# Patient Record
Sex: Female | Born: 1965
Health system: Southern US, Community
[De-identification: ages and names within clinical notes are randomized; demographics above are authoritative.]

## PROBLEM LIST (undated history)

## (undated) DIAGNOSIS — M109 Gout, unspecified: Secondary | ICD-10-CM

## (undated) DIAGNOSIS — I10 Essential (primary) hypertension: Secondary | ICD-10-CM

## (undated) DIAGNOSIS — K2900 Acute gastritis without bleeding: Secondary | ICD-10-CM

## (undated) DIAGNOSIS — R42 Dizziness and giddiness: Secondary | ICD-10-CM

## (undated) DIAGNOSIS — E119 Type 2 diabetes mellitus without complications: Secondary | ICD-10-CM

## (undated) DIAGNOSIS — E669 Obesity, unspecified: Secondary | ICD-10-CM

## (undated) DIAGNOSIS — G43909 Migraine, unspecified, not intractable, without status migrainosus: Secondary | ICD-10-CM

## (undated) HISTORY — PX: HEMORRHOID SURGERY: SHX153

## (undated) HISTORY — DX: Acute gastritis without bleeding: K29.00

---

## 1998-02-02 ENCOUNTER — Other Ambulatory Visit: Admission: RE | Admit: 1998-02-02 | Discharge: 1998-02-02 | Payer: Self-pay | Admitting: Family Medicine

## 1998-02-22 ENCOUNTER — Encounter: Admission: RE | Admit: 1998-02-22 | Discharge: 1998-02-22 | Payer: Self-pay | Admitting: Family Medicine

## 1998-05-08 ENCOUNTER — Encounter: Admission: RE | Admit: 1998-05-08 | Discharge: 1998-05-08 | Payer: Self-pay | Admitting: Sports Medicine

## 1998-05-10 ENCOUNTER — Encounter: Admission: RE | Admit: 1998-05-10 | Discharge: 1998-05-10 | Payer: Self-pay | Admitting: Family Medicine

## 1998-06-01 ENCOUNTER — Encounter: Admission: RE | Admit: 1998-06-01 | Discharge: 1998-06-01 | Payer: Self-pay | Admitting: Sports Medicine

## 1998-07-04 ENCOUNTER — Encounter: Admission: RE | Admit: 1998-07-04 | Discharge: 1998-07-04 | Payer: Self-pay | Admitting: Family Medicine

## 1998-07-10 ENCOUNTER — Encounter: Admission: RE | Admit: 1998-07-10 | Discharge: 1998-07-10 | Payer: Self-pay | Admitting: Sports Medicine

## 1998-07-14 ENCOUNTER — Emergency Department (HOSPITAL_COMMUNITY): Admission: EM | Admit: 1998-07-14 | Discharge: 1998-07-14 | Payer: Self-pay | Admitting: Emergency Medicine

## 1998-07-14 ENCOUNTER — Encounter: Payer: Self-pay | Admitting: Emergency Medicine

## 1998-07-24 ENCOUNTER — Encounter: Admission: RE | Admit: 1998-07-24 | Discharge: 1998-07-24 | Payer: Self-pay | Admitting: Family Medicine

## 1998-09-13 ENCOUNTER — Encounter: Admission: RE | Admit: 1998-09-13 | Discharge: 1998-09-13 | Payer: Self-pay | Admitting: Family Medicine

## 1999-05-23 ENCOUNTER — Encounter: Admission: RE | Admit: 1999-05-23 | Discharge: 1999-05-23 | Payer: Self-pay | Admitting: Family Medicine

## 1999-05-30 ENCOUNTER — Encounter: Admission: RE | Admit: 1999-05-30 | Discharge: 1999-05-30 | Payer: Self-pay | Admitting: Family Medicine

## 2000-02-25 ENCOUNTER — Encounter: Payer: Self-pay | Admitting: Emergency Medicine

## 2000-02-25 ENCOUNTER — Emergency Department (HOSPITAL_COMMUNITY): Admission: EM | Admit: 2000-02-25 | Discharge: 2000-02-25 | Payer: Self-pay | Admitting: Emergency Medicine

## 2000-07-06 ENCOUNTER — Other Ambulatory Visit: Admission: RE | Admit: 2000-07-06 | Discharge: 2000-07-06 | Payer: Self-pay | Admitting: *Deleted

## 2000-07-06 ENCOUNTER — Encounter: Admission: RE | Admit: 2000-07-06 | Discharge: 2000-07-06 | Payer: Self-pay | Admitting: Family Medicine

## 2000-07-20 ENCOUNTER — Encounter: Admission: RE | Admit: 2000-07-20 | Discharge: 2000-07-20 | Payer: Self-pay | Admitting: Family Medicine

## 2000-07-21 ENCOUNTER — Encounter: Payer: Self-pay | Admitting: *Deleted

## 2000-07-21 ENCOUNTER — Encounter: Admission: RE | Admit: 2000-07-21 | Discharge: 2000-07-21 | Payer: Self-pay | Admitting: *Deleted

## 2000-08-20 ENCOUNTER — Encounter: Admission: RE | Admit: 2000-08-20 | Discharge: 2000-08-20 | Payer: Self-pay | Admitting: Family Medicine

## 2000-09-08 ENCOUNTER — Encounter: Payer: Self-pay | Admitting: Emergency Medicine

## 2000-09-08 ENCOUNTER — Emergency Department (HOSPITAL_COMMUNITY): Admission: EM | Admit: 2000-09-08 | Discharge: 2000-09-08 | Payer: Self-pay | Admitting: Emergency Medicine

## 2000-09-15 ENCOUNTER — Ambulatory Visit (HOSPITAL_COMMUNITY): Admission: RE | Admit: 2000-09-15 | Discharge: 2000-09-15 | Payer: Self-pay

## 2000-09-15 ENCOUNTER — Encounter: Payer: Self-pay | Admitting: Family Medicine

## 2000-09-15 ENCOUNTER — Encounter: Admission: RE | Admit: 2000-09-15 | Discharge: 2000-09-15 | Payer: Self-pay | Admitting: Family Medicine

## 2000-09-21 ENCOUNTER — Encounter: Admission: RE | Admit: 2000-09-21 | Discharge: 2000-10-22 | Payer: Self-pay | Admitting: Sports Medicine

## 2000-10-14 ENCOUNTER — Encounter: Admission: RE | Admit: 2000-10-14 | Discharge: 2000-10-14 | Payer: Self-pay | Admitting: Family Medicine

## 2000-12-03 ENCOUNTER — Encounter: Admission: RE | Admit: 2000-12-03 | Discharge: 2000-12-03 | Payer: Self-pay | Admitting: Family Medicine

## 2000-12-08 ENCOUNTER — Encounter: Admission: RE | Admit: 2000-12-08 | Discharge: 2000-12-08 | Payer: Self-pay | Admitting: Family Medicine

## 2001-01-15 ENCOUNTER — Encounter: Admission: RE | Admit: 2001-01-15 | Discharge: 2001-01-15 | Payer: Self-pay | Admitting: Family Medicine

## 2001-03-30 ENCOUNTER — Encounter: Admission: RE | Admit: 2001-03-30 | Discharge: 2001-03-30 | Payer: Self-pay | Admitting: Sports Medicine

## 2001-04-05 ENCOUNTER — Emergency Department (HOSPITAL_COMMUNITY): Admission: EM | Admit: 2001-04-05 | Discharge: 2001-04-06 | Payer: Self-pay | Admitting: Emergency Medicine

## 2001-04-06 ENCOUNTER — Encounter: Payer: Self-pay | Admitting: Emergency Medicine

## 2001-04-21 ENCOUNTER — Encounter: Admission: RE | Admit: 2001-04-21 | Discharge: 2001-04-21 | Payer: Self-pay | Admitting: Family Medicine

## 2001-09-02 ENCOUNTER — Emergency Department (HOSPITAL_COMMUNITY): Admission: EM | Admit: 2001-09-02 | Discharge: 2001-09-03 | Payer: Self-pay | Admitting: Emergency Medicine

## 2001-09-08 ENCOUNTER — Encounter: Admission: RE | Admit: 2001-09-08 | Discharge: 2001-09-08 | Payer: Self-pay | Admitting: Family Medicine

## 2001-10-20 ENCOUNTER — Encounter: Admission: RE | Admit: 2001-10-20 | Discharge: 2001-10-20 | Payer: Self-pay | Admitting: Family Medicine

## 2001-11-18 ENCOUNTER — Encounter: Admission: RE | Admit: 2001-11-18 | Discharge: 2001-11-18 | Payer: Self-pay | Admitting: Family Medicine

## 2001-11-25 ENCOUNTER — Encounter: Admission: RE | Admit: 2001-11-25 | Discharge: 2001-11-25 | Payer: Self-pay | Admitting: Family Medicine

## 2002-01-10 ENCOUNTER — Other Ambulatory Visit: Admission: RE | Admit: 2002-01-10 | Discharge: 2002-01-10 | Payer: Self-pay | Admitting: Family Medicine

## 2002-01-10 ENCOUNTER — Encounter: Admission: RE | Admit: 2002-01-10 | Discharge: 2002-01-10 | Payer: Self-pay | Admitting: Family Medicine

## 2002-02-23 ENCOUNTER — Encounter: Admission: RE | Admit: 2002-02-23 | Discharge: 2002-02-23 | Payer: Self-pay | Admitting: Family Medicine

## 2002-03-02 ENCOUNTER — Encounter: Admission: RE | Admit: 2002-03-02 | Discharge: 2002-03-02 | Payer: Self-pay | Admitting: Sports Medicine

## 2002-03-03 ENCOUNTER — Encounter: Admission: RE | Admit: 2002-03-03 | Discharge: 2002-03-03 | Payer: Self-pay | Admitting: Family Medicine

## 2002-03-16 ENCOUNTER — Encounter: Admission: RE | Admit: 2002-03-16 | Discharge: 2002-03-16 | Payer: Self-pay | Admitting: Sports Medicine

## 2002-04-14 ENCOUNTER — Encounter: Admission: RE | Admit: 2002-04-14 | Discharge: 2002-04-14 | Payer: Self-pay | Admitting: Family Medicine

## 2002-07-06 ENCOUNTER — Encounter: Admission: RE | Admit: 2002-07-06 | Discharge: 2002-07-06 | Payer: Self-pay | Admitting: Family Medicine

## 2004-07-14 ENCOUNTER — Emergency Department (HOSPITAL_COMMUNITY): Admission: EM | Admit: 2004-07-14 | Discharge: 2004-07-14 | Payer: Self-pay | Admitting: Family Medicine

## 2004-07-17 ENCOUNTER — Ambulatory Visit: Payer: Self-pay | Admitting: Sports Medicine

## 2004-10-27 ENCOUNTER — Emergency Department (HOSPITAL_COMMUNITY): Admission: EM | Admit: 2004-10-27 | Discharge: 2004-10-27 | Payer: Self-pay | Admitting: Emergency Medicine

## 2004-10-28 ENCOUNTER — Emergency Department (HOSPITAL_COMMUNITY): Admission: EM | Admit: 2004-10-28 | Discharge: 2004-10-28 | Payer: Self-pay | Admitting: Emergency Medicine

## 2004-12-29 ENCOUNTER — Encounter (INDEPENDENT_AMBULATORY_CARE_PROVIDER_SITE_OTHER): Payer: Self-pay | Admitting: *Deleted

## 2004-12-29 LAB — CONVERTED CEMR LAB

## 2005-01-10 ENCOUNTER — Ambulatory Visit: Payer: Self-pay | Admitting: Family Medicine

## 2005-02-03 ENCOUNTER — Encounter: Admission: RE | Admit: 2005-02-03 | Discharge: 2005-02-03 | Payer: Self-pay | Admitting: Sports Medicine

## 2005-02-04 ENCOUNTER — Encounter: Admission: RE | Admit: 2005-02-04 | Discharge: 2005-02-04 | Payer: Self-pay | Admitting: Sports Medicine

## 2005-02-06 ENCOUNTER — Ambulatory Visit: Payer: Self-pay | Admitting: Family Medicine

## 2005-02-13 ENCOUNTER — Ambulatory Visit: Payer: Self-pay | Admitting: Family Medicine

## 2005-04-07 ENCOUNTER — Ambulatory Visit: Payer: Self-pay | Admitting: Family Medicine

## 2005-04-14 ENCOUNTER — Emergency Department (HOSPITAL_COMMUNITY): Admission: EM | Admit: 2005-04-14 | Discharge: 2005-04-14 | Payer: Self-pay | Admitting: Emergency Medicine

## 2006-07-23 DIAGNOSIS — M545 Low back pain: Secondary | ICD-10-CM

## 2006-07-23 DIAGNOSIS — E669 Obesity, unspecified: Secondary | ICD-10-CM

## 2006-07-23 DIAGNOSIS — G8929 Other chronic pain: Secondary | ICD-10-CM

## 2006-07-23 DIAGNOSIS — M25569 Pain in unspecified knee: Secondary | ICD-10-CM | POA: Insufficient documentation

## 2006-07-23 HISTORY — DX: Obesity, unspecified: E66.9

## 2006-07-23 HISTORY — DX: Other chronic pain: G89.29

## 2006-07-24 ENCOUNTER — Encounter (INDEPENDENT_AMBULATORY_CARE_PROVIDER_SITE_OTHER): Payer: Self-pay | Admitting: *Deleted

## 2006-08-10 ENCOUNTER — Telehealth (INDEPENDENT_AMBULATORY_CARE_PROVIDER_SITE_OTHER): Payer: Self-pay | Admitting: *Deleted

## 2006-11-07 ENCOUNTER — Emergency Department (HOSPITAL_COMMUNITY): Admission: EM | Admit: 2006-11-07 | Discharge: 2006-11-07 | Payer: Self-pay | Admitting: Emergency Medicine

## 2007-03-26 ENCOUNTER — Emergency Department (HOSPITAL_COMMUNITY): Admission: EM | Admit: 2007-03-26 | Discharge: 2007-03-26 | Payer: Self-pay | Admitting: Family Medicine

## 2007-03-30 ENCOUNTER — Ambulatory Visit: Payer: Self-pay | Admitting: Family Medicine

## 2007-03-30 ENCOUNTER — Telehealth: Payer: Self-pay | Admitting: *Deleted

## 2007-04-05 ENCOUNTER — Encounter (INDEPENDENT_AMBULATORY_CARE_PROVIDER_SITE_OTHER): Payer: Self-pay | Admitting: *Deleted

## 2007-04-28 ENCOUNTER — Encounter (INDEPENDENT_AMBULATORY_CARE_PROVIDER_SITE_OTHER): Payer: Self-pay | Admitting: *Deleted

## 2007-06-16 ENCOUNTER — Emergency Department (HOSPITAL_COMMUNITY): Admission: EM | Admit: 2007-06-16 | Discharge: 2007-06-16 | Payer: Self-pay | Admitting: Emergency Medicine

## 2007-07-21 ENCOUNTER — Ambulatory Visit: Payer: Self-pay | Admitting: Family Medicine

## 2007-07-28 ENCOUNTER — Ambulatory Visit: Payer: Self-pay | Admitting: Family Medicine

## 2007-08-04 ENCOUNTER — Encounter: Payer: Self-pay | Admitting: *Deleted

## 2007-08-21 ENCOUNTER — Emergency Department (HOSPITAL_COMMUNITY): Admission: EM | Admit: 2007-08-21 | Discharge: 2007-08-21 | Payer: Self-pay | Admitting: Emergency Medicine

## 2007-08-25 ENCOUNTER — Encounter: Payer: Self-pay | Admitting: *Deleted

## 2007-08-30 ENCOUNTER — Encounter (INDEPENDENT_AMBULATORY_CARE_PROVIDER_SITE_OTHER): Payer: Self-pay | Admitting: *Deleted

## 2007-08-30 ENCOUNTER — Ambulatory Visit: Payer: Self-pay | Admitting: Family Medicine

## 2007-08-30 ENCOUNTER — Ambulatory Visit (HOSPITAL_COMMUNITY): Admission: RE | Admit: 2007-08-30 | Discharge: 2007-08-30 | Payer: Self-pay | Admitting: Family Medicine

## 2007-08-30 DIAGNOSIS — I10 Essential (primary) hypertension: Secondary | ICD-10-CM

## 2007-08-30 HISTORY — DX: Essential (primary) hypertension: I10

## 2007-08-30 LAB — CONVERTED CEMR LAB
Bilirubin Urine: NEGATIVE
Chlamydia, DNA Probe: NEGATIVE
GC Probe Amp, Genital: NEGATIVE
Glucose, Urine, Semiquant: NEGATIVE
Ketones, urine, test strip: NEGATIVE
Nitrite: NEGATIVE
Protein, U semiquant: 30
RBC / HPF: >20
Specific Gravity, Urine: 1.025
Urobilinogen, UA: 0.2
Whiff Test: POSITIVE
pH: 5.5

## 2007-08-31 ENCOUNTER — Telehealth (INDEPENDENT_AMBULATORY_CARE_PROVIDER_SITE_OTHER): Payer: Self-pay | Admitting: *Deleted

## 2007-08-31 ENCOUNTER — Encounter (INDEPENDENT_AMBULATORY_CARE_PROVIDER_SITE_OTHER): Payer: Self-pay | Admitting: *Deleted

## 2007-09-01 ENCOUNTER — Encounter (INDEPENDENT_AMBULATORY_CARE_PROVIDER_SITE_OTHER): Payer: Self-pay | Admitting: *Deleted

## 2007-09-01 ENCOUNTER — Ambulatory Visit: Payer: Self-pay | Admitting: Family Medicine

## 2007-09-01 LAB — CONVERTED CEMR LAB
ALT: 25 units/L (ref 0–35)
AST: 18 units/L (ref 0–37)
Albumin: 4 g/dL (ref 3.5–5.2)
Alkaline Phosphatase: 71 units/L (ref 39–117)
BUN: 10 mg/dL (ref 6–23)
CO2: 26 meq/L (ref 19–32)
Calcium: 9.4 mg/dL (ref 8.4–10.5)
Chloride: 104 meq/L (ref 96–112)
Creatinine, Ser: 1.35 mg/dL — ABNORMAL HIGH (ref 0.40–1.20)
Glucose, Bld: 97 mg/dL (ref 70–99)
HCT: 41.3 % (ref 36.0–46.0)
Hemoglobin: 13.5 g/dL (ref 12.0–15.0)
Hepatitis B Surface Ag: NEGATIVE
MCHC: 32.7 g/dL (ref 30.0–36.0)
MCV: 86 fL (ref 78.0–100.0)
Platelets: 284 10*3/uL (ref 150–400)
Potassium: 4.3 meq/L (ref 3.5–5.3)
RBC: 4.8 M/uL (ref 3.87–5.11)
RDW: 13.2 % (ref 11.5–15.5)
Sodium: 141 meq/L (ref 135–145)
TSH: 0.978 microintl units/mL (ref 0.350–5.50)
Total Bilirubin: 0.5 mg/dL (ref 0.3–1.2)
Total Protein: 6.9 g/dL (ref 6.0–8.3)
WBC: 5.3 10*3/uL (ref 4.0–10.5)

## 2007-09-02 ENCOUNTER — Encounter (INDEPENDENT_AMBULATORY_CARE_PROVIDER_SITE_OTHER): Payer: Self-pay | Admitting: *Deleted

## 2007-09-06 ENCOUNTER — Encounter (INDEPENDENT_AMBULATORY_CARE_PROVIDER_SITE_OTHER): Payer: Self-pay | Admitting: *Deleted

## 2007-09-06 LAB — CONVERTED CEMR LAB
Cholesterol: 203 mg/dL — ABNORMAL HIGH (ref 0–200)
HDL: 55 mg/dL (ref >39)
LDL Cholesterol: 130 mg/dL — ABNORMAL HIGH (ref 0–99)
Pap Smear: NORMAL
Total CHOL/HDL Ratio: 3.7
Triglycerides: 91 mg/dL (ref <150)
VLDL: 18 mg/dL (ref 0–40)

## 2007-09-14 ENCOUNTER — Encounter: Payer: Self-pay | Admitting: *Deleted

## 2007-10-04 ENCOUNTER — Ambulatory Visit: Payer: Self-pay | Admitting: Family Medicine

## 2007-10-04 ENCOUNTER — Telehealth: Payer: Self-pay | Admitting: *Deleted

## 2007-10-12 ENCOUNTER — Ambulatory Visit (HOSPITAL_COMMUNITY): Admission: RE | Admit: 2007-10-12 | Discharge: 2007-10-12 | Payer: Self-pay | Admitting: *Deleted

## 2007-12-20 ENCOUNTER — Ambulatory Visit: Payer: Self-pay | Admitting: Family Medicine

## 2008-01-03 ENCOUNTER — Encounter: Admission: RE | Admit: 2008-01-03 | Discharge: 2008-01-03 | Payer: Self-pay | Admitting: Family Medicine

## 2008-01-04 ENCOUNTER — Telehealth: Payer: Self-pay | Admitting: *Deleted

## 2008-01-07 ENCOUNTER — Encounter (INDEPENDENT_AMBULATORY_CARE_PROVIDER_SITE_OTHER): Payer: Self-pay | Admitting: *Deleted

## 2008-01-21 ENCOUNTER — Telehealth: Payer: Self-pay | Admitting: *Deleted

## 2008-06-29 ENCOUNTER — Emergency Department (HOSPITAL_COMMUNITY): Admission: EM | Admit: 2008-06-29 | Discharge: 2008-06-29 | Payer: Self-pay | Admitting: Emergency Medicine

## 2008-06-29 ENCOUNTER — Telehealth: Payer: Self-pay | Admitting: Family Medicine

## 2008-07-04 ENCOUNTER — Ambulatory Visit: Payer: Self-pay | Admitting: Family Medicine

## 2008-07-04 DIAGNOSIS — R079 Chest pain, unspecified: Secondary | ICD-10-CM | POA: Insufficient documentation

## 2008-07-05 ENCOUNTER — Telehealth: Payer: Self-pay | Admitting: Family Medicine

## 2008-07-05 LAB — CONVERTED CEMR LAB
BUN: 18 mg/dL (ref 6–23)
CO2: 21 meq/L (ref 19–32)
Calcium: 9.6 mg/dL (ref 8.4–10.5)
Chloride: 107 meq/L (ref 96–112)
Creatinine, Ser: 1.42 mg/dL — ABNORMAL HIGH (ref 0.40–1.20)
Glucose, Bld: 97 mg/dL (ref 70–99)
Potassium: 4.3 meq/L (ref 3.5–5.3)
Sodium: 141 meq/L (ref 135–145)
TSH: 1.464 microintl units/mL (ref 0.350–4.50)

## 2008-07-11 ENCOUNTER — Encounter (INDEPENDENT_AMBULATORY_CARE_PROVIDER_SITE_OTHER): Payer: Self-pay | Admitting: Family Medicine

## 2008-07-11 ENCOUNTER — Ambulatory Visit: Payer: Self-pay | Admitting: Family Medicine

## 2008-07-11 ENCOUNTER — Telehealth: Payer: Self-pay | Admitting: Family Medicine

## 2008-07-11 DIAGNOSIS — G43009 Migraine without aura, not intractable, without status migrainosus: Secondary | ICD-10-CM

## 2008-07-11 DIAGNOSIS — N183 Chronic kidney disease, stage 3 unspecified: Secondary | ICD-10-CM

## 2008-07-11 DIAGNOSIS — G43109 Migraine with aura, not intractable, without status migrainosus: Secondary | ICD-10-CM

## 2008-07-11 HISTORY — DX: Chronic kidney disease, stage 3 unspecified: N18.30

## 2008-07-11 HISTORY — DX: Migraine without aura, not intractable, without status migrainosus: G43.009

## 2008-07-12 ENCOUNTER — Encounter (INDEPENDENT_AMBULATORY_CARE_PROVIDER_SITE_OTHER): Payer: Self-pay | Admitting: *Deleted

## 2008-08-07 ENCOUNTER — Encounter: Payer: Self-pay | Admitting: Family Medicine

## 2008-08-07 ENCOUNTER — Emergency Department (HOSPITAL_COMMUNITY): Admission: EM | Admit: 2008-08-07 | Discharge: 2008-08-08 | Payer: Self-pay | Admitting: Emergency Medicine

## 2008-08-10 ENCOUNTER — Ambulatory Visit: Payer: Self-pay | Admitting: Family Medicine

## 2008-08-10 DIAGNOSIS — R42 Dizziness and giddiness: Secondary | ICD-10-CM | POA: Insufficient documentation

## 2008-08-14 ENCOUNTER — Telehealth: Payer: Self-pay | Admitting: Family Medicine

## 2008-08-21 ENCOUNTER — Telehealth: Payer: Self-pay | Admitting: *Deleted

## 2008-09-07 ENCOUNTER — Telehealth: Payer: Self-pay | Admitting: *Deleted

## 2008-09-12 ENCOUNTER — Encounter: Payer: Self-pay | Admitting: Family Medicine

## 2008-09-27 ENCOUNTER — Telehealth: Payer: Self-pay | Admitting: *Deleted

## 2008-10-06 ENCOUNTER — Encounter: Payer: Self-pay | Admitting: *Deleted

## 2008-10-09 ENCOUNTER — Telehealth: Payer: Self-pay | Admitting: Family Medicine

## 2008-10-09 ENCOUNTER — Ambulatory Visit: Payer: Self-pay | Admitting: Family Medicine

## 2008-10-10 ENCOUNTER — Telehealth: Payer: Self-pay | Admitting: *Deleted

## 2008-10-11 ENCOUNTER — Ambulatory Visit: Payer: Self-pay | Admitting: Family Medicine

## 2008-11-15 ENCOUNTER — Telehealth: Payer: Self-pay | Admitting: *Deleted

## 2008-11-15 ENCOUNTER — Encounter: Payer: Self-pay | Admitting: Family Medicine

## 2009-05-15 ENCOUNTER — Emergency Department (HOSPITAL_COMMUNITY): Admission: EM | Admit: 2009-05-15 | Discharge: 2009-05-15 | Payer: Self-pay | Admitting: Emergency Medicine

## 2009-11-02 ENCOUNTER — Ambulatory Visit: Payer: Self-pay | Admitting: Family Medicine

## 2009-11-02 DIAGNOSIS — R21 Rash and other nonspecific skin eruption: Secondary | ICD-10-CM | POA: Insufficient documentation

## 2009-11-20 ENCOUNTER — Telehealth: Payer: Self-pay | Admitting: Family Medicine

## 2009-12-17 ENCOUNTER — Encounter: Payer: Self-pay | Admitting: *Deleted

## 2010-01-12 ENCOUNTER — Emergency Department (HOSPITAL_COMMUNITY): Admission: EM | Admit: 2010-01-12 | Discharge: 2010-01-12 | Payer: Self-pay | Admitting: Family Medicine

## 2010-01-14 ENCOUNTER — Encounter: Payer: Self-pay | Admitting: Family Medicine

## 2010-01-14 ENCOUNTER — Telehealth: Payer: Self-pay | Admitting: *Deleted

## 2010-01-14 ENCOUNTER — Ambulatory Visit: Payer: Self-pay | Admitting: Family Medicine

## 2010-01-14 DIAGNOSIS — R1011 Right upper quadrant pain: Secondary | ICD-10-CM | POA: Insufficient documentation

## 2010-01-14 LAB — CONVERTED CEMR LAB
Beta hcg, urine, semiquantitative: NEGATIVE
Bilirubin Urine: NEGATIVE
Epithelial cells, urine: >20 /lpf
Glucose, Urine, Semiquant: NEGATIVE
Ketones, urine, test strip: NEGATIVE
Nitrite: NEGATIVE
Protein, U semiquant: NEGATIVE
Specific Gravity, Urine: >=1.03
Urobilinogen, UA: 0.2
pH: 5.5

## 2010-01-15 LAB — CONVERTED CEMR LAB
ALT: 22 units/L (ref 0–35)
AST: 19 units/L (ref 0–37)
Albumin: 4.3 g/dL (ref 3.5–5.2)
Alkaline Phosphatase: 60 units/L (ref 39–117)
BUN: 14 mg/dL (ref 6–23)
Basophils Absolute: 0 10*3/uL (ref 0.0–0.1)
Basophils Relative: 0 % (ref 0–1)
CO2: 26 meq/L (ref 19–32)
Calcium: 9.3 mg/dL (ref 8.4–10.5)
Chloride: 104 meq/L (ref 96–112)
Creatinine, Ser: 1.18 mg/dL (ref 0.40–1.20)
Eosinophils Absolute: 0 10*3/uL (ref 0.0–0.7)
Eosinophils Relative: 1 % (ref 0–5)
Glucose, Bld: 101 mg/dL — ABNORMAL HIGH (ref 70–99)
HCT: 42.2 % (ref 36.0–46.0)
Hemoglobin: 14.2 g/dL (ref 12.0–15.0)
Lipase: 58 units/L (ref 0–75)
Lymphocytes Relative: 36 % (ref 12–46)
Lymphs Abs: 2.5 10*3/uL (ref 0.7–4.0)
MCHC: 33.6 g/dL (ref 30.0–36.0)
MCV: 83.9 fL (ref 78.0–100.0)
Monocytes Absolute: 0.5 10*3/uL (ref 0.1–1.0)
Monocytes Relative: 7 % (ref 3–12)
Neutro Abs: 3.9 10*3/uL (ref 1.7–7.7)
Neutrophils Relative %: 56 % (ref 43–77)
Platelets: 232 10*3/uL (ref 150–400)
Potassium: 4.2 meq/L (ref 3.5–5.3)
RBC: 5.03 M/uL (ref 3.87–5.11)
RDW: 13.5 % (ref 11.5–15.5)
Sodium: 138 meq/L (ref 135–145)
Total Bilirubin: 0.6 mg/dL (ref 0.3–1.2)
Total Protein: 6.8 g/dL (ref 6.0–8.3)
WBC: 6.9 10*3/uL (ref 4.0–10.5)

## 2010-01-16 ENCOUNTER — Ambulatory Visit (HOSPITAL_COMMUNITY): Admission: RE | Admit: 2010-01-16 | Discharge: 2010-01-16 | Payer: Self-pay | Admitting: Family Medicine

## 2010-01-17 ENCOUNTER — Encounter: Payer: Self-pay | Admitting: Family Medicine

## 2010-03-15 ENCOUNTER — Encounter: Payer: Self-pay | Admitting: Family Medicine

## 2010-06-17 ENCOUNTER — Encounter: Payer: Self-pay | Admitting: Family Medicine

## 2010-06-25 NOTE — Miscellaneous (Signed)
Summary: f/u from [facility] rec'd notice that pt had gone to UC. I called & she is still in pain. offered appt tomorrow with Dr. Sherilyn Cooter. she cannot come until next wk late in the day. will see Dr. Marguerita Merles at 4:15. to call for sooner appt if worse. she is taking ibu as needed as the oxycodone 5/325 is not helping enough.Marland KitchenMarland KitchenElige Radon RN  January 17, 2010 11:57 AM

## 2010-06-25 NOTE — Miscellaneous (Signed)
  Clinical Lists Changes  Problems: Changed problem from RENAL INSUFFICIENCY, CHRONIC (ICD-585.9) to CHRONIC KIDNEY DISEASE STAGE III (MODERATE) (ICD-585.3)

## 2010-06-25 NOTE — Assessment & Plan Note (Signed)
Summary: bp is up,tcb   Vital Signs:  Patient profile:   45 year old female Height:      69.25 inches Weight:      266 pounds BMI:     39.14 BSA:     2.34 Temp:     98.4 degrees F Pulse rate:   57 / minute BP sitting:   144 / 91  Vitals Entered By: Christen Bame CMA (November 02, 2009 11:23 AM) CC: headaches, itchy spots Is Patient Diabetic? No Pain Assessment Patient in pain? no        Primary Care Provider:  Patria Mane  MD  CC:  headaches and itchy spots.  History of Present Illness: headaches: has history of migraines.  started worsening about 1 week ago with daily occurrence typically starting between 2 and 4 pm.  unsure of trigger but she has been watching out for any. headaches often last for hours.  often she tries to sleep it off but has found herself tkaing tylenol and excedrin to try to help without much success.  denies weakness.  unsure if occasionally has blurred vision.  has started getting some aura (sparkles in peripheral vision).  typically at back of head but occasionally unilateral.  throbbing in nature.  worsened with stress for sure.  associated with nausea dn photo and phonophobias. has been taking her propranolol she reports without much improvement  itchy spots: also started about 1 wk ago.  goddaughter at home also has them.  only on arms.  total of 4 spots so far of which 3 are healed with darkened skin.  very itchy.  starts as small bump.  then will pus up and burst she reports leaving scabbing and then dark skin.  tried hydrocortisone and antifungal OTC creams without relief. tea tree did help with itching.  often is sitting on porch in the evenings.  denies spots anywhere else other than arms.   Habits & Providers  Alcohol-Tobacco-Diet     Tobacco Status: never  Current Medications (verified): 1)  Propranolol Hcl 10 Mg Tabs (Propranolol Hcl) .... One By Mouth Daily For Blood Pressure and Migraines 2)  Amitriptyline Hcl 25 Mg Tabs (Amitriptyline  Hcl) .... 1/2 Tab By Mouth At Bedtime For 1 Week Then 1 Tab By Mouth At Bedtime For Migraine Prevention 3)  Triamcinolone Acetonide 0.5 % Crea (Triamcinolone Acetonide) .... Apply To Itch Spots Two Times A Day Until 2-3 Days After Gone.  Disp 15g Tube  Allergies (verified): No Known Drug Allergies  Past History:  Past Medical History: h/o VERTIGO (ICD-780.4) RENAL INSUFFICIENCY, CHRONIC (ICD-585.9) HEADACHE (ICD-784.0), migraines ESSENTIAL HYPERTENSION, BENIGN (ICD-401.1) PATELLO FEMORAL STRESS SYNDROME (ICD-719.46) OBESITY, NOS (ICD-278.00) BACK PAIN, LOW (ICD-724.2) with Transitional L5-S1 vertebra on xray - 06/26/2000  Past Surgical History: hemorrhoidectomy 1995  Review of Systems       per HPI  Physical Exam  General:  alert, well-nourished, well-hydrated, and overweight-appearing.   VS noted - borderline HTN, bradycardia Head:  Normocephalic and atraumatic without obvious abnormalities Eyes:  No corneal or conjunctival inflammation noted. EOMI. Perrla. Funduscopic exam benign, without hemorrhages, exudates or papilledema. Vision grossly normal. Ears:  no external deformities.   Nose:  no external deformity.   Mouth:  Oral mucosa and oropharynx without lesions or exudates.  fair dentition Neck:  No deformities, masses, or tenderness noted. Lungs:  Normal respiratory effort, chest expands symmetrically. Lungs are clear to auscultation, no crackles or wheezes. Heart:  Normal rate and regular rhythm. S1 and S2 normal  without gallop, murmur, click, rub or other extra sounds. Neurologic:  alert & oriented X3 and gait normal.  CN II-XII intact. Speech normal. EOMI intact. strength 5/5 all extremities.  sensation intact to light touch. DTRs symmetrical and normal.  finger to nose normal.  romberg negative   Impression & Recommendations:  Problem # 1:  MIGRAINE WITH AURA (ICD-346.00) Assessment New  add amitryptilline (would have liked to have added topamax but cost issue) to  try to get control stop OTC analgesia. return if worsening or new symptoms such as weakness, numbness f/u 6 weeks  Her updated medication list for this problem includes:    Propranolol Hcl 10 Mg Tabs (Propranolol hcl) ..... One by mouth daily for blood pressure and migraines  Orders: [facility]- Est  Level 4 ([address]214)  Problem # 2:  RASH AND OTHER NONSPECIFIC SKIN ERUPTION (ICD-782.1) Assessment: New  suspect this may be insect bites while sitting on porch. trial of steroid cream, use insect repellent at night on porch return if worsens f/u in 6 wks  Her updated medication list for this problem includes:    Triamcinolone Acetonide 0.5 % Crea (Triamcinolone acetonide) .[name]... Apply to itch spots two times a day until 2-3 days after gone.  disp 15g tube  Orders: [facility]- Est  Level 4 ([address]214)  Complete Medication List: 1)  Propranolol Hcl 10 Mg Tabs (Propranolol hcl) .... One by mouth daily for blood pressure and migraines 2)  Amitriptyline Hcl 25 Mg Tabs (Amitriptyline hcl) .... 1/2 tab by mouth at bedtime for 1 week then 1 tab by mouth at bedtime for migraine prevention 3)  Triamcinolone Acetonide 0.5 % Crea (Triamcinolone acetonide) .... Apply to itch spots two times a day until 2-3 days after gone.  disp 15g tube  Patient Instructions: 1)  Please follow up in 1 month to see how things are going with headaches and blood pressure. 2)  Start the new medicine for migraine prevention.  it can make you sleep so take it at bedtime. 3)  I have also sent over a cream for the spots.  Get a good insect repellent to help as well. 4)  Stop using tylenol, excedrin, ibuprofen, etc for headaches - it can actaully when used that frequently make them worse.  Prescriptions: PROPRANOLOL HCL 10 MG TABS (PROPRANOLOL HCL) one by mouth daily for blood pressure and migraines  #30 x 3   Entered and Authorized by:   Patria Mane  MD   Signed by:   Patria Mane  MD on 11/02/2009   Method used:   Electronically to         [facility]. [phone]* (retail)       [address] [place]  [address]       Ph: [phone] or [phone]       Fax: [phone]   RxID:   [id number] TRIAMCINOLONE ACETONIDE 0.5 % CREA (TRIAMCINOLONE ACETONIDE) apply to itch spots two times a day until 2-3 days after gone.  Disp 15g tube  #15 x 1   Entered and Authorized by:   Patria Mane  MD   Signed by:   Patria Mane  MD on 11/02/2009   Method used:   Electronically to        [facility]. [phone]* (retail)       [address]       [place], [place]  [address]  Ph: [phone] or [phone]       Fax: [phone]   RxIDWV:[id number] AMITRIPTYLINE HCL 25 MG TABS (AMITRIPTYLINE HCL) 1/2 tab by mouth at bedtime for 1 week then 1 tab by mouth at bedtime for migraine prevention  #30 x 3   Entered and Authorized by:   Patria Mane  MD   Signed by:   Patria Mane  MD on 11/02/2009   Method used:   Electronically to        [facility]. [phone]* (retail)       [address] [place]  [address]       Ph: [phone] or [phone]       Fax: [phone]   RxID:   [id number]   Prevention & Chronic Care Immunizations   Influenza vaccine: Not documented    Tetanus booster: 03/28/2002: Done.   Tetanus booster due: 03/28/2012    Pneumococcal vaccine: Not documented  Other Screening   Pap smear: Normal  (09/06/2007)   Pap smear due: 09/05/2009    Mammogram: Normal  (10/13/2007)   Mammogram due: 10/12/2009   Smoking status: never  (11/02/2009)  Lipids   Total Cholesterol: 203  (09/02/2007)   LDL: 130  (09/02/2007)   LDL Direct: Not documented   HDL: 55  (09/02/2007)   Triglycerides: 91  (09/02/2007)  Hypertension   Last Blood Pressure: 144 / 91  (11/02/2009)   Serum creatinine: 1.42  (07/04/2008)   Serum potassium 4.3  (07/04/2008)    Hypertension flowsheet reviewed?: Yes   Progress toward BP goal: Unchanged  Self-Management Support :   Personal Goals  (by the next clinic visit) :      Personal blood pressure goal: 140/90  (11/02/2009)   Hypertension self-management support: Not documented    Hypertension self-management support not done because: Good outcomes  (11/02/2009)

## 2010-06-25 NOTE — Assessment & Plan Note (Signed)
Summary: RUQ pain/[place]/Blue team   Vital Signs:  Patient profile:   45 year old female Height:      69.25 inches Weight:      270 pounds BMI:     39.73 Temp:     98.7 degrees F oral Pulse rate:   57 / minute BP sitting:   129 / 87  (left arm) Cuff size:   large  Vitals Entered By: Levert Feinstein LPN (August 22, 624THL 10:29 AM) CC: rt side pain with nausea x 6 days Is Patient Diabetic? No Pain Assessment Patient in pain? yes     Location: right side Intensity: 9   Primary Care Provider:  Patria Mane  MD  CC:  rt side pain with nausea x 6 days.  History of Present Illness: 1) RUQ pain x 6 days: Intermittent RUQ pain x 6 days, worse after meals but unsure if association with type of meal. Pain is "deep and sharp and throbbing" and radiates through to her back on the right side and to her right scapula. Reports some nausea as well which started two nights ago, worse after meals as well as some epigastric fullness after meals. Reports decreased appetite but has had adequate fluid intake and is on BRAT diet. Last bowel movement was 2 days ago - had two small volume loose stools and one formed stool. Seen at Urgent Care on Saturday (concern for gall bladder disease, also diagnosed with right carpal tunnel syndrome incidentally). Pain is better with stretching right arm up and with lying down on left side. Worse with deep breathing, palpation of RUQ and with eating. Has tried ibuprofen 800 mg up to four times a day without relief. Denies prior abdominal surgery or history of gall bladder disease. LMP was July 29th.    ROS: Denies fever, chills, emesis, melena, hematochezia, constipation, vaginal discharge, dyspnea, rash, jaundice, change in color of stools, vaginal discharge or pain, abnormal vaginal bleeding,   See prior meds for med rec  Habits & Providers  Alcohol-Tobacco-Diet     Tobacco Status: never  Medications Prior to Update: 1)  Propranolol Hcl 10 Mg Tabs (Propranolol Hcl)  .... One By Mouth Daily For Blood Pressure and Migraines 2)  Amitriptyline Hcl 25 Mg Tabs (Amitriptyline Hcl) .... 1/2 Tab By Mouth At Bedtime For 1 Week Then 1 Tab By Mouth At Bedtime For Migraine Prevention 3)  Triamcinolone Acetonide 0.5 % Crea (Triamcinolone Acetonide) .... Apply To Itch Spots Two Times A Day Until 2-3 Days After Gone.  Disp 15g Tube  Allergies (verified): No Known Drug Allergies  Past History:  Past Surgical History: Last updated: 11/02/2009 hemorrhoidectomy 1995  Family History: Last updated: 08/30/2007 Breast CA in PGM`s sisters., Leukemia in PGM., No h/o blood clots in family. Father- HTN, high cholesterol Mom- Diabetic, anemia  Social History: Last updated: 08/30/2007 Degree as CNA.  In school at [employer] for criminal justice.  No EtoH, tob, drugs.  3 children (all grown).  Engaged (wedding may 2nd 2009).  Risk Factors: Smoking Status: never (01/14/2010)  Physical Exam  General:  obese, NAD, comfortable appearing   mild bradycardia, normotensive, afebrile Mouth:  moist membranes  Chest Wall:  Non tender to palpation.  Lungs:  Normal respiratory effort, chest expands symmetrically. Lungs are clear to auscultation, no crackles or wheezes. Heart:  Normal rate and regular rhythm. S1 and S2 normal without gallop, murmur, click, rub or other extra sounds. Abdomen:  soft, obese, mild tender at epigastrium, moderately tender at RUQ  but negative Murphy's sign. +ve bowel sounds, no masses, no rebound tenderness or guarding,  Msk:  pain not aggravated by abduction / adduction right arm  Pulses:  2+ radials, regular rate  Extremities:  well perfused,  Neurologic:  alert & oriented X3 and cranial nerves II-XII intact.   Skin:  warm and dry with good turgor    Impression & Recommendations:  Problem # 1:  ABDOMINAL PAIN, RIGHT UPPER QUADRANT (ICD-789.01) Assessment New Concering for gall bladder disease but w/o cholecystitis given negative Murphy's,  afebrile, VSS. Will check abdominal u/s to better elucidate cause of pain (maintain broad differential at this time though given symptomatology gall bladder disease likely) . Will check WBC, CMET, lipase. Treat pain and nausea with Percocet, Phenergan. Advised regarding avoind fatty foods. Red flags that would prompt return to care reviewed with patient including fever, emesis, worsening pain.  Will check Upreg, UA as well. Follow up in one week if not improving - would consider referral based on results of u/s at that  time.   Orders: Comp Met-[facility] [phone]) Lipase-[facility] [phone]) CBC w/Diff-[facility] (85025) Urinalysis-[facility] (00000) U Preg-[facility] (81025) Ultrasound (Ultrasound) [facility]- Est  Level 4 [id number])  Complete Medication List: 1)  Propranolol Hcl 10 Mg Tabs (Propranolol hcl) .... One by mouth daily for blood pressure and migraines 2)  Amitriptyline Hcl 25 Mg Tabs (Amitriptyline hcl) .... 1/2 tab by mouth at bedtime for 1 week then 1 tab by mouth at bedtime for migraine prevention 3)  Triamcinolone Acetonide 0.5 % Crea (Triamcinolone acetonide) .... Apply to itch spots two times a day until 2-3 days after gone.  disp 15g tube 4)  Oxycodone-acetaminophen 5-325 Mg Tabs (Oxycodone-acetaminophen) .... One tab by mouth q4 hrs as needed pain 5)  Promethazine Hcl 12.5 Mg Tabs (Promethazine hcl) .... One tab by mouth q6 hrs as needed nausea or vomiting  Patient Instructions: 1)  It was great to see you today!  2)  This pain sounds like it could be coming from your gall bladder.  3)  We will run some tests today - I also want to get an ultrasound to see if you have gallstones, so go to that appointment as scheduled 4)  Avoid fatty, fried, greasy, spicy or fast food 5)  Follow up in one week if not improving. 6)  I will give you some medication to help with pain and nausea.  Prescriptions: PROMETHAZINE HCL 12.5 MG TABS (PROMETHAZINE HCL) one tab by mouth q6 hrs as needed nausea or vomiting  #20 x 0    Entered and Authorized by:   Mariana Arn  MD   Signed by:   Mariana Arn  MD on 01/14/2010   Method used:   Print then Give to Patient   RxID:   [id number] OXYCODONE-ACETAMINOPHEN 5-325 MG TABS (OXYCODONE-ACETAMINOPHEN) one tab by mouth q4 hrs as needed pain  #20 x 0   Entered and Authorized by:   Mariana Arn  MD   Signed by:   Mariana Arn  MD on 01/14/2010   Method used:   Print then Give to Patient   RxID:   [id number]   Laboratory Results   Urine Tests  Date/Time Received: January 14, 2010 10:55 AM  Date/Time Reported: January 14, 2010 11:34 AM   Routine Urinalysis   Color: yellow Appearance: Clear Glucose: negative   (Normal Range: Negative) Bilirubin: negative   (Normal Range: Negative) Ketone: negative   (Normal Range: Negative) Spec. Gravity: >=1.030   (Normal Range: 1.003-1.035) Blood: moderate   (  Normal Range: Negative) pH: 5.5   (Normal Range: 5.0-8.0) Protein: negative   (Normal Range: Negative) Urobilinogen: 0.2   (Normal Range: 0-1) Nitrite: negative   (Normal Range: Negative) Leukocyte Esterace: trace   (Normal Range: Negative)  Urine Microscopic WBC/HPF: 1-5 RBC/HPF: 1-5 Bacteria/HPF: 3+ Epithelial/HPF: >20 Other: many trich present,  several clue cells present    Urine HCG: negative Comments: biochemical & u preg ............test performed by...........[name] Hedy Camara, CMA micro ...............test performed by......Marland KitchenBonnie A. Martinique, MLS (ASCP)cm      Appended Document: RUQ pain/[place]/Blue team  UA with clue cells, trich. will treat for both BV and trichomoniasis. Metronidazole as below to treat for both . Follow up as needed. Mariana Arn  MD  January 16, 2010 1:32 PM  Clinical Lists Changes  Medications: Added new medication of METRONIDAZOLE 500 MG TABS (METRONIDAZOLE) Four tabs by mouth on day one then 1 tab by mouth two times a day x 7 days - Signed Rx of METRONIDAZOLE 500 MG TABS (METRONIDAZOLE) Four tabs by mouth on day one then 1  tab by mouth two times a day x 7 days;  #18 x 0;  Signed;  Entered by: Mariana Arn  MD;  Authorized by: Mariana Arn  MD;  Method used: Electronically to [facility]. [phone]*, [address], [place], [place]  [address], Ph: [phone] or [phone], Fax: [phone]

## 2010-06-25 NOTE — Miscellaneous (Signed)
Summary: walk in  Clinical Lists Changes pt walked in c/o sore throat and a cough. states she had been to ED saturday & was put on prednisone x 3 days and given an albuterol inhaler. using tussin for the cough but is out & it is bothering her. declined to wait for workin today. made appt for 8:30am tomorrow. advised use of urgent care if she has difficulty breathing before appt. to use salt water gargles and ibu. to try otc robitussin dm. pt agrees with plan & states she thinks she will be ok until am.....................................................................[facility] Demmi Sindt RN  August 25, 2007 2:32 PM

## 2010-06-25 NOTE — Assessment & Plan Note (Signed)
Summary: knee pain/eo   Vital Signs:  Patient Profile:   45 Years Old Female Height:     69.25 inches Weight:      279.6 pounds BMI:     41.14 Temp:     97.2 degrees F oral Pulse rate:   62 / minute BP sitting:   141 / 90  (left arm) Cuff size:   regular  Pt. in pain?   yes    Location:   rt knee    Intensity:   9  Vitals Entered By: Levert Feinstein LPN (July 27, [id number] AM)                  PCP:  Patria Mane  MD  Chief Complaint:  knee and elbow pain.  History of Present Illness: CC: knee pain.  Pain initially stated on left knee after dancing, treated with RICE, ibuprofen Q6hours.  This improved.  Now pain more prominent on right knee, states has most difficulty going from sitting to standing and difficulty as well with stairs.  +shooting pain starting at knee going down leg and into thigh.  Has also tried Tylenol Arthritis two times a day.  No history of arthritis, but has been having pains for months.  Yesterday swelling of knee started, presents because pain worse now, throbbing.  Denies fever/chills,nausea, vomiting.  No locking of knee or giving out.    Current Allergies: No known allergies       Physical Exam  Msk:     Knee:  right knee with large effusion, no erythema, no warmth, + swelling, + crepitus, full ROM but painful, no joint instability.  difficulty with mcmurrays.  + pain with apley's maneuver.  full sensation, motor slightly diminished 2/2 pain.    Informed consent obtained - pt informed of risks and benefits of intervention, agreed to procede.  Landmarks verified, site cleaned with betadine and alcohol swab, then 1cc of Kenalog 40 with 5cc's of lidocaine injected into knee joint lateral to patellar tendon.  Pt tolerated procedure well, no blood loss.  Impression & Recommendations:  Problem # 1:  KNEE PAIN, RIGHT, ACUTE (ICD-719.46) Assessment: Deteriorated patellofemoral syndrome vs degenerative meniscal tear vs osteoarthritis  exacerbation.  Afebrile.  Advised pt continue to use ibuprofen/tylenol, RICE therapy.  Also provided with handout on patellofemoral syndrome and muscle strengthening exercises.  Asked pt to obtain xrays of knees to eval for arthritis.  If knees WNL, consider further imaging with MRI to r/o meniscal tear.  Although no h/o locking or knee giving way points against tear.  Treated with Kenalog40/lidocaine injection.  Advised of red flag of fever.  Her updated medication list for this problem includes:    Ibuprofen 800 Mg Tabs (Ibuprofen) ..... One by mouth three times daily with food x 14 days, then as needed  Orders: Diagnostic X-Ray/Fluoroscopy (Diagnostic X-Ray/Flu) [facility]- Est  Level 4 [id number])   Complete Medication List: 1)  Ibuprofen 800 Mg Tabs (Ibuprofen) .... One by mouth three times daily with food x 14 days, then as needed 2)  Spacer For Proair Hfa Mdi  3)  Enalapril-hydrochlorothiazide 5-12.5 Mg Tabs (Enalapril-hydrochlorothiazide) .[name].. 1 by mouth once daily   Patient Instructions: 1)  Please follow up in 2 weeks for knee pain if needed with Dr. Carlena Sax. 2)  Continue to take ibuprofen and tylenol, and RICE therapy. 3)  Please try the strengthening exercises for your knee. 4)  Try to stay off the leg for 24 hours after the injection. 5)  Call clinic with any questions.   ]

## 2010-06-25 NOTE — Miscellaneous (Signed)
Summary: rash  Clinical Lists Changes lives with granddaughter who was just released from hospital with a cellulitis & bacterial infection of skin. she thinks she is catching it & wants meds. told her she will need to be seen. appt made for tomorrow am..Curlee Bogan [facility] RN  December 17, 2009 11:13 AM

## 2010-06-25 NOTE — Progress Notes (Signed)
Summary: triage  Phone Note Call from Patient Call back at Home Phone [phone]   Caller: Patient Summary of Call: Breakout on skin. Initial call taken by: Raymond Gurney,  November 20, 2009 9:41 AM  Follow-up for Phone Call        states she is having more bites & she does not agree that they are mosquito or other insect bites. wants to have this rechecked. appt tomorrow at 3 with Dr. Sherilyn Cooter. this best fit  her schedule Follow-up by: Elige Radon RN,  November 20, 2009 10:06 AM

## 2010-06-25 NOTE — Assessment & Plan Note (Signed)
Summary: DNKA      Current Allergies: No known allergies         Complete Medication List: 1)  Ibuprofen 800 Mg Tabs (Ibuprofen) .... One by mouth three times daily with food x 14 days, then as needed 2)  Spacer For Proair Hfa Mdi  3)  Enalapril-hydrochlorothiazide 5-12.5 Mg Tabs (Enalapril-hydrochlorothiazide) .[name].. 1 by mouth once daily    ]

## 2010-06-25 NOTE — Progress Notes (Signed)
Summary: triage  Phone Note Call from Patient Call back at Home Phone [phone]   Caller: Patient Summary of Call: pt is c/o rt side pain, pierces thru to back/ nausea Initial call taken by: Audie Clear,  January 14, 2010 8:44 AM  Follow-up for Phone Call        went to [facility] saturday for this. they thought it was her gallbladder. she has been in pain all weekend. asked her to come iin now & we will see her.  Follow-up by: Elige Radon RN,  January 14, 2010 8:47 AM

## 2010-07-15 ENCOUNTER — Emergency Department (HOSPITAL_COMMUNITY): Payer: Self-pay

## 2010-07-15 ENCOUNTER — Emergency Department (HOSPITAL_COMMUNITY)
Admission: EM | Admit: 2010-07-15 | Discharge: 2010-07-16 | Disposition: A | Discharge status: Home / Self Care | Payer: Self-pay | Attending: Emergency Medicine | Admitting: Emergency Medicine

## 2010-07-15 DIAGNOSIS — E669 Obesity, unspecified: Secondary | ICD-10-CM | POA: Insufficient documentation

## 2010-07-15 DIAGNOSIS — I10 Essential (primary) hypertension: Secondary | ICD-10-CM | POA: Insufficient documentation

## 2010-07-15 DIAGNOSIS — R0789 Other chest pain: Secondary | ICD-10-CM | POA: Insufficient documentation

## 2010-07-15 LAB — CBC
HCT: 40.8 % (ref 36.0–46.0)
Hemoglobin: 14 g/dL (ref 12.0–15.0)
MCH: 28.6 pg (ref 26.0–34.0)
MCHC: 34.3 g/dL (ref 30.0–36.0)
MCV: 83.4 fL (ref 78.0–100.0)
Platelets: 224 10*3/uL (ref 150–400)
RBC: 4.89 MIL/uL (ref 3.87–5.11)
RDW: 12.9 % (ref 11.5–15.5)
WBC: 8.9 10*3/uL (ref 4.0–10.5)

## 2010-07-15 LAB — DIFFERENTIAL
Basophils Absolute: 0 10*3/uL (ref 0.0–0.1)
Basophils Relative: 0 % (ref 0–1)
Eosinophils Absolute: 0.1 10*3/uL (ref 0.0–0.7)
Eosinophils Relative: 1 % (ref 0–5)
Lymphocytes Relative: 43 % (ref 12–46)
Lymphs Abs: 3.8 10*3/uL (ref 0.7–4.0)
Monocytes Absolute: 0.8 10*3/uL (ref 0.1–1.0)
Monocytes Relative: 9 % (ref 3–12)
Neutro Abs: 4.2 10*3/uL (ref 1.7–7.7)
Neutrophils Relative %: 47 % (ref 43–77)

## 2010-07-15 LAB — POCT CARDIAC MARKERS
CKMB, poc: 1 ng/mL (ref 1.0–8.0)
Myoglobin, poc: 172 ng/mL (ref 12–200)
Troponin i, poc: <0.05 ng/mL (ref 0.00–0.09)

## 2010-07-16 LAB — BASIC METABOLIC PANEL
BUN: 18 mg/dL (ref 6–23)
CO2: 25 mEq/L (ref 19–32)
Calcium: 9.2 mg/dL (ref 8.4–10.5)
Chloride: 106 mEq/L (ref 96–112)
Creatinine, Ser: 1.58 mg/dL — ABNORMAL HIGH (ref 0.4–1.2)
GFR calc Af Amer: 43 mL/min — ABNORMAL LOW (ref >60)
GFR calc non Af Amer: 35 mL/min — ABNORMAL LOW (ref >60)
Glucose, Bld: 102 mg/dL — ABNORMAL HIGH (ref 70–99)
Potassium: 3.8 mEq/L (ref 3.5–5.1)
Sodium: 139 mEq/L (ref 135–145)

## 2010-07-17 ENCOUNTER — Encounter: Payer: Self-pay | Admitting: Family Medicine

## 2010-07-17 ENCOUNTER — Ambulatory Visit (INDEPENDENT_AMBULATORY_CARE_PROVIDER_SITE_OTHER): Payer: Self-pay | Admitting: Family Medicine

## 2010-07-17 VITALS — BP 141/104 | HR 66 | Temp 98.5°F | Ht 69.25 in | Wt 277.8 lb

## 2010-07-17 DIAGNOSIS — R079 Chest pain, unspecified: Secondary | ICD-10-CM

## 2010-07-17 DIAGNOSIS — I10 Essential (primary) hypertension: Secondary | ICD-10-CM

## 2010-07-17 MED ORDER — HYDROCHLOROTHIAZIDE 25 MG PO TABS: 25.0000 mg | ORAL_TABLET | Freq: Every day | ORAL | Status: DC

## 2010-07-17 MED ORDER — DICLOFENAC POTASSIUM 50 MG PO TABS: 50.0000 mg | ORAL_TABLET | Freq: Three times a day (TID) | ORAL | Status: DC | PRN

## 2010-07-17 NOTE — Assessment & Plan Note (Signed)
Not well controlled.  Not currently taking medications.  Has been on norvasc in past and Propranolol 10 mg once a day was recently prescribed.  Never taken diuretic.  Will start and followup.  Discussed risks of CVA

## 2010-07-17 NOTE — Patient Instructions (Addendum)
I think you have costrochodritis Take the diclofenac up to 3 x a day with food when the pain is bad Use warm compresses on your chest If you get severe shortness of breath or vomiting or diarrhea call us For your blood pressure Stop the Propranolol Start Hydrochlorthiazide HCTZ once a day Check your blood pressure at a store every 2-3 days and write down and bring in next visit

## 2010-07-17 NOTE — Assessment & Plan Note (Addendum)
Seems most consistent with  Costrochondritis.   No signs or symptoms of cardiac disease or dissection or PE or reflux.   Will treat with PRN nsaid and follow

## 2010-07-17 NOTE — Progress Notes (Signed)
  Subjective:    Patient ID: Heidi Rivera, female    DOB: 05/10/1966, 45 y.o.   MRN: [id number]  HPI  CHEST PAIN  Location: front of chest Quality: shooting aching Duration: constant  Onset (rest, exertion): worse with movement or deep breath Radiation: to back Better with: not moving Worse with: moving   Percocet did not seem to help much  Symptoms History of Trauma/lifting: no  Nausea/vomiting: no  Diaphoresis: no  Shortness of breath: yes, sometimes when lays down feels mildly shortness of breath   Pleuritic: yes  Cough: no  Edema: no  Orthopnea: no  PND: no  Dizziness: no  Palpitations: no  Syncope: no  Indigestion: no   Red Flags Worse with exertion: no  Recent Immobility: no  Cancer history: no    PMH Seen in ER 2/21 I reviewed the note  Had unremarkablel CXR, EKG, CBC, Bmet (Crt 1.5), cardiac enzymes Given #20 hydrocodone Had abd [place] in 8/11 - normal    Review of Systems see HPI    Objective:   Physical Exam    Heart:  Normal rate and regular rhythm. S1 and S2 normal without gallop, murmur, click, rub or other extra sounds  Lungs:  Normal respiratory effort, chest expands symmetrically. Lungs are clear to auscultation, no crackles or wheezes. Extremities:  No cyanosis, edema, or deformity noted with good range of motion of all major joints.   Abdomen - abdomen soft and non-tender without masses, organomegaly or hernias noted.  No guarding or rebound.  Chest wall - tender to palpation over lateral sternum.  No masses. Worse with resisted arm movement Skin - no rash     Assessment & Plan:

## 2010-08-07 ENCOUNTER — Ambulatory Visit: Payer: Self-pay | Admitting: Family Medicine

## 2010-08-26 LAB — URINALYSIS, ROUTINE W REFLEX MICROSCOPIC
Bilirubin Urine: NEGATIVE
Glucose, UA: NEGATIVE mg/dL
Ketones, ur: NEGATIVE mg/dL
Nitrite: NEGATIVE
Protein, ur: NEGATIVE mg/dL
Specific Gravity, Urine: 1.022 (ref 1.005–1.030)
Urobilinogen, UA: 1 mg/dL (ref 0.0–1.0)
pH: 5.5 (ref 5.0–8.0)

## 2010-08-26 LAB — POCT PREGNANCY, URINE: Preg Test, Ur: NEGATIVE

## 2010-08-26 LAB — URINE MICROSCOPIC-ADD ON

## 2010-09-05 LAB — DIFFERENTIAL
Basophils Absolute: 0.1 10*3/uL (ref 0.0–0.1)
Basophils Relative: 1 % (ref 0–1)
Eosinophils Absolute: 0.1 10*3/uL (ref 0.0–0.7)
Eosinophils Relative: 1 % (ref 0–5)
Lymphocytes Relative: 34 % (ref 12–46)
Lymphs Abs: 2.6 10*3/uL (ref 0.7–4.0)
Monocytes Absolute: 0.5 10*3/uL (ref 0.1–1.0)
Monocytes Relative: 7 % (ref 3–12)
Neutro Abs: 4.4 10*3/uL (ref 1.7–7.7)
Neutrophils Relative %: 58 % (ref 43–77)

## 2010-09-05 LAB — POCT I-STAT, CHEM 8
BUN: 12 mg/dL (ref 6–23)
Calcium, Ion: 1.22 mmol/L (ref 1.12–1.32)
Chloride: 102 mEq/L (ref 96–112)
Creatinine, Ser: 1.6 mg/dL — ABNORMAL HIGH (ref 0.4–1.2)
Glucose, Bld: 90 mg/dL (ref 70–99)
HCT: 43 % (ref 36.0–46.0)
Hemoglobin: 14.6 g/dL (ref 12.0–15.0)
Potassium: 4 mEq/L (ref 3.5–5.1)
Sodium: 140 mEq/L (ref 135–145)
TCO2: 29 mmol/L (ref 0–100)

## 2010-09-05 LAB — URINALYSIS, ROUTINE W REFLEX MICROSCOPIC
Bilirubin Urine: NEGATIVE
Glucose, UA: NEGATIVE mg/dL
Ketones, ur: NEGATIVE mg/dL
Nitrite: NEGATIVE
Protein, ur: NEGATIVE mg/dL
Specific Gravity, Urine: 1.024 (ref 1.005–1.030)
Urobilinogen, UA: 0.2 mg/dL (ref 0.0–1.0)
pH: 6 (ref 5.0–8.0)

## 2010-09-05 LAB — POCT CARDIAC MARKERS
CKMB, poc: <1 ng/mL — ABNORMAL LOW (ref 1.0–8.0)
Myoglobin, poc: 79.7 ng/mL (ref 12–200)
Troponin i, poc: <0.05 ng/mL (ref 0.00–0.09)

## 2010-09-05 LAB — URINE MICROSCOPIC-ADD ON

## 2010-09-05 LAB — CBC
HCT: 41.6 % (ref 36.0–46.0)
Hemoglobin: 13.9 g/dL (ref 12.0–15.0)
MCHC: 33.4 g/dL (ref 30.0–36.0)
MCV: 86.3 fL (ref 78.0–100.0)
Platelets: 224 10*3/uL (ref 150–400)
RBC: 4.82 MIL/uL (ref 3.87–5.11)
RDW: 13.4 % (ref 11.5–15.5)
WBC: 7.7 10*3/uL (ref 4.0–10.5)

## 2010-09-10 LAB — CBC
HCT: 40.2 % (ref 36.0–46.0)
Hemoglobin: 13.6 g/dL (ref 12.0–15.0)
MCHC: 33.8 g/dL (ref 30.0–36.0)
MCV: 86.5 fL (ref 78.0–100.0)
Platelets: 197 10*3/uL (ref 150–400)
RBC: 4.65 MIL/uL (ref 3.87–5.11)
RDW: 13.4 % (ref 11.5–15.5)
WBC: 6 10*3/uL (ref 4.0–10.5)

## 2010-09-10 LAB — POCT CARDIAC MARKERS
CKMB, poc: <1 ng/mL — ABNORMAL LOW (ref 1.0–8.0)
Myoglobin, poc: 86.3 ng/mL (ref 12–200)
Troponin i, poc: <0.05 ng/mL (ref 0.00–0.09)

## 2010-09-10 LAB — DIFFERENTIAL
Basophils Absolute: 0 10*3/uL (ref 0.0–0.1)
Basophils Relative: 1 % (ref 0–1)
Eosinophils Absolute: 0 10*3/uL (ref 0.0–0.7)
Eosinophils Relative: 1 % (ref 0–5)
Lymphocytes Relative: 38 % (ref 12–46)
Lymphs Abs: 2.3 10*3/uL (ref 0.7–4.0)
Monocytes Absolute: 0.4 10*3/uL (ref 0.1–1.0)
Monocytes Relative: 7 % (ref 3–12)
Neutro Abs: 3.2 10*3/uL (ref 1.7–7.7)
Neutrophils Relative %: 53 % (ref 43–77)

## 2010-09-10 LAB — POCT I-STAT, CHEM 8
BUN: 12 mg/dL (ref 6–23)
Calcium, Ion: 1.22 mmol/L (ref 1.12–1.32)
Chloride: 104 mEq/L (ref 96–112)
Creatinine, Ser: 1.8 mg/dL — ABNORMAL HIGH (ref 0.4–1.2)
Glucose, Bld: 84 mg/dL (ref 70–99)
HCT: 42 % (ref 36.0–46.0)
Hemoglobin: 14.3 g/dL (ref 12.0–15.0)
Potassium: 3.9 mEq/L (ref 3.5–5.1)
Sodium: 140 mEq/L (ref 135–145)
TCO2: 27 mmol/L (ref 0–100)

## 2011-01-17 ENCOUNTER — Other Ambulatory Visit: Payer: Self-pay | Admitting: Family Medicine

## 2011-01-17 DIAGNOSIS — I10 Essential (primary) hypertension: Secondary | ICD-10-CM

## 2011-05-13 ENCOUNTER — Encounter (HOSPITAL_COMMUNITY): Payer: Self-pay | Admitting: Emergency Medicine

## 2011-05-13 ENCOUNTER — Emergency Department (INDEPENDENT_AMBULATORY_CARE_PROVIDER_SITE_OTHER)
Admission: EM | Admit: 2011-05-13 | Discharge: 2011-05-13 | Disposition: A | Discharge status: Home / Self Care | Payer: Self-pay | Source: Home / Self Care | Attending: Emergency Medicine | Admitting: Emergency Medicine

## 2011-05-13 DIAGNOSIS — S4980XA Other specified injuries of shoulder and upper arm, unspecified arm, initial encounter: Secondary | ICD-10-CM

## 2011-05-13 DIAGNOSIS — S46909A Unspecified injury of unspecified muscle, fascia and tendon at shoulder and upper arm level, unspecified arm, initial encounter: Secondary | ICD-10-CM

## 2011-05-13 DIAGNOSIS — S46009A Unspecified injury of muscle(s) and tendon(s) of the rotator cuff of unspecified shoulder, initial encounter: Secondary | ICD-10-CM

## 2011-05-13 HISTORY — DX: Dizziness and giddiness: R42

## 2011-05-13 HISTORY — DX: Essential (primary) hypertension: I10

## 2011-05-13 HISTORY — DX: Migraine, unspecified, not intractable, without status migrainosus: G43.909

## 2011-05-13 HISTORY — DX: Obesity, unspecified: E66.9

## 2011-05-13 MED ORDER — SALSALATE 750 MG PO TABS: 750.0000 mg | ORAL_TABLET | Freq: Two times a day (BID) | ORAL | Status: DC

## 2011-05-13 MED ORDER — HYDROCODONE-ACETAMINOPHEN 5-325 MG PO TABS: 2.0000 | ORAL_TABLET | ORAL | Status: DC | PRN

## 2011-05-13 NOTE — ED Provider Notes (Signed)
History     CSN: [id number] Arrival date & time: 05/13/2011  2:16 PM   First MD Initiated Contact with Patient 05/13/11 1353      Chief Complaint  Patient presents with  . Shoulder Pain  . Rash    HPI Comments: Pt states she fell 1 month ago landing on her buttocks. Had both arm behind her fully outstretched to break her fall. Since then c/o dull achy R shoulder pain worse at night. Using heating pad with some help. Unable to fully abduct shoulder secondary to pain, no parasthesias, weakness, bruising, deformity. No remote h/o injury to this shoulder or neck. Tried advil 800 mg last night w/o relief.   Patient is a 45 y.o. female presenting with shoulder pain. The history is provided by the patient. No language interpreter was used.  Shoulder Pain The current episode started more than 1 week ago. The problem has been gradually worsening. The symptoms are relieved by nothing. Treatments tried: ibu 800 mg. The treatment provided no relief.    Past Medical History  Diagnosis Date  . Hypertension   . Vertigo   . Migraines   . Obesity     Past Surgical History  Procedure Date  . Hemorrhoid surgery     History reviewed. No pertinent family history.  History  Substance Use Topics  . Smoking status: Never Smoker   . Smokeless tobacco: Not on file  . Alcohol Use: Yes    OB History    Grav Para Term Preterm Abortions TAB SAB Ect Mult Living                  Review of Systems  Constitutional: Negative for fever.  Musculoskeletal: Positive for arthralgias. Negative for back pain and joint swelling.  Skin: Negative for color change and wound.  Neurological: Negative for weakness and numbness.    Allergies  Review of patient's allergies indicates no known allergies.  Home Medications   Current Outpatient Rx  Name Route Sig Dispense Refill  . HYDROCHLOROTHIAZIDE 25 MG PO TABS  TAKE 1 TABLET BY MOUTH EVERY DAY 30 tablet 1    Please ask patient to make an appointment  for addi ...  . HYDROCODONE-ACETAMINOPHEN 5-325 MG PO TABS Oral Take 2 tablets by mouth every 4 (four) hours as needed for pain. 20 tablet 0  . SALSALATE 750 MG PO TABS Oral Take 1 tablet (750 mg total) by mouth 2 (two) times daily. 14 tablet 0    BP 153/98  Pulse 60  Temp(Src) 98.6 F (37 C) (Oral)  Resp 18  SpO2 100%  LMP 04/15/2011  Physical Exam  Nursing note and vitals reviewed. Constitutional: She is oriented to person, place, and time. She appears well-developed and well-nourished. No distress.  HENT:  Head: Normocephalic and atraumatic.  Eyes: EOM are normal. Pupils are equal, round, and reactive to light.  Neck: Normal range of motion.  Cardiovascular: Regular rhythm.   Pulmonary/Chest: Effort normal and breath sounds normal.  Abdominal: She exhibits no distension.  Musculoskeletal: Normal range of motion.       R shoulder with ROM  somewhat limited, Drop test  painful but negative,  clavicle NT, A/C joint  tender, scapula NT, proximal humerus NT, Motor strength decreased due to pain. (+) empty can. Unable to fully abduct arm over head due to pain. (+) lift off test. RP 2+. Sensation intact LT over deltoid region, distal NVI with hand on affected side having intact sensation and strength in the  distribution of the median, radial, and ulnar nerve.    Neurological: She is alert and oriented to person, place, and time.  Skin: Skin is warm and dry.  Psychiatric: She has a normal mood and affect. Her behavior is normal. Judgment and thought content normal.    ED Course  Procedures (including critical care time)  Labs Reviewed - No data to display No results found.   1. Rotator cuff injury       MDM     Cherly Beach, MD 05/13/11 [phone]

## 2011-05-13 NOTE — ED Notes (Signed)
Pt here with c/o right shoulder pain s/p fall down steps x1 mnth ago but has progressed to increased pain with sleeping position and use of hands in 2 wks.pt never was seen for injury but states i tried heat,ice and pain meds but none working.pt unable to fully flex arm.pt also c/o rash that started on back in few places but has spread to stomach.itchy but nonraised

## 2011-05-18 ENCOUNTER — Encounter (HOSPITAL_COMMUNITY): Payer: Self-pay | Admitting: *Deleted

## 2011-05-18 ENCOUNTER — Emergency Department (HOSPITAL_COMMUNITY)
Admission: EM | Admit: 2011-05-18 | Discharge: 2011-05-18 | Disposition: A | Discharge status: Home / Self Care | Payer: Self-pay | Attending: Emergency Medicine | Admitting: Emergency Medicine

## 2011-05-18 DIAGNOSIS — R21 Rash and other nonspecific skin eruption: Secondary | ICD-10-CM | POA: Insufficient documentation

## 2011-05-18 DIAGNOSIS — R209 Unspecified disturbances of skin sensation: Secondary | ICD-10-CM | POA: Insufficient documentation

## 2011-05-18 DIAGNOSIS — B86 Scabies: Secondary | ICD-10-CM | POA: Insufficient documentation

## 2011-05-18 DIAGNOSIS — G5601 Carpal tunnel syndrome, right upper limb: Secondary | ICD-10-CM

## 2011-05-18 DIAGNOSIS — G5600 Carpal tunnel syndrome, unspecified upper limb: Secondary | ICD-10-CM | POA: Insufficient documentation

## 2011-05-18 DIAGNOSIS — M25519 Pain in unspecified shoulder: Secondary | ICD-10-CM | POA: Insufficient documentation

## 2011-05-18 MED ORDER — PERMETHRIN 5 % EX CREA: TOPICAL_CREAM | CUTANEOUS | Status: AC

## 2011-05-18 NOTE — ED Notes (Signed)
Reports going to [facility] last week for right shoulder pain,. Was told that its a rotator cuff injury, having increase in pain and decrease rom to arm. Also has rash to lower back that she wants checked.

## 2011-05-18 NOTE — ED Provider Notes (Signed)
History     CSN: [id number]  Arrival date & time 05/18/11  1116   First MD Initiated Contact with Patient 05/18/11 1255      Chief Complaint  Patient presents with  . Shoulder Pain  . Rash    (Consider location/radiation/quality/duration/timing/severity/associated sxs/prior treatment) HPI Patient is a 45 year old female who was seen last week at urgent care for right shoulder pain as well as a rash. Today she presents complaining of development of numbness in the right hand as well as continued rash on her lower back it is now spread to her legs and buttocks. Patient does not know of anyone else in the home who has this rash. She has been allergic exposures. She does have linear burrows noted over her body. She has had scabies before and says this does feel similarly. Patient does not have any worsening of her shoulder pain and actually has a pain medication prescription she is going to pick up after this appointment. She was concerned she was having hand numbness. This is definitely from the wrist distally. Patient does agree that could be carpal tunnel like she has had in the past. It is made worse with movement. Nothing has made it better. There are no other associated or modifying factors. Past Medical History  Diagnosis Date  . Hypertension   . Vertigo   . Migraines   . Obesity     Past Surgical History  Procedure Date  . Hemorrhoid surgery     History reviewed. No pertinent family history.  History  Substance Use Topics  . Smoking status: Never Smoker   . Smokeless tobacco: Not on file  . Alcohol Use: Yes    OB History    Grav Para Term Preterm Abortions TAB SAB Ect Mult Living                  Review of Systems  Constitutional: Negative.   HENT: Negative.   Eyes: Negative.   Respiratory: Negative.   Cardiovascular: Negative.   Gastrointestinal: Negative.   Genitourinary: Negative.   Musculoskeletal: Positive for arthralgias.  Skin: Positive for rash.    Neurological: Positive for numbness.  Hematological: Negative.   Psychiatric/Behavioral: Negative.   All other systems reviewed and are negative.    Allergies  Review of patient's allergies indicates no known allergies.  Home Medications   Current Outpatient Rx  Name Route Sig Dispense Refill  . ACETAMINOPHEN 500 MG PO TABS Oral Take 1,000 mg by mouth every 6 (six) hours as needed. For pain     . DIPHENHYDRAMINE HCL (SLEEP) 25 MG PO TABS Oral Take 50 mg by mouth daily as needed. For itching.     [name] HYDROCHLOROTHIAZIDE 25 MG PO TABS Oral Take 25 mg by mouth daily.      [name] HYDROCORTISONE 1 % EX CREA Topical Apply 1 application topically 2 (two) times daily. For rash     . HYDROCODONE-ACETAMINOPHEN 5-325 MG PO TABS Oral Take 2 tablets by mouth every 4 (four) hours as needed. For pain    . PERMETHRIN 5 % EX CREA  Apply to affected area once 60 g 1    BP 125/90  Pulse 68  Temp 98.3 F (36.8 C)  Resp 20  SpO2 97%  LMP 04/15/2011  Physical Exam  Nursing note and vitals reviewed. Constitutional: She is oriented to person, place, and time. She appears well-developed and well-nourished. No distress.  HENT:  Head: Normocephalic and atraumatic.  Eyes: Conjunctivae and EOM are normal.  Pupils are equal, round, and reactive to light.  Neck: Normal range of motion.  Abdominal: Soft. Bowel sounds are normal. She exhibits no distension. There is no tenderness. There is no rebound and no guarding.  Musculoskeletal:       Decreased range of motion in the right shoulder. Patient has numbness reproduced by tapping over the volar surface of the right wrist. This is present only in the right hand in the median nerve distribution.  Neurological: She is alert and oriented to person, place, and time. No cranial nerve deficit. She exhibits normal muscle tone. Coordination normal.  Skin: Skin is warm and dry.       Patient has linear burrows typical scabies noted over her posterior legs, buttocks, and  lower back    ED Course  Procedures (including critical care time)  Labs Reviewed - No data to display No results found.   1. Scabies   2. Carpal tunnel syndrome of right wrist       MDM  Patient's presentation was consistent with right carpal tunnel syndrome of the wrist as well as TB. She was given a prescription for Elimite. Patient is familiar with treatment with this medication. She already has pain medication for her right shoulder. She also has a wrist splint at home for carpal tunnel syndrome. She was comfortable with plan and was discharged home with a prescription for Elimite.        Chauncy Passy, MD 05/18/11 1325

## 2011-06-04 ENCOUNTER — Encounter: Payer: Self-pay | Admitting: Family Medicine

## 2011-06-04 ENCOUNTER — Ambulatory Visit (INDEPENDENT_AMBULATORY_CARE_PROVIDER_SITE_OTHER): Payer: Self-pay | Admitting: Family Medicine

## 2011-06-04 VITALS — BP 138/89 | HR 60 | Ht 69.5 in | Wt 280.0 lb

## 2011-06-04 DIAGNOSIS — S46009A Unspecified injury of muscle(s) and tendon(s) of the rotator cuff of unspecified shoulder, initial encounter: Secondary | ICD-10-CM

## 2011-06-04 DIAGNOSIS — S4980XA Other specified injuries of shoulder and upper arm, unspecified arm, initial encounter: Secondary | ICD-10-CM

## 2011-06-04 DIAGNOSIS — S46909A Unspecified injury of unspecified muscle, fascia and tendon at shoulder and upper arm level, unspecified arm, initial encounter: Secondary | ICD-10-CM

## 2011-06-04 MED ORDER — METHYLPREDNISOLONE ACETATE 40 MG/ML IJ SUSP: 40.0000 mg | Freq: Once | INTRAMUSCULAR | Status: DC

## 2011-06-04 MED ORDER — MELOXICAM 15 MG PO TABS: 15.0000 mg | ORAL_TABLET | Freq: Every day | ORAL | Status: AC

## 2011-06-04 NOTE — Progress Notes (Signed)
  Subjective:    Patient ID: Heidi Rivera, female    DOB: [birth year], 46 y.o.   MRN: [id number]  HPI 46 y/o female fell 2 months ago and has had progressive right shoulder pain since.  Worse with over head movements.  No neck pain.  No pain into the arms.  She gets achy pain at night and cannot sleep on this shoulder.  No previous injury.  Tried oral meds without improvement.   Review of Systems     Objective:   Physical Exam  Shoulder: Inspection reveals no abnormalities, atrophy or asymmetry. Palpation is normal with no tenderness over AC joint or bicipital groove. FF 100 active, 180 passive Abduction 80 degrees active, 180 passive External rotation 90 degrees active Internal rotation to L5 Rotator cuff strength is decreased Positive Hawkin's tests and empty can (only able to test at 80 degrees) Speeds and Yergason's tests normal. Obrien's difficult to assess due to generalized pain   Ultrasound: (this study is limited by body habitus) The biceps tendon is intact with a small amount of fluid around it.  There is fluid in the supraspinatus bursa but the tendon appears intact.  The subscapularis has some calcifications but appears to be intact.  Teres minor and infraspinatus tendons are intact.  There is no clear impingement seen on dynamic testing.    Consent obtained and verified. Cleansed with alcohol. Topical analgesic spray: Ethyl chloride. Joint: right subacromial Approached in typical fashion from the posterior aspect Completed without difficulty Meds: 40mg  depo medrol; 6 ml of lidocaine. Needle: 25 g Aftercare instructions and Red flags advised.      Assessment & Plan:

## 2011-06-04 NOTE — Assessment & Plan Note (Signed)
We gave her an injection today and will start on ROM exercises.  Also gave meloxicam for inflammation.  When she follows up in 3 weeks we will progress to theraband exercises if she is improving.  If she is not improving we will either proceed to formal PT or schedule an MRI with the idea of surgical consultation.

## 2011-06-09 ENCOUNTER — Other Ambulatory Visit: Payer: Self-pay | Admitting: Family Medicine

## 2011-06-09 ENCOUNTER — Encounter: Payer: Self-pay | Admitting: Family Medicine

## 2011-06-09 ENCOUNTER — Ambulatory Visit (INDEPENDENT_AMBULATORY_CARE_PROVIDER_SITE_OTHER): Payer: Self-pay | Admitting: Family Medicine

## 2011-06-09 ENCOUNTER — Other Ambulatory Visit (HOSPITAL_COMMUNITY)
Admission: RE | Admit: 2011-06-09 | Discharge: 2011-06-09 | Disposition: A | Discharge status: Home / Self Care | Payer: Self-pay | Source: Ambulatory Visit | Attending: Family Medicine | Admitting: Family Medicine

## 2011-06-09 DIAGNOSIS — N183 Chronic kidney disease, stage 3 unspecified: Secondary | ICD-10-CM

## 2011-06-09 DIAGNOSIS — I10 Essential (primary) hypertension: Secondary | ICD-10-CM

## 2011-06-09 DIAGNOSIS — Z124 Encounter for screening for malignant neoplasm of cervix: Secondary | ICD-10-CM

## 2011-06-09 DIAGNOSIS — N760 Acute vaginitis: Secondary | ICD-10-CM

## 2011-06-09 DIAGNOSIS — Z01419 Encounter for gynecological examination (general) (routine) without abnormal findings: Secondary | ICD-10-CM | POA: Insufficient documentation

## 2011-06-09 DIAGNOSIS — Z1231 Encounter for screening mammogram for malignant neoplasm of breast: Secondary | ICD-10-CM

## 2011-06-09 LAB — POCT WET PREP (WET MOUNT)
Trichomonas Wet Prep HPF POC: NEGATIVE
Yeast Wet Prep HPF POC: NEGATIVE

## 2011-06-09 LAB — BASIC METABOLIC PANEL
BUN: 15 mg/dL (ref 6–23)
CO2: 30 mEq/L (ref 19–32)
Calcium: 9.1 mg/dL (ref 8.4–10.5)
Chloride: 104 mEq/L (ref 96–112)
Creat: 1.32 mg/dL — ABNORMAL HIGH (ref 0.50–1.10)
Glucose, Bld: 88 mg/dL (ref 70–99)
Potassium: 4.1 mEq/L (ref 3.5–5.3)
Sodium: 139 mEq/L (ref 135–145)

## 2011-06-09 MED ORDER — HYDROCHLOROTHIAZIDE 25 MG PO TABS: 25.0000 mg | ORAL_TABLET | Freq: Every day | ORAL | Status: DC

## 2011-06-09 NOTE — Assessment & Plan Note (Signed)
Not well controlled.  Restart HCTZ follow her readings at home

## 2011-06-09 NOTE — Assessment & Plan Note (Signed)
Recheck her BMET.   Is on nsaid currently.

## 2011-06-09 NOTE — Patient Instructions (Signed)
You should restart your HCTZ medication and take your blood pressure.  It should be less 140/90  Your ideal weight is less than 200.  You should work to lose weight by exercising and watching your diet  I will call you if your tests are not good.  Otherwise I will send you a letter.  If you do not hear from me with in 2 weeks please call our office.

## 2011-06-09 NOTE — Progress Notes (Signed)
  Subjective:    Patient ID: Heidi Rivera, female    DOB: 13-Mar-1966, 46 y.o.   MRN: [id number]  HPI  Here for CPE  HYPERTENSION Disease Monitoring Home BP Monitoring not doing Chest pain- no     Dyspnea-  no  Medications Compliance: stopped taking HCTZ a few months ago was too expensive. Lightheadedness-  no  Edema-  no   ROS - See HPI  PMH Lab Review   Potassium  Date Value Range Status  07/15/2010 3.8  3.5-5.1 (mEq/L) Final     Sodium  Date Value Range Status  07/15/2010 139  135-145 (mEq/L) Final     Kidney Disease No symptoms of edema or unusual fatigue.  Taking Mobic for shoulder.  Has not had blood test since last visit         Review of Systems Patient reports no  vision/ hearing changes,anorexia, weight change, fever ,adenopathy, persistant / recurrent hoarseness, swallowing issues, chest pain, edema,persistant / recurrent cough, hemoptysis, dyspnea(rest, exertional, paroxysmal nocturnal), gastrointestinal  bleeding (melena, rectal bleeding), abdominal pain, excessive heart burn, GU symptoms(dysuria, hematuria, pyuria, voiding/incontinence  Issues) syncope, focal weakness, severe memory loss, concerning skin lesions, depression, anxiety, abnormal bruising/bleeding, major joint swelling, breast masses or abnormal vaginal bleeding.       Objective:   Physical Exam  Heart - Regular rate and rhythm.  No murmurs, gallops or rubs.    Lungs:  Normal respiratory effort, chest expands symmetrically. Lungs are clear to auscultation, no crackles or wheezes. Abdomen: soft and non-tender without masses, organomegaly or hernias noted.  No guarding or rebound Is Obese Skin:  Intact without suspicious lesions or rashes Neck:  No deformities, thyromegaly, masses, or tenderness noted.   Supple with full range of motion without pain. Genitalia:  Normal introitus for age, no external lesions, no vaginal discharge, mucosa pink and moist, no vaginal or cervical lesions, no  vaginal atrophy, no friaility or hemorrhage, normal uterus size and position, no adnexal masses or tenderness exam limited by habitus        Assessment & Plan:    Prevention - discussed weight control as her major goal

## 2011-06-10 LAB — GC/CHLAMYDIA PROBE AMP, GENITAL
Chlamydia, DNA Probe: NEGATIVE
GC Probe Amp, Genital: NEGATIVE

## 2011-06-13 ENCOUNTER — Encounter: Payer: Self-pay | Admitting: Family Medicine

## 2011-06-23 ENCOUNTER — Ambulatory Visit: Payer: Self-pay

## 2011-06-25 ENCOUNTER — Ambulatory Visit: Payer: Self-pay | Admitting: Family Medicine

## 2011-07-08 ENCOUNTER — Ambulatory Visit: Payer: Self-pay

## 2011-09-04 ENCOUNTER — Telehealth: Payer: Self-pay | Admitting: Family Medicine

## 2011-09-04 ENCOUNTER — Ambulatory Visit (INDEPENDENT_AMBULATORY_CARE_PROVIDER_SITE_OTHER): Payer: Self-pay | Admitting: Family Medicine

## 2011-09-04 ENCOUNTER — Encounter: Payer: Self-pay | Admitting: Family Medicine

## 2011-09-04 VITALS — BP 149/81 | HR 54 | Ht 69.5 in | Wt 282.0 lb

## 2011-09-04 DIAGNOSIS — I10 Essential (primary) hypertension: Secondary | ICD-10-CM

## 2011-09-04 DIAGNOSIS — G43109 Migraine with aura, not intractable, without status migrainosus: Secondary | ICD-10-CM

## 2011-09-04 MED ORDER — SUMATRIPTAN SUCCINATE 50 MG PO TABS: 50.0000 mg | ORAL_TABLET | Freq: Once | ORAL | Status: DC | PRN

## 2011-09-04 MED ORDER — KETOROLAC TROMETHAMINE 30 MG/ML IJ SOLN
30.0000 mg | Freq: Once | INTRAMUSCULAR | Status: AC
  Administered 2011-09-04: 30 mg via INTRAMUSCULAR

## 2011-09-04 MED ORDER — PROMETHAZINE HCL 25 MG/ML IJ SOLN
25.0000 mg | Freq: Once | INTRAMUSCULAR | Status: AC
  Administered 2011-09-04: 25 mg via INTRAMUSCULAR

## 2011-09-04 NOTE — Telephone Encounter (Signed)
Ms. Pralle called back to ask about appt today.  Will be listening out for phone

## 2011-09-04 NOTE — Assessment & Plan Note (Signed)
Feel headache is more likely intractable migraine, but difficult to sort out in setting of HTN.  Patient given IM Toradol and Phenergan to break headache.  Also Rx for Imitrex as rescue medication.

## 2011-09-04 NOTE — Telephone Encounter (Signed)
Message left to return call.

## 2011-09-04 NOTE — Progress Notes (Signed)
  Subjective:    Patient ID: Heidi Rivera, female    DOB: 08/04/1965, 46 y.o.   MRN: [id number]  HPI  Heidi Rivera comes in due to headaches and elevated blood pressure.  She says that she has had the headache for about 4 days and it has not gone away.  She has a history of migraine with aura, and says that she is seeing dots, is sensitive to light and sound, and feels nauseated, all of which are typical for her migraines.  She does not like taking medications, but has tried several over the counter medications (excedrin, ibuprofen) without relief.    She says her blood pressure has been running high during this headache, ranges from 140's/80's- 200/110.  The extreme high reading was only once.  She has never compared her bp cuff to one at the office.  She says she feels like her migraine headaches have been worse since starting the HCTZ again in January.  She says she is taking it every day.  Denies chest pain, dyspnea, LE swelling.   Review of Systems Pertinent items in HPI.     Objective:   Physical Exam  Vitals reviewed. Constitutional: She is oriented to person, place, and time. She appears well-developed and well-nourished. No distress.  HENT:  Head: Normocephalic and atraumatic.  Mouth/Throat: Oropharynx is clear and moist.  Eyes: EOM are normal. Pupils are equal, round, and reactive to light.       Fundoscopic exam WNL  Neck: Normal range of motion. No JVD present.  Cardiovascular: Normal rate, regular rhythm and normal heart sounds.   Pulmonary/Chest: Breath sounds normal.  Musculoskeletal: Normal range of motion. She exhibits no edema.  Neurological: She is oriented to person, place, and time. She has normal reflexes. No cranial nerve deficit.  Skin: Skin is warm. No rash noted.          Assessment & Plan:

## 2011-09-04 NOTE — Patient Instructions (Signed)
I'm sorry you are not feeling well.  I do not think the HCTZ is causing your headaches.  Your headaches sound like migraine headaches, and I expect the shots you got today will help you feel better. I have also sent a prescription to your pharmacy for a rescue migraine medication. Please bring in your blood pressure cuff to your upcoming visit with Dr. Erin Hearing.

## 2011-09-04 NOTE — Telephone Encounter (Signed)
Spoke with patient and she states BP has been running high 200 /140 she states. Has been having headaches for a while. Now for a few days has been seeing spots before her eyes. Advised to come to office now and will work her in.

## 2011-09-04 NOTE — Telephone Encounter (Signed)
Pt has been experiencing headaches and white spots in front of her eyes for the last few days.  Not sure if it's her BP Wants to come in today

## 2011-09-04 NOTE — Progress Notes (Signed)
Addended by: Christen Bame D on: 09/04/2011 05:01 PM   Modules accepted: Orders

## 2011-09-04 NOTE — Assessment & Plan Note (Signed)
Poorly controled- and difficult to assess if they are contributing to headaches.  Neuro exam and fundoscopic exam WNL.  Will treat migraine and have close follow up for HTN.  She is instructed to keep taking HCTZ, and to bring in her BP cuff.

## 2011-09-10 ENCOUNTER — Emergency Department (INDEPENDENT_AMBULATORY_CARE_PROVIDER_SITE_OTHER)
Admission: EM | Admit: 2011-09-10 | Discharge: 2011-09-10 | Disposition: A | Discharge status: Home / Self Care | Payer: Self-pay | Source: Home / Self Care | Attending: Family Medicine | Admitting: Family Medicine

## 2011-09-10 ENCOUNTER — Encounter (HOSPITAL_COMMUNITY): Payer: Self-pay

## 2011-09-10 DIAGNOSIS — M13159 Monoarthritis, not elsewhere classified, unspecified hip: Secondary | ICD-10-CM

## 2011-09-10 DIAGNOSIS — M533 Sacrococcygeal disorders, not elsewhere classified: Secondary | ICD-10-CM

## 2011-09-10 MED ORDER — HYDROCODONE-ACETAMINOPHEN 5-500 MG PO TABS: 1.0000 | ORAL_TABLET | Freq: Four times a day (QID) | ORAL | Status: AC | PRN

## 2011-09-10 MED ORDER — IBUPROFEN 600 MG PO TABS: 600.0000 mg | ORAL_TABLET | Freq: Three times a day (TID) | ORAL | Status: AC

## 2011-09-10 NOTE — ED Notes (Signed)
Has been sitting in chair more than usual , due to a court related issue, pain perirectal area; does not feel like rectal pain

## 2011-09-10 NOTE — Discharge Instructions (Signed)
Take the prescribed medication as instructed. Monitor your blood pressure while taking the anti-inflammatory medication and stopped taking ibuprofen if your blood pressure is elevated. Do not take for longer than 5 days in a row. Use a donut cushion if you need to seat for prolonged periods. Use ice as much as possible as we discussed can also follow handout instructions. Can use over-the-counter MiraLax one cap full mix with 8 ounces of Gatorade he is tenderness during bowel movements. Return if persistent pain after 1 week or return earlier if worsening symptoms despite following treatment.

## 2011-09-13 NOTE — ED Provider Notes (Signed)
History     CSN: [id number]  Arrival date & time 09/10/11  [id number]   First MD Initiated Contact with Patient 09/10/11 1826      Chief Complaint  Patient presents with  . Tailbone Pain    (Consider location/radiation/quality/duration/timing/severity/associated sxs/prior treatment) HPI Comments: 46 y/o obese female here c/o pain in tail bone for 2 days. No radiation. 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 No recent falls. Denies pain with bowel movements, last bowel movement yesterday normal as usual. No fever or chills. No bumps or masses. No drainage. No taking any medication for her symptoms.   Past Medical History  Diagnosis Date  . Hypertension   . Vertigo   . Migraines   . Obesity     Past Surgical History  Procedure Date  . Hemorrhoid surgery     Family History  Problem Relation Age of Onset  . Diabetes Mother   . Diabetes Maternal Aunt   . Hypertension Father     History  Substance Use Topics  . Smoking status: Never Smoker   . Smokeless tobacco: Never Used  . Alcohol Use: Yes    OB History    Grav Para Term Preterm Abortions TAB SAB Ect Mult Living                  Review of Systems  All other systems reviewed and are negative.    Allergies  Review of patient's allergies indicates no known allergies.  Home Medications   Current Outpatient Rx  Name Route Sig Dispense Refill  . ACETAMINOPHEN 500 MG PO TABS Oral Take 1,000 mg by mouth every 6 (six) hours as needed. For pain     . HYDROCHLOROTHIAZIDE 25 MG PO TABS Oral Take 1 tablet (25 mg total) by mouth daily. 30 tablet 3  . HYDROCODONE-ACETAMINOPHEN 5-500 MG PO TABS Oral Take 1 tablet by mouth every 6 (six) hours as needed for pain. 10 tablet 0  . IBUPROFEN 600 MG PO TABS Oral Take 1 tablet (600 mg total) by mouth 3 (three) times daily. 20 tablet 0  . MELOXICAM 15 MG PO TABS Oral Take 1 tablet  (15 mg total) by mouth daily. 30 tablet 2  . SUMATRIPTAN SUCCINATE 50 MG PO TABS Oral Take 1 tablet (50 mg total) by mouth once as needed for migraine. May repeat in two hours (once) if not better. 10 tablet 0    BP 160/87  Pulse 59  Temp(Src) 97.5 F (36.4 C) (Oral)  Resp 18  SpO2 100%  Physical Exam  Nursing note and vitals reviewed. Constitutional: She is oriented to person, place, and time. She appears well-developed and well-nourished. No distress.  HENT:  Head: Normocephalic and atraumatic.  Cardiovascular: Normal heart sounds.   Pulmonary/Chest: Breath sounds normal.  Abdominal: Soft. There is no tenderness.  Musculoskeletal:       Tenderness to palpation at coccyx bone. No crepitus. No skin changes like redness or ulcers. No fluctuations . No abscess. Bilateral fair hip ROM with no reported pain.   Neurological: She is alert and oriented to person, place, and time.  Skin: No rash noted.    ED Course  Procedures (including critical care time)  Labs Reviewed - No data to display No results found.   1. Coccydynia       MDM  Pain with palpation over coccyx  bone. No findings suggestive of pilonidal cyst or abscess. Asked to avoid prolonged sitting and use a donut seat. Use ICE. Prescribed Vicodin and ibuprofen.        Randa Spike, MD 09/13/11 [id number]

## 2011-09-17 ENCOUNTER — Ambulatory Visit: Payer: Self-pay | Admitting: Family Medicine

## 2011-10-25 ENCOUNTER — Encounter (HOSPITAL_COMMUNITY): Payer: Self-pay | Admitting: *Deleted

## 2011-10-25 ENCOUNTER — Emergency Department (INDEPENDENT_AMBULATORY_CARE_PROVIDER_SITE_OTHER): Payer: Self-pay

## 2011-10-25 ENCOUNTER — Emergency Department (INDEPENDENT_AMBULATORY_CARE_PROVIDER_SITE_OTHER)
Admission: EM | Admit: 2011-10-25 | Discharge: 2011-10-25 | Disposition: A | Discharge status: Home / Self Care | Payer: Self-pay | Source: Home / Self Care | Attending: Emergency Medicine | Admitting: Emergency Medicine

## 2011-10-25 DIAGNOSIS — S93409A Sprain of unspecified ligament of unspecified ankle, initial encounter: Secondary | ICD-10-CM

## 2011-10-25 MED ORDER — MELOXICAM 15 MG PO TABS: 15.0000 mg | ORAL_TABLET | Freq: Every day | ORAL | Status: DC

## 2011-10-25 MED ORDER — TRAMADOL HCL 50 MG PO TABS: 100.0000 mg | ORAL_TABLET | Freq: Three times a day (TID) | ORAL | Status: AC | PRN

## 2011-10-25 NOTE — ED Provider Notes (Signed)
Chief Complaint  Patient presents with  . Ankle Pain  . Ankle Injury    History of Present Illness:   Heidi Rivera is a 46 year old female who twisted her left ankle yesterday. She felt a pop. Ever since then she's had pain over the medial and lateral malleoli. It hurts her to walk. There is no numbness or tingling.  Review of Systems:  Other than noted above, the patient denies any of the following symptoms: Systemic:  No fevers, chills, sweats, or aches.  No fatigue or tiredness. Musculoskeletal:  No joint pain, arthritis, bursitis, swelling, back pain, or neck pain. Neurological:  No muscular weakness, paresthesias, headache, or trouble with speech or coordination.  No dizziness.   [place]:  Past medical history, family history, social history, meds, and allergies were reviewed.  Physical Exam:   Vital signs:  BP 153/98  Pulse 57  Temp(Src) 97.1 F (36.2 C) (Oral)  Resp 16  SpO2 100%  LMP 10/16/2011 Gen:  Alert and oriented times 3.  In no distress. Musculoskeletal: There was swelling and pain to palpation over both medial and lateral malleoli and below the medial and lateral malleolus and. The ankle itself had a full range of motion with slight pain. Anterior drawer test was negative and talar tilt test was negative. Otherwise, all joints had a full a ROM with no swelling, bruising or deformity.  No edema, pulses full. Extremities were warm and pink.  Capillary refill was brisk.  Skin:  Clear, warm and dry.  No rash. Neuro:  Alert and oriented times 3.  Muscle strength was normal.  Sensation was intact to light touch.   Radiology:  Dg Ankle Complete Left  10/25/2011  *RADIOLOGY REPORT*  Clinical Data: Injury, pain, swelling.  LEFT ANKLE COMPLETE - 3+ VIEW  Comparison: None.  Findings: Diffuse soft tissue swelling about the ankle.  Small bone fragment noted along the medial malleolus which appears well corticated and likely related to old injury.  There does appear to be a joint effusion.  No  visible acute fracture, subluxation or dislocation.  IMPRESSION: Joint effusion and diffuse soft tissue swelling.  No visible acute bony findings.  Original Report Authenticated By: Raelyn Number, M.D.   Course in Urgent Care Center:   The patient was placed in an ASO brace.   Assessment:  The encounter diagnosis was Ankle sprain.  Plan:   1.  The following meds were prescribed:   New Prescriptions   MELOXICAM (MOBIC) 15 MG TABLET    Take 1 tablet (15 mg total) by mouth daily.   TRAMADOL (ULTRAM) 50 MG TABLET    Take 2 tablets (100 mg total) by mouth every 8 (eight) hours as needed for pain.   2.  The patient was instructed in symptomatic care, including rest and activity, elevation, application of ice and compression.  Appropriate handouts were given. 3.  The patient was told to return if becoming worse in any way, if no better in 3 or 4 days, and given some red flag symptoms that would indicate earlier return.   4.  The patient was told to follow up here in 2 weeks if no improvement.   Harden Mo, MD 10/25/11 2151

## 2011-10-25 NOTE — Discharge Instructions (Signed)
Ankle Sprain An ankle sprain is an injury to the strong, fibrous tissues (ligaments) that hold the bones of your ankle joint together.  CAUSES Ankle sprain usually is caused by a fall or by twisting your ankle. People who participate in sports are more prone to these types of injuries.  SYMPTOMS  Symptoms of ankle sprain include:  Pain in your ankle. The pain may be present at rest or only when you are trying to stand or walk.   Swelling.   Bruising. Bruising may develop immediately or within 1 to 2 days after your injury.   Difficulty standing or walking.  DIAGNOSIS  Your caregiver will ask you details about your injury and perform a physical exam of your ankle to determine if you have an ankle sprain. During the physical exam, your caregiver will press and squeeze specific areas of your foot and ankle. Your caregiver will try to move your ankle in certain ways. An X-ray exam may be done to be sure a bone was not broken or a ligament did not separate from one of the bones in your ankle (avulsion).  TREATMENT  Certain types of braces can help stabilize your ankle. Your caregiver can make a recommendation for this. Your caregiver may recommend the use of medication for pain. If your sprain is severe, your caregiver may refer you to a surgeon who helps to restore function to parts of your skeletal system (orthopedist) or a physical therapist. HOME CARE INSTRUCTIONS  Apply ice to your injury for 1 to 2 days or as directed by your caregiver. Applying ice helps to reduce inflammation and pain.  Put ice in a plastic bag.   Place a towel between your skin and the bag.   Leave the ice on for 15 to 20 minutes at a time, every 2 hours while you are awake.   Take over-the-counter or prescription medicines for pain, discomfort, or fever only as directed by your caregiver.   Keep your injured leg elevated, when possible, to lessen swelling.   If your caregiver recommends crutches, use them as  instructed. Gradually, put weight on the affected ankle. Continue to use crutches or a cane until you can walk without feeling pain in your ankle.   If you have a plaster splint, wear the splint as directed by your caregiver. Do not rest it on anything harder than a pillow the first 24 hours. Do not put weight on it. Do not get it wet. You may take it off to take a shower or bath.   You may have been given an elastic bandage to wear around your ankle to provide support. If the elastic bandage is too tight (you have numbness or tingling in your foot or your foot becomes cold and blue), adjust the bandage to make it comfortable.   If you have an air splint, you may blow more air into it or let air out to make it more comfortable. You may take your splint off at night and before taking a shower or bath.   Wiggle your toes in the splint several times per day if you are able.  SEEK MEDICAL CARE IF:   You have an increase in bruising, swelling, or pain.   Your toes feel cold.   Pain relief is not achieved with medication.  SEEK IMMEDIATE MEDICAL CARE IF: Your toes are numb or blue or you have severe pain. MAKE SURE YOU:   Understand these instructions.   Will watch your condition.     Will get help right away if you are not doing well or get worse.  Document Released: 05/12/2005 Document Revised: 05/01/2011 Document Reviewed: 12/15/2007 [employer] Patient Information 2012 [employer]. 

## 2011-10-25 NOTE — ED Notes (Signed)
Per pt going down steps - came down on left foot heard and felt a pop in left ankle - pain left  ankle with swelling - increased pain with minimal weight

## 2011-10-27 ENCOUNTER — Ambulatory Visit: Payer: Self-pay | Admitting: Family Medicine

## 2012-03-22 ENCOUNTER — Emergency Department: Payer: Self-pay | Admitting: Emergency Medicine

## 2012-03-22 LAB — BASIC METABOLIC PANEL
Anion Gap: 8 (ref 7–16)
BUN: 12 mg/dL (ref 7–18)
Calcium, Total: 8.9 mg/dL (ref 8.5–10.1)
Chloride: 105 mmol/L (ref 98–107)
Co2: 27 mmol/L (ref 21–32)
Creatinine: 1.47 mg/dL — ABNORMAL HIGH (ref 0.60–1.30)
EGFR (African American): 49 — ABNORMAL LOW
EGFR (Non-African Amer.): 42 — ABNORMAL LOW
Glucose: 84 mg/dL (ref 65–99)
Osmolality: 278 (ref 275–301)
Potassium: 3.6 mmol/L (ref 3.5–5.1)
Sodium: 140 mmol/L (ref 136–145)

## 2012-03-22 LAB — TROPONIN I
Troponin-I: <0.02 ng/mL
Troponin-I: <0.02 ng/mL

## 2012-03-22 LAB — CBC
HCT: 41.8 % (ref 35.0–47.0)
HGB: 14 g/dL (ref 12.0–16.0)
MCH: 28.6 pg (ref 26.0–34.0)
MCHC: 33.5 g/dL (ref 32.0–36.0)
MCV: 86 fL (ref 80–100)
Platelet: 203 10*3/uL (ref 150–440)
RBC: 4.88 10*6/uL (ref 3.80–5.20)
RDW: 13.3 % (ref 11.5–14.5)
WBC: 6.4 10*3/uL (ref 3.6–11.0)

## 2012-03-22 LAB — CK TOTAL AND CKMB (NOT AT ARMC)
CK, Total: 228 U/L — ABNORMAL HIGH (ref 21–215)
CK, Total: 194 U/L (ref 21–215)
CK-MB: 0.9 ng/mL (ref 0.5–3.6)
CK-MB: 0.6 ng/mL (ref 0.5–3.6)

## 2012-10-21 ENCOUNTER — Emergency Department (HOSPITAL_COMMUNITY)
Admission: EM | Admit: 2012-10-21 | Discharge: 2012-10-21 | Disposition: A | Discharge status: Home / Self Care | Payer: Self-pay | Attending: Emergency Medicine | Admitting: Emergency Medicine

## 2012-10-21 ENCOUNTER — Emergency Department (HOSPITAL_COMMUNITY): Payer: Self-pay

## 2012-10-21 DIAGNOSIS — I10 Essential (primary) hypertension: Secondary | ICD-10-CM | POA: Insufficient documentation

## 2012-10-21 DIAGNOSIS — E669 Obesity, unspecified: Secondary | ICD-10-CM | POA: Insufficient documentation

## 2012-10-21 DIAGNOSIS — I498 Other specified cardiac arrhythmias: Secondary | ICD-10-CM | POA: Insufficient documentation

## 2012-10-21 DIAGNOSIS — Z8679 Personal history of other diseases of the circulatory system: Secondary | ICD-10-CM | POA: Insufficient documentation

## 2012-10-21 DIAGNOSIS — R071 Chest pain on breathing: Secondary | ICD-10-CM | POA: Insufficient documentation

## 2012-10-21 DIAGNOSIS — Z79899 Other long term (current) drug therapy: Secondary | ICD-10-CM | POA: Insufficient documentation

## 2012-10-21 DIAGNOSIS — R0789 Other chest pain: Secondary | ICD-10-CM

## 2012-10-21 DIAGNOSIS — R001 Bradycardia, unspecified: Secondary | ICD-10-CM

## 2012-10-21 MED ORDER — HYDROCODONE-ACETAMINOPHEN 5-325 MG PO TABS: 2.0000 | ORAL_TABLET | ORAL | Status: DC | PRN

## 2012-10-21 MED ORDER — NAPROXEN 500 MG PO TABS: 500.0000 mg | ORAL_TABLET | Freq: Two times a day (BID) | ORAL | Status: DC

## 2012-10-21 MED ORDER — KETOROLAC TROMETHAMINE 60 MG/2ML IM SOLN
60.0000 mg | Freq: Once | INTRAMUSCULAR | Status: AC
  Administered 2012-10-21: 60 mg via INTRAMUSCULAR

## 2012-10-21 NOTE — ED Notes (Signed)
Pt reports "I thought I had muscle pain and I had been taking Motrin, but now certain movements hurt under my breast on the right side."

## 2012-10-21 NOTE — ED Provider Notes (Signed)
History     CSN: [id number]  Arrival date & time 10/21/12  0913   First MD Initiated Contact with Patient 10/21/12 [phone]      Chief Complaint  Patient presents with  . 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 She has increased pain with moving her right arm, bending over, deep breathing and palpation of her right chest just inferior and lateral to her right breast. She denies exertional shortness of breath, swelling of her legs, fevers chills nausea vomiting or coughing.  The history is provided by the patient.    Past Medical History  Diagnosis Date  . Hypertension   . Vertigo   . Migraines   . Obesity     Past Surgical History  Procedure Laterality Date  . Hemorrhoid surgery      Family History  Problem Relation Age of Onset  . Diabetes Mother   . Diabetes Maternal Aunt   . Hypertension Father     History  Substance Use Topics  . Smoking status: Never Smoker   . Smokeless tobacco: Never Used  . Alcohol Use: Yes    OB History   Grav Para Term Preterm Abortions TAB SAB Ect Mult Living                  Review of Systems  All other systems reviewed and are negative.    Allergies  Review of patient's allergies indicates no known allergies.  Home Medications   Current Outpatient Rx  Name  Route  Sig  Dispense  Refill  . hydrochlorothiazide (HYDRODIURIL) 25 MG tablet   Oral   Take 1 tablet (25 mg total) by mouth daily.   30 tablet   3   . ibuprofen (ADVIL,MOTRIN) 200 MG tablet   Oral   Take 800 mg by mouth every 6 (six) hours as needed.          [name] HYDROcodone-acetaminophen (NORCO/VICODIN) 5-325 MG per tablet   Oral   Take 2 tablets by mouth every 4 (four) hours as needed for pain.   10 tablet   0   . naproxen (NAPROSYN) 500 MG tablet   Oral   Take 1 tablet (500 mg total) by mouth 2 (two) times daily with a meal.   30 tablet   0     BP 141/95  Pulse 40  Temp(Src) 98.1 F (36.7 C) (Oral)  Resp 13  SpO2 100%  LMP 09/25/2012  Physical Exam  Nursing note and vitals reviewed. Constitutional: She appears well-developed and well-nourished. No distress.  HENT:  Head: Normocephalic and atraumatic.  Mouth/Throat: Oropharynx is clear and moist. No oropharyngeal exudate.  Eyes: Conjunctivae and EOM are normal. Pupils are equal, round, and reactive to light. Right eye exhibits no discharge. Left eye exhibits no discharge. No scleral icterus.  Neck: Normal range of motion. Neck supple. No JVD present. No thyromegaly present.  Cardiovascular: Normal rate, regular rhythm, normal heart sounds and intact distal pulses.  Exam reveals no gallop and no friction rub.   No murmur heard. Pulmonary/Chest: Effort normal and breath sounds normal. No respiratory distress. She has no wheezes. She has no  rales. She exhibits tenderness ( Tender to palpation over the right chest wall, no crepitance or subcutaneous emphysema or rash).  Abdominal: Soft. Bowel sounds are normal. She exhibits no distension and no mass. There is no tenderness.  Musculoskeletal: Normal range of motion. She exhibits no edema and no tenderness.  Lymphadenopathy:    She has no cervical adenopathy.  Neurological: She is alert. Coordination normal.  Skin: Skin is warm and dry. No rash noted. No erythema.  Psychiatric: She has a normal mood and affect. Her behavior is normal.    ED Course  Procedures (including critical care time)  Labs Reviewed - No data to display Dg Ribs Unilateral W/chest Right  10/21/2012   *RADIOLOGY REPORT*  Clinical Data: Injured at work,  now with right lateral and anterior chest and rib pain  RIGHT RIBS AND CHEST - 3+ VIEW  Comparison: 07/15/2010; 01/12/2010; 06/29/2008  Findings:  Grossly unchanged borderline enlarged cardiac silhouette and mediastinal contours.  No focal parenchymal opacity.  No pleural effusion or pneumothorax.  No evidence of edema.  No acute osseous abnormalities.  Specifically, no definite displaced right-sided rib fractures.  Multilevel thoracic spine degenerative change.  IMPRESSION: No acute cardiopulmonary disease, specifically, no definite displaced right-sided rib fractures.   Original Report Authenticated By: Jake Seats, MD     1. Chest wall pain   2. Bradycardia       MDM  Lungs and heart are clear, no swelling of the lower extremities bilaterally, normal mental status, otherwise the patient is well-appearing. I suspect that her chest pain is related to chest wall pain after her struggle with physical restraints approximately one week ago. The patient will need to have a chest x-ray and an EKG but otherwise this can be treated as chest wall pain with medications. Parenteral Toradol has been ordered, the patient otherwise appears stable.  She states that she is leaving her job for a multitude of reasons many of which predated the incident one week ago that she described above and does not desire a work note.   ED ECG REPORT  I personally interpreted this EKG   Date: 10/21/2012   Rate: 47  Rhythm: sinus bradycardia  QRS Axis: normal  Intervals: normal  ST/T Wave abnormalities: normal  Conduction Disutrbances:none  Narrative Interpretation:   Old EKG Reviewed: Compared with 08/30/2007, rate is similar, no other changes    Pt is well appaering, xray is normal - pt stable for d/c.  Likely chest wall pain.  Meds given in ED:  Medications  ketorolac (TORADOL) injection 60 mg (60 mg Intramuscular Given 10/21/12 1052)    New Prescriptions   HYDROCODONE-ACETAMINOPHEN (NORCO/VICODIN) 5-325 MG  PER TABLET    Take 2 tablets by mouth every 4 (four) hours as needed for pain.   NAPROXEN (NAPROSYN) 500 MG TABLET    Take 1 tablet (500 mg total) by mouth 2 (two) times daily with a meal.      Johnna Acosta, MD 10/21/12 1059

## 2012-10-21 NOTE — ED Notes (Signed)
Pt alert and mentating appropriately upon d/c. Pt verbalizes understanding of d/c teaching and has no further questions upon d/c teaching. Pt instructed not to drive and endorses that she is not going to drive home. Family driving pt home. Pt requested wheelchair to leave emergency department. Liza, EMT taking pt out via wheelchair. NAD noted upon d/c. Pt leaving with d/c teaching and prescriptions.

## 2013-03-24 ENCOUNTER — Telehealth: Payer: Self-pay | Admitting: Family Medicine

## 2013-03-24 DIAGNOSIS — I10 Essential (primary) hypertension: Secondary | ICD-10-CM

## 2013-03-24 MED ORDER — HYDROCHLOROTHIAZIDE 25 MG PO TABS: 25.0000 mg | ORAL_TABLET | Freq: Every day | ORAL | Status: DC

## 2013-03-24 NOTE — Telephone Encounter (Signed)
Both numbers in chart have been disconnected. Pt will need to schedule appt for after her meeting with Pamala Hurry. She has not been seen since April, therefore, will give a one month supply only.Heidi Rivera, [name]

## 2013-03-24 NOTE — Telephone Encounter (Signed)
Pt needs a refill on her BP medication hyrochlorothiazide sent to [facility]. jw

## 2013-03-26 ENCOUNTER — Encounter (HOSPITAL_COMMUNITY): Payer: Self-pay | Admitting: Emergency Medicine

## 2013-03-26 ENCOUNTER — Emergency Department (HOSPITAL_COMMUNITY)
Admission: EM | Admit: 2013-03-26 | Discharge: 2013-03-26 | Disposition: A | Discharge status: Home / Self Care | Payer: Self-pay | Source: Home / Self Care | Attending: Emergency Medicine | Admitting: Emergency Medicine

## 2013-03-26 DIAGNOSIS — M5431 Sciatica, right side: Secondary | ICD-10-CM

## 2013-03-26 DIAGNOSIS — M5430 Sciatica, unspecified side: Secondary | ICD-10-CM

## 2013-03-26 MED ORDER — HYDROCODONE-ACETAMINOPHEN 5-325 MG PO TABS
ORAL_TABLET | ORAL | Status: AC
  Filled 2013-03-26: qty 2

## 2013-03-26 MED ORDER — METHOCARBAMOL 500 MG PO TABS: 500.0000 mg | ORAL_TABLET | Freq: Three times a day (TID) | ORAL | Status: DC

## 2013-03-26 MED ORDER — PREDNISONE 20 MG PO TABS: ORAL_TABLET | ORAL | Status: DC

## 2013-03-26 MED ORDER — HYDROCODONE-ACETAMINOPHEN 5-325 MG PO TABS
2.0000 | ORAL_TABLET | Freq: Once | ORAL | Status: AC
  Administered 2013-03-26: 2 via ORAL

## 2013-03-26 MED ORDER — HYDROCODONE-ACETAMINOPHEN 5-325 MG PO TABS: ORAL_TABLET | ORAL | Status: DC

## 2013-03-26 MED ORDER — CYCLOBENZAPRINE HCL 5 MG PO TABS: 5.0000 mg | ORAL_TABLET | Freq: Three times a day (TID) | ORAL | Status: DC | PRN

## 2013-03-26 NOTE — ED Notes (Signed)
Pt  Reports  Pain in  Back  And  r  Leg    X  8  Days     Ambulated  To  Exam  Room        Pt  Sitting upright  On  Exam table        -  denys  Any  Recent  Injury   Denys  Any  Urinary  Symptoms

## 2013-03-26 NOTE — ED Provider Notes (Signed)
Chief Complaint:   Chief Complaint  Patient presents with  . Back Pain    History of Present Illness:   Heidi Rivera is a 47 year old female who has had a two-day history of lower back pain radiating down her right leg as far as her foot with numbness, tingling, weakness in the leg. She denies any urinary retention, urinary incontinence, or bowel incontinence. She has no saddle anesthesia, no abdominal pain or history of headache, stiff neck, fever, chills, unintentional weight loss. She denies any injury to her back. This just came on its own. She has not had any prior history of back problems.  Review of Systems:  Other than noted above, the patient denies any of the following symptoms: Systemic:  No fever, chills, severe fatigue, or unexplained weight loss. GI:  No abdominal pain, nausea, vomiting, diarrhea, constipation, incontinence of bowel, or blood in stool. GU:  No dysuria, frequency, urgency, or hematuria. No incontinence of urine or difficulty urinating.  M-S:  No neck pain, joint pain, arthritis, or myalgias. Neuro:  No paresthesias, saddle anesthesia, muscular weakness, or progressive neurological deficit.  [place]:  Past medical history, family history, social history, meds, and allergies were reviewed. Specifically, there is no history of cancer, major trauma, osteoporosis, immunosuppression, or HIV infection. She takes hydrochlorothiazide and has high blood pressure and bradycardia.  Physical Exam:   Vital signs:  BP 150/86  Pulse 62  Temp(Src) 98.5 F (36.9 C) (Oral)  Resp 18  SpO2 100% General:  Alert, oriented, in no distress. Abdomen:  Soft, non-tender.  No organomegaly or mass.  No pulsatile midline abdominal mass or bruit. Back:  There is tenderness to her back in the right lower lumbar spine and in the right buttock. The back has a limited range of motion with 30 of flexion, 10 of extension, 20 of lateral bending, and 45 of rotation with pain. Straight leg  raising is positive on the right with pain radiating down the leg and a positive Lesegue and popliteal compression sign. Straight leg raising is negative on the left. Neuro:  Normal muscle strength, sensations with knee reflexes been 0 bilaterally and ankle reflexes being 2+ on the left and 1+ on the right. Extremities: Pedal pulses were full, there was no edema. Skin:  Clear, warm and dry.  No rash.  Course in Urgent Care Center:   Given Norco 5/325 2 tablets for pain.  Assessment:  The encounter diagnosis was Sciatica, right.  Possibly due to herniated nucleus pulposis, appears to be at L5-S1 level.  Plan:   1.  Meds:  The following meds were prescribed:   Discharge Medication List as of 03/26/2013 11:57 AM    START taking these medications   Details  cyclobenzaprine (FLEXERIL) 5 MG tablet Take 1 tablet (5 mg total) by mouth 3 (three) times daily as needed for muscle spasms., Starting 03/26/2013, Until Discontinued, Normal    !! HYDROcodone-acetaminophen (NORCO/VICODIN) 5-325 MG per tablet 1 to 2 tabs every 4 to 6 hours as needed for pain., Print         predniSONE (DELTASONE) 20 MG tablet 3 daily for 4 days, 2 daily for 4 days, 1 daily for 4 days, Normal     !! - Potential duplicate medications found. Please discuss with provider.      2.  Patient Education/Counseling:  The patient was given appropriate handouts, self care instructions, and instructed in symptomatic relief. The patient was encouraged to try to be as active as possible and given  some exercises to do followed by moist heat.  3.  Follow up:  The patient was told to follow up if no better in 3 to 4 days, if becoming worse in any way, and given some red flag symptoms such as worsening pain or new neurological symptoms which would prompt immediate return.  Follow up with Dr. Augustin Coupe in 2 weeks.     Harden Mo, MD 03/26/13 [phone]

## 2013-03-26 NOTE — ED Notes (Signed)
Dr  Jake Michaelis did  Not  Want     Pt to have  Robaxin  rx  Filled      [facility]  [facility]  [facility]  D/c

## 2013-04-06 ENCOUNTER — Ambulatory Visit: Payer: Self-pay

## 2013-04-26 ENCOUNTER — Other Ambulatory Visit: Payer: Self-pay | Admitting: Family Medicine

## 2013-04-26 DIAGNOSIS — Z1231 Encounter for screening mammogram for malignant neoplasm of breast: Secondary | ICD-10-CM

## 2013-05-06 ENCOUNTER — Ambulatory Visit (HOSPITAL_COMMUNITY): Payer: Self-pay | Attending: Family Medicine

## 2013-06-03 ENCOUNTER — Ambulatory Visit (HOSPITAL_COMMUNITY): Payer: Self-pay | Attending: Family Medicine

## 2013-11-07 ENCOUNTER — Telehealth: Payer: Self-pay | Admitting: Family Medicine

## 2013-11-07 NOTE — Telephone Encounter (Signed)
Called number back for after hours emergency line. No answer. Left message to call back if needed or to call 911 if emergency.   Liam Graham, PGY-3 Family Medicine Resident

## 2014-03-11 ENCOUNTER — Emergency Department (HOSPITAL_COMMUNITY): Payer: Self-pay

## 2014-03-11 ENCOUNTER — Encounter (HOSPITAL_COMMUNITY): Payer: Self-pay | Admitting: Emergency Medicine

## 2014-03-11 ENCOUNTER — Emergency Department (HOSPITAL_COMMUNITY)
Admission: EM | Admit: 2014-03-11 | Discharge: 2014-03-12 | Disposition: A | Discharge status: Home / Self Care | Payer: Self-pay | Attending: Emergency Medicine | Admitting: Emergency Medicine

## 2014-03-11 DIAGNOSIS — R05 Cough: Secondary | ICD-10-CM | POA: Insufficient documentation

## 2014-03-11 DIAGNOSIS — R0602 Shortness of breath: Secondary | ICD-10-CM | POA: Insufficient documentation

## 2014-03-11 DIAGNOSIS — R0789 Other chest pain: Secondary | ICD-10-CM | POA: Insufficient documentation

## 2014-03-11 DIAGNOSIS — G43909 Migraine, unspecified, not intractable, without status migrainosus: Secondary | ICD-10-CM | POA: Insufficient documentation

## 2014-03-11 DIAGNOSIS — I129 Hypertensive chronic kidney disease with stage 1 through stage 4 chronic kidney disease, or unspecified chronic kidney disease: Secondary | ICD-10-CM | POA: Insufficient documentation

## 2014-03-11 DIAGNOSIS — I10 Essential (primary) hypertension: Secondary | ICD-10-CM

## 2014-03-11 DIAGNOSIS — N189 Chronic kidney disease, unspecified: Secondary | ICD-10-CM | POA: Insufficient documentation

## 2014-03-11 DIAGNOSIS — Z7952 Long term (current) use of systemic steroids: Secondary | ICD-10-CM | POA: Insufficient documentation

## 2014-03-11 DIAGNOSIS — Z79899 Other long term (current) drug therapy: Secondary | ICD-10-CM | POA: Insufficient documentation

## 2014-03-11 DIAGNOSIS — M546 Pain in thoracic spine: Secondary | ICD-10-CM | POA: Insufficient documentation

## 2014-03-11 DIAGNOSIS — E669 Obesity, unspecified: Secondary | ICD-10-CM | POA: Insufficient documentation

## 2014-03-11 DIAGNOSIS — R059 Cough, unspecified: Secondary | ICD-10-CM

## 2014-03-11 DIAGNOSIS — J069 Acute upper respiratory infection, unspecified: Secondary | ICD-10-CM | POA: Insufficient documentation

## 2014-03-11 DIAGNOSIS — Z791 Long term (current) use of non-steroidal anti-inflammatories (NSAID): Secondary | ICD-10-CM | POA: Insufficient documentation

## 2014-03-11 LAB — BASIC METABOLIC PANEL
Anion gap: 14 (ref 5–15)
BUN: 25 mg/dL — ABNORMAL HIGH (ref 6–23)
CO2: 24 mEq/L (ref 19–32)
Calcium: 9.2 mg/dL (ref 8.4–10.5)
Chloride: 105 mEq/L (ref 96–112)
Creatinine, Ser: 1.86 mg/dL — ABNORMAL HIGH (ref 0.50–1.10)
GFR calc Af Amer: 36 mL/min — ABNORMAL LOW (ref >90)
GFR calc non Af Amer: 31 mL/min — ABNORMAL LOW (ref >90)
Glucose, Bld: 100 mg/dL — ABNORMAL HIGH (ref 70–99)
Potassium: 3.8 mEq/L (ref 3.7–5.3)
Sodium: 143 mEq/L (ref 137–147)

## 2014-03-11 LAB — CBC WITH DIFFERENTIAL/PLATELET
Basophils Absolute: 0 10*3/uL (ref 0.0–0.1)
Basophils Relative: 0 % (ref 0–1)
Eosinophils Absolute: 0.1 10*3/uL (ref 0.0–0.7)
Eosinophils Relative: 1 % (ref 0–5)
HCT: 40 % (ref 36.0–46.0)
Hemoglobin: 13.4 g/dL (ref 12.0–15.0)
Lymphocytes Relative: 34 % (ref 12–46)
Lymphs Abs: 2.9 10*3/uL (ref 0.7–4.0)
MCH: 27.7 pg (ref 26.0–34.0)
MCHC: 33.5 g/dL (ref 30.0–36.0)
MCV: 82.6 fL (ref 78.0–100.0)
Monocytes Absolute: 0.6 10*3/uL (ref 0.1–1.0)
Monocytes Relative: 7 % (ref 3–12)
Neutro Abs: 5 10*3/uL (ref 1.7–7.7)
Neutrophils Relative %: 58 % (ref 43–77)
Platelets: 232 10*3/uL (ref 150–400)
RBC: 4.84 MIL/uL (ref 3.87–5.11)
RDW: 13.2 % (ref 11.5–15.5)
WBC: 8.6 10*3/uL (ref 4.0–10.5)

## 2014-03-11 LAB — D-DIMER, QUANTITATIVE: D-Dimer, Quant: 0.37 ug/mL-FEU (ref 0.00–0.48)

## 2014-03-11 LAB — I-STAT TROPONIN, ED: Troponin i, poc: 0.01 ng/mL (ref 0.00–0.08)

## 2014-03-11 LAB — PRO B NATRIURETIC PEPTIDE: Pro B Natriuretic peptide (BNP): 64.7 pg/mL (ref 0–125)

## 2014-03-11 MED ORDER — SODIUM CHLORIDE 0.9 % IV BOLUS (SEPSIS)
1000.0000 mL | Freq: Once | INTRAVENOUS | Status: AC
  Administered 2014-03-12: 1000 mL via INTRAVENOUS

## 2014-03-11 NOTE — ED Notes (Signed)
Pt arrived to the ED with a complaint of a cough and bronchitis.  Pt states she has had symptoms for about a week and a half.  Pt states she has been coughing so much to the point that she needs to wear an incontinence pad.

## 2014-03-11 NOTE — ED Provider Notes (Signed)
CSN: [id number]     Arrival date & time 03/11/14  2139 History   First MD Initiated Contact with Patient 03/11/14 2206     Chief Complaint  Patient presents with  . Cough  . Nasal Congestion     (Consider location/radiation/quality/duration/timing/severity/associated sxs/prior Treatment) HPI Comments: Heidi Rivera is a 48 y.o. female with a PMHx of HTN and noncompliant with meds, migraines, vertigo, and obesity, who presents to the ED with complaints of cough x1.5wks with associated mild rhinorrhea, central chest tightness, and SOB, which she states feels like her prior episodes of bronchitis. States she typically gets bronchitis when the weather changes or she goes somewhere cold. Pt states her symptoms gradually developed 1.5wks ago, starting with a dry nonproductive cough, and progressing to have central chest tightness with some back tightness, and SOB/DOE. Chest tightness is 5/10 intermittent worse with coughing, breathing, exertion, or movement, and improved with rest. Additionally she's having some SOB worse when she ambulates or over-exerts herself. Denies radiation of symptoms to her jaw or arm, denies  diaphoresis, LE swelling, estrogen use, recent travel/illness/immobility/surgery, sick contacts, fevers, chills, wheezing, hemoptysis, abd pain, HA, vision changes, syncope, lightheadedness, dizziness, abd pain, n/v/d/c, urinary symptoms, low back pain, myalgias, arthralgias, paresthesias, weakness, numbness, or focal neuro deficits. Denies sore throat, ear or eye complaints, sinus congestion, trismus, drooling, or difficulty swallowing. Has not taken BP meds in 3-95months.  Patient is a 48 y.o. female presenting with cough. The history is provided by the patient. No language interpreter was used.  Cough Cough characteristics:  Dry Severity:  Mild Onset quality:  Gradual Duration:  2 weeks Timing:  Intermittent Progression:  Unchanged Chronicity:  Recurrent Smoker: no     Context: weather changes   Relieved by:  Rest Worsened by:  Activity, deep breathing, environmental changes and exposure to cold air Ineffective treatments:  None tried Associated symptoms: chest pain (central tightness), rhinorrhea and shortness of breath   Associated symptoms: no chills, no diaphoresis, no ear fullness, no ear pain, no eye discharge, no fever, no headaches, no myalgias, no rash, no sinus congestion, no sore throat and no wheezing   Shortness of breath:    Severity:  Mild   Onset quality:  Gradual   Duration:  2 weeks   Timing:  Intermittent   Progression:  Improving Risk factors: no recent infection and no recent travel     Past Medical History  Diagnosis Date  . Hypertension   . Vertigo   . Migraines   . Obesity    Past Surgical History  Procedure Laterality Date  . Hemorrhoid surgery     Family History  Problem Relation Age of Onset  . Diabetes Mother   . Diabetes Maternal Aunt   . Hypertension Father    History  Substance Use Topics  . Smoking status: Never Smoker   . Smokeless tobacco: Never Used  . Alcohol Use: Yes   OB History   Grav Para Term Preterm Abortions TAB SAB Ect Mult Living                 Review of Systems  Constitutional: Negative for fever, chills and diaphoresis.  HENT: Positive for rhinorrhea. Negative for congestion, drooling, ear discharge, ear pain, facial swelling, postnasal drip, sinus pressure, sneezing, sore throat, trouble swallowing and voice change.   Eyes: Negative for photophobia, discharge, redness and visual disturbance.  Respiratory: Positive for cough, chest tightness and shortness of breath. Negative for wheezing and stridor.   Cardiovascular: Positive  for chest pain (central tightness). Negative for palpitations and leg swelling.  Gastrointestinal: Negative for nausea, vomiting, abdominal pain, diarrhea and constipation.  Genitourinary: Negative for dysuria, urgency, frequency, hematuria, flank pain,  decreased urine volume and difficulty urinating.  Musculoskeletal: Positive for back pain (thoracic, around the rib cage posteriorly). Negative for arthralgias, gait problem, joint swelling, myalgias, neck pain and neck stiffness.  Skin: Negative for rash.  Neurological: Negative for dizziness, syncope, weakness, light-headedness, numbness and headaches.  Psychiatric/Behavioral: Negative for confusion.   10 Systems reviewed and are negative for acute change except as noted in the HPI.    Allergies  Review of patient's allergies indicates no known allergies.  Home Medications   Prior to Admission medications   Medication Sig Start Date End Date Taking? Authorizing Provider  cyclobenzaprine (FLEXERIL) 5 MG tablet Take 1 tablet (5 mg total) by mouth 3 (three) times daily as needed for muscle spasms. 03/26/13   Harden Mo, MD  hydrochlorothiazide (HYDRODIURIL) 25 MG tablet Take 1 tablet (25 mg total) by mouth daily. 03/24/13   Lind Covert, MD  HYDROcodone-acetaminophen (NORCO/VICODIN) 5-325 MG per tablet Take 2 tablets by mouth every 4 (four) hours as needed for pain. 10/21/12   Johnna Acosta, MD  HYDROcodone-acetaminophen (NORCO/VICODIN) 5-325 MG per tablet 1 to 2 tabs every 4 to 6 hours as needed for pain. 03/26/13   Harden Mo, MD  ibuprofen (ADVIL,MOTRIN) 200 MG tablet Take 800 mg by mouth every 6 (six) hours as needed.    Historical Provider, MD  methocarbamol (ROBAXIN) 500 MG tablet Take 1 tablet (500 mg total) by mouth 3 (three) times daily. 03/26/13   Harden Mo, MD  naproxen (NAPROSYN) 500 MG tablet Take 1 tablet (500 mg total) by mouth 2 (two) times daily with a meal. 10/21/12   Johnna Acosta, MD  predniSONE (DELTASONE) 20 MG tablet 3 daily for 4 days, 2 daily for 4 days, 1 daily for 4 days 03/26/13   Harden Mo, MD   BP 183/97  Pulse 64  Temp(Src) 98.3 F (36.8 C) (Oral)  Resp 22  SpO2 100%  LMP 02/16/2014 Physical Exam  Nursing note and vitals  reviewed. Constitutional: She is oriented to person, place, and time. She appears well-developed and well-nourished.  Non-toxic appearance. No distress.  Baseline HTN noted, but otherwise VSS, nontoxic, in NAD. Afebrile.   HENT:  Head: Normocephalic and atraumatic.  Nose: Rhinorrhea present. Right sinus exhibits no maxillary sinus tenderness and no frontal sinus tenderness. Left sinus exhibits no maxillary sinus tenderness and no frontal sinus tenderness.  Mouth/Throat: Uvula is midline, oropharynx is clear and moist and mucous membranes are normal. No trismus in the jaw. No uvula swelling.  Nose with mild clear rhinorrhea, patent airways, minimal mucosal edema and erythema. No sinus TTP. Oropharynx clear, no tonsillar swelling or erythema, MMM, no trismus or uvular deviation.  Eyes: Conjunctivae and EOM are normal. Pupils are equal, round, and reactive to light. Right eye exhibits no discharge. Left eye exhibits no discharge.  Neck: Normal range of motion. Neck supple. No JVD present. No spinous process tenderness and no muscular tenderness present. Carotid bruit is not present. No rigidity. Normal range of motion present.  FROM intact without spinous process or paraspinous muscle TTP, no bony stepoffs or deformities, no muscle spasms. No rigidity or meningeal signs. No bruising or swelling. No carotid bruit or JVD  Cardiovascular: Normal rate, regular rhythm, normal heart sounds and intact distal pulses.  Exam reveals  no gallop and no friction rub.   No murmur heard. RRR, nl s1/s2, no m/r/g, distal pulses intact and equal in all extremities  Pulmonary/Chest: Effort normal and breath sounds normal. No accessory muscle usage. No respiratory distress. She has no decreased breath sounds. She has no wheezes. She has no rhonchi. She has no rales. She exhibits tenderness. She exhibits no crepitus, no deformity, no swelling and no retraction.    CTAB in all lung fields, no w/r/r, oxygenation 99% on RA  without increased WOB, speaking in full sentences. Chest wall TTP over sternum and some posterior chest wall tenderness. No crepitus or retraction, no subQ air.  Abdominal: Soft. Normal appearance and bowel sounds are normal. She exhibits no distension. There is no tenderness. There is no rigidity, no rebound, no guarding and no CVA tenderness.  Musculoskeletal: Normal range of motion.  MAE x4 Distal pulses intact No pedal edema or LE swelling/erythema, neg homan's sign bilaterally Ambulatory without assistance  Lymphadenopathy:       Head (right side): No tonsillar adenopathy present.       Head (left side): No tonsillar adenopathy present.    She has no cervical adenopathy.  No head/neck LAD  Neurological: She is alert and oriented to person, place, and time. She has normal strength. No cranial nerve deficit or sensory deficit. Coordination and gait normal. GCS eye subscore is 4. GCS verbal subscore is 5. GCS motor subscore is 6.  A&O x4, CN2-12 grossly intact, strength and sensation at baseline, no focal neuro deficits. Coordination with finger-to-nose WNL. Gait WNL.  Skin: Skin is warm, dry and intact. No rash noted.  Psychiatric: She has a normal mood and affect.    ED Course  Procedures (including critical care time) Labs Review Labs Reviewed  BASIC METABOLIC PANEL - Abnormal; Notable for the following:    Glucose, Bld 100 (*)    BUN 25 (*)    Creatinine, Ser 1.86 (*)    GFR calc non Af Amer 31 (*)    GFR calc Af Amer 36 (*)    All other components within normal limits  CBC WITH DIFFERENTIAL  PRO B NATRIURETIC PEPTIDE  D-DIMER, QUANTITATIVE  I-STAT TROPOININ, ED    Imaging Review Dg Chest 2 View  03/11/2014   CLINICAL DATA:  Congestive coughing for last 11 days, initial evaluation, chest tightness and shortness of breath  EXAM: CHEST  2 VIEW  COMPARISON:  10/21/2012  FINDINGS: Heart size upper normal but stable. Vascular pattern normal. Lungs clear. No pleural effusion.   IMPRESSION: No active cardiopulmonary disease.   Electronically Signed   By: Skipper Cliche M.D.   On: 03/11/2014 23:26     EKG Interpretation   Date/Time:  Saturday March 11 2014 21:46:10 EDT Ventricular Rate:  64 PR Interval:  135 QRS Duration: 81 QT Interval:  443 QTC Calculation: 457 R Axis:   14 Text Interpretation:  Sinus rhythm LVH by voltage Borderline T  abnormalities, anterior leads Baseline wander in lead(s) II III aVR aVF V4  Confirmed by DOCHERTY  MD, MEGAN ([id number]) on 03/11/2014 9:59:30 PM      MDM   Final diagnoses:  Cough  Chest wall pain  Shortness of breath  URI (upper respiratory infection)  Essential hypertension  CKD (chronic kidney disease), unspecified stage    48y/o female with reproducible chest wall pain, and noncompliant on BP meds x3-4 months, here for complaints of bronchitis-like illness x1.5wks. Pt states this feels like her prior episodes of bronchitis,  but given that she has HTN and c/o SOB worse with inspiration, and chest wall pain which she states is also in her back, will obtain labs to r/o end-organ damage. Since pt has not had BP meds in 3 months, will hold on starting any today, especially since BP is consistent with prior visits, and this pain is unlikely cardiac in etiology. Well's score very low, will obtain dimer for r/o of PE/dissection. Clear lung exam, reproducible chest wall pain, extremities with equal pulses, VSS aside from HTN, oxygenating well on RA and HR stable in 60s. Doubt need for nebulizer or oxygen therapy. Doubt need for MONA treatment given low suspicion for ACS/MI since sxs are ongoing x1.5 wks. Pt ambulatory without assistance and no desats in room. Will reassess shortly after labs and CXR. Pt declines wanting pain meds.  12:05 AM Trop WNL, EKG with LVH but otherwise WNL, CBC w/diff unremarkable, BMP showing mildly elevated BUN and Cr, but near baseline compared to prior results therefore will give fluids while here. BNP  WNL, D-dimer WNL therefore no need for CTA. VS remain stable, oxygen saturation stable and HR stable. BP continues to be stable in 180s/90s. Will reassess after fluids. Pt continues to decline meds.  1:15 AM VS continue to remain stable and clear lung exam. HEART score 2, doubt need for admission or cardiac work up. Since pt states this is similar to prior bronchitis episodes, and labs all unremarkable, will treat as bronchitis. Encouraged good oral hydration, heat over chest wall areas that are painful, mucinex and steam to help with expectoration, and inhaler to help with SOB complaint although pt never SOB here. Doubt her exertional pain is angina, but discussed that she needs to f/up with PCP to restart BP meds and stressed importance of f/up. Discussed that if pt requires cardiac work up after bronchitis clears up, PCP would refer to proper cardiology clinic. Doubt need for emergent f/up or further observation here today. Discussed ibuprofen (low dose, as to not aggravate kidneys) and norco for pain, in addition to heat. I explained the diagnosis and have given explicit precautions to return to the ER including for any other new or worsening symptoms. The patient understands and accepts the medical plan as it's been dictated and I have answered their questions. Discharge instructions concerning home care and prescriptions have been given. The patient is STABLE and is discharged to home in good condition.   BP 163/112  Pulse 66  Temp(Src) 98 F (36.7 C) (Oral)  Resp 22  SpO2 100%  LMP 02/14/2014  Meds ordered this encounter  Medications  . sodium chloride 0.9 % bolus 1,000 mL    Sig:   . guaiFENesin (MUCINEX) 600 MG 12 hr tablet    Sig: Take 1 tablet (600 mg total) by mouth 2 (two) times daily as needed for cough or to loosen phlegm.    Dispense:  10 tablet    Refill:  0    Order Specific Question:  Supervising Provider    Answer:  Noemi Chapel D [id number]  . ibuprofen (ADVIL,MOTRIN) 200 MG  tablet    Sig: Take 1 tablet (200 mg total) by mouth every 8 (eight) hours as needed for fever, headache or moderate pain.    Dispense:  10 tablet    Refill:  0    Order Specific Question:  Supervising Provider    Answer:  Noemi Chapel D [id number]  . HYDROcodone-acetaminophen (NORCO) 5-325 MG per tablet    Sig: Take 1  tablet by mouth every 6 (six) hours as needed for severe pain.    Dispense:  6 tablet    Refill:  0    Order Specific Question:  Supervising Provider    Answer:  Noemi Chapel D [id number]  . albuterol (PROVENTIL HFA;VENTOLIN HFA) 108 (90 BASE) MCG/ACT inhaler    Sig: Inhale 2 puffs into the lungs every 2 (two) hours as needed for wheezing or shortness of breath (cough).    Dispense:  1 Inhaler    Refill:  0    Order Specific Question:  Supervising Provider    Answer:  Johnna Acosta [id number]     [place], PA-C 03/12/14 [phone]

## 2014-03-12 MED ORDER — ALBUTEROL SULFATE HFA 108 (90 BASE) MCG/ACT IN AERS: 2.0000 | INHALATION_SPRAY | RESPIRATORY_TRACT | Status: DC | PRN

## 2014-03-12 MED ORDER — HYDROCODONE-ACETAMINOPHEN 5-325 MG PO TABS: 1.0000 | ORAL_TABLET | Freq: Four times a day (QID) | ORAL | Status: DC | PRN

## 2014-03-12 MED ORDER — GUAIFENESIN ER 600 MG PO TB12: 600.0000 mg | ORAL_TABLET | Freq: Two times a day (BID) | ORAL | Status: DC | PRN

## 2014-03-12 MED ORDER — IBUPROFEN 200 MG PO TABS: 200.0000 mg | ORAL_TABLET | Freq: Three times a day (TID) | ORAL | Status: DC | PRN

## 2014-03-12 NOTE — Discharge Instructions (Signed)
Continue to stay well-hydrated. Continue to alternate between Tylenol and Ibuprofen for pain or fever, but use ibuprofen sparingly since your kidney function was a little worse today. Use heat over the areas that are painful in your chest wall. YOU NEED TO SEE YOUR DOCTOR AND RESTART YOUR BLOOD PRESSURE MEDICINES. Use Mucinex for cough suppression/expectoration of mucus. Use inhaler as directed, as needed for cough/chest congestion. Followup with your primary care doctor in 3-5 days for recheck of ongoing symptoms. Return to emergency department for emergent changing or worsening of symptoms.   Chest Wall Pain Chest wall pain is pain in or around the bones and muscles of your chest. It may take up to 6 weeks to get better. It may take longer if you must stay physically active in your work and activities.  CAUSES  Chest wall pain may happen on its own. However, it may be caused by:  A viral illness like the flu.  Injury.  Coughing.  Exercise.  Arthritis.  Fibromyalgia.  Shingles. HOME CARE INSTRUCTIONS   Avoid overtiring physical activity. Try not to strain or perform activities that cause pain. This includes any activities using your chest or your abdominal and side muscles, especially if heavy weights are used.  Put ice on the sore area.  Put ice in a plastic bag.  Place a towel between your skin and the bag.  Leave the ice on for 15-20 minutes per hour while awake for the first 2 days.  Only take over-the-counter or prescription medicines for pain, discomfort, or fever as directed by your caregiver. SEEK IMMEDIATE MEDICAL CARE IF:   Your pain increases, or you are very uncomfortable.  You have a fever.  Your chest pain becomes worse.  You have new, unexplained symptoms.  You have nausea or vomiting.  You feel sweaty or lightheaded.  You have a cough with phlegm (sputum), or you cough up blood. MAKE SURE YOU:   Understand these instructions.  Will watch your  condition.  Will get help right away if you are not doing well or get worse. Document Released: 05/12/2005 Document Revised: 08/04/2011 Document Reviewed: 01/06/2011 [facility] Patient Information 2015 [place], [place]. This information is not intended to replace advice given to you by your health care provider. Make sure you discuss any questions you have with your health care provider.  Costochondritis Costochondritis is a condition in which the tissue (cartilage) that connects your ribs with your breastbone (sternum) becomes irritated. It causes pain in the chest and rib area. It usually goes away on its own over time. HOME CARE  Avoid activities that wear you out.  Do not strain your ribs. Avoid activities that use your:  Chest.  Belly.  Side muscles.  Put ice on the area for the first 2 days after the pain starts.  Put ice in a plastic bag.  Place a towel between your skin and the bag.  Leave the ice on for 20 minutes, 2-3 times a day.  Only take medicine as told by your doctor. GET HELP IF:  You have redness or puffiness (swelling) in the rib area.  Your pain does not go away with rest or medicine. GET HELP RIGHT AWAY IF:   Your pain gets worse.  You are very uncomfortable.  You have trouble breathing.  You cough up blood.  You start sweating or throwing up (vomiting).  You have a fever or lasting symptoms for more than 2-3 days.  You have a fever and your symptoms suddenly  get worse. MAKE SURE YOU:   Understand these instructions.  Will watch your condition.  Will get help right away if you are not doing well or get worse. Document Released: 10/29/2007 Document Revised: 01/12/2013 Document Reviewed: 12/14/2012 [facility] Patient Information 2015 [place], [place]. This information is not intended to replace advice given to you by your health care provider. Make sure you discuss any questions you have with your health care provider.  Cough, Adult  A cough is  a reflex. It helps you clear your throat and airways. A cough can help heal your body. A cough can last 2 or 3 weeks (acute) or may last more than 8 weeks (chronic). Some common causes of a cough can include an infection, allergy, or a cold. HOME CARE  Only take medicine as told by your doctor.  If given, take your medicines (antibiotics) as told. Finish them even if you start to feel better.  Use a cold steam vaporizer or humidifier in your home. This can help loosen thick spit (secretions).  Sleep so you are almost sitting up (semi-upright). Use pillows to do this. This helps reduce coughing.  Rest as needed.  Stop smoking if you smoke. GET HELP RIGHT AWAY IF:  You have yellowish-white fluid (pus) in your thick spit.  Your cough gets worse.  Your medicine does not reduce coughing, and you are losing sleep.  You cough up blood.  You have trouble breathing.  Your pain gets worse and medicine does not help.  You have a fever. MAKE SURE YOU:   Understand these instructions.  Will watch your condition.  Will get help right away if you are not doing well or get worse. Document Released: 01/23/2011 Document Revised: 09/26/2013 Document Reviewed: 01/23/2011 [facility] Patient Information 2015 [place], [place]. This information is not intended to replace advice given to you by your health care provider. Make sure you discuss any questions you have with your health care provider.  Chronic Kidney Disease Chronic kidney disease occurs when the kidneys are damaged over a long period. The kidneys are two organs that lie on either side of the spine between the middle of the back and the front of the abdomen. The kidneys:   Remove wastes and extra water from the blood.   Produce important hormones. These help keep bones strong, regulate blood pressure, and help create red blood cells.   Balance the fluids and chemicals in the blood and tissues. A small amount of kidney damage may not  cause problems, but a large amount of damage may make it difficult or impossible for the kidneys to work the way they should. If steps are not taken to slow down the kidney damage or stop it from getting worse, the kidneys may stop working permanently. Most of the time, chronic kidney disease does not go away. However, it can often be controlled, and those with the disease can usually live normal lives. CAUSES  The most common causes of chronic kidney disease are diabetes and high blood pressure (hypertension). Chronic kidney disease may also be caused by:   Diseases that cause the kidneys' filters to become inflamed.   Diseases that affect the immune system.   Genetic diseases.   Medicines that damage the kidneys, such as anti-inflammatory medicines.  Poisoning or exposure to toxic substances.   A reoccurring kidney or urinary infection.   A problem with urine flow. This may be caused by:   Cancer.   Kidney stones.   An enlarged prostate in  males. SIGNS AND SYMPTOMS  Because the kidney damage in chronic kidney disease occurs slowly, symptoms develop slowly and may not be obvious until the kidney damage becomes severe. A person may have a kidney disease for years without showing any symptoms. Symptoms can include:   Swelling (edema) of the legs, ankles, or feet.   Tiredness (lethargy).   Nausea or vomiting.   Confusion.   Problems with urination, such as:   Decreased urine production.   Frequent urination, especially at night.   Frequent accidents in children who are potty trained.   Muscle twitches and cramps.   Shortness of breath.  Weakness.   Persistent itchiness.   Loss of appetite.  Metallic taste in the mouth.  Trouble sleeping.  Slowed development in children.  Short stature in children. DIAGNOSIS  Chronic kidney disease may be detected and diagnosed by tests, including blood, urine, imaging, or kidney biopsy tests.  TREATMENT   Most chronic kidney diseases cannot be cured. Treatment usually involves relieving symptoms and preventing or slowing the progression of the disease. Treatment may include:   A special diet. You may need to avoid alcohol and foods thatare salty and high in potassium.   Medicines. These may:   Lower blood pressure.   Relieve anemia.   Relieve swelling.   Protect the bones. HOME CARE INSTRUCTIONS   Follow your prescribed diet.   Take medicines only as directed by your health care provider. Do not take any new medicines (prescription, over-the-counter, or nutritional supplements) unless approved by your health care provider. Many medicines can worsen your kidney damage or need to have the dose adjusted.   Quit smoking if you smoke. Talk to your health care provider about a smoking cessation program.   Keep all follow-up visits as directed by your health care provider. SEEK IMMEDIATE MEDICAL CARE IF:  Your symptoms get worse or you develop new symptoms.   You develop symptoms of end-stage kidney disease. These include:   Headaches.   Abnormally dark or light skin.   Numbness in the hands or feet.   Easy bruising.   Frequent hiccups.   Menstruation stops.   You have a fever.   You have decreased urine production.   You havepain or bleeding when urinating. MAKE SURE YOU:  Understand these instructions.  Will watch your condition.  Will get help right away if you are not doing well or get worse. FOR MORE INFORMATION   American Association of Kidney Patients: BombTimer.gl  National Kidney Foundation: www.kidney.[place]: https://mathis.com/  [facility]: www.lifeoptions.org and www.kidneyschool.org Document Released: 02/19/2008 Document Revised: 09/26/2013 Document Reviewed: 01/09/2012 [facility] Patient Information 2015 [place], [place]. This information is not intended to replace advice given to you by your  health care provider. Make sure you discuss any questions you have with your health care provider.  How to Take Your Blood Pressure HOW DO I GET A BLOOD PRESSURE MACHINE?  You can buy an electronic home blood pressure machine at your local pharmacy. Insurance will sometimes cover the cost if you have a prescription.  Ask your doctor what type of machine is best for you. There are different machines for your arm and your wrist.  If you decide to buy a machine to check your blood pressure on your arm, first check the size of your arm so you can buy the right size cuff. To check the size of your arm:   Use a measuring tape that shows both inches and  centimeters.   Wrap the measuring tape around the upper-middle part of your arm. You may need someone to help you measure.   Write down your arm measurement in both inches and centimeters.   To measure your blood pressure correctly, it is important to have the right size cuff.   If your arm is up to 13 inches (up to 34 centimeters), get an adult cuff size.  If your arm is 13 to 17 inches (35 to 44 centimeters), get a large adult cuff size.    If your arm is 17 to 20 inches (45 to 52 centimeters), get an adult thigh cuff.  WHAT DO THE NUMBERS MEAN?   There are two numbers that make up your blood pressure. For example: 120/80.  The first number (120 in our example) is called the "systolic pressure." It is a measure of the pressure in your blood vessels when your heart is pumping blood.  The second number (80 in our example) is called the "diastolic pressure." It is a measure of the pressure in your blood vessels when your heart is resting between beats.  Your doctor will tell you what your blood pressure should be. WHAT SHOULD I DO BEFORE I CHECK MY BLOOD PRESSURE?   Try to rest or relax for at least 30 minutes before you check your blood pressure.  Do not smoke.  Do not have any drinks with caffeine, such  as:  Soda.  Coffee.  Tea.  Check your blood pressure in a quiet room.  Sit down and stretch out your arm on a table. Keep your arm at about the level of your heart. Let your arm relax.  Make sure that your legs are not crossed. HOW DO I CHECK MY BLOOD PRESSURE?  Follow the directions that came with your machine.  Make sure you remove any tight-fighting clothing from your arm or wrist. Wrap the cuff around your upper arm or wrist. You should be able to fit a finger between the cuff and your arm. If you cannot fit a finger between the cuff and your arm, it is too tight and should be removed and rewrapped.  Some units require you to manually pump up the arm cuff.  Automatic units inflate the cuff when you press a button.  Cuff deflation is automatic in both models.  After the cuff is inflated, the unit measures your blood pressure and pulse. The readings are shown on a monitor. Hold still and breathe normally while the cuff is inflated.  Getting a reading takes less than a minute.  Some models store readings in a memory. Some provide a printout of readings. If your machine does not store your readings, keep a written record.  Take readings with you to your next visit with your doctor. Document Released: 04/24/2008 Document Revised: 09/26/2013 Document Reviewed: 07/07/2013 [facility] Patient Information 2015 [place], [place]. This information is not intended to replace advice given to you by your health care provider. Make sure you discuss any questions you have with your health care provider.  Hypertension Hypertension, commonly called high blood pressure, is when the force of blood pumping through your arteries is too strong. Your arteries are the blood vessels that carry blood from your heart throughout your body. A blood pressure reading consists of a higher number over a lower number, such as 110/72. The higher number (systolic) is the pressure inside your arteries when your heart  pumps. The lower number (diastolic) is the pressure inside your arteries when  your heart relaxes. Ideally you want your blood pressure below 120/80. Hypertension forces your heart to work harder to pump blood. Your arteries may become narrow or stiff. Having hypertension puts you at risk for heart disease, stroke, and other problems.  RISK FACTORS Some risk factors for high blood pressure are controllable. Others are not.  Risk factors you cannot control include:   Race. You may be at higher risk if you are African American.  Age. Risk increases with age.  Gender. Men are at higher risk than women before age 40 years. After age 62, women are at higher risk than men. Risk factors you can control include:  Not getting enough exercise or physical activity.  Being overweight.  Getting too much fat, sugar, calories, or salt in your diet.  Drinking too much alcohol. SIGNS AND SYMPTOMS Hypertension does not usually cause signs or symptoms. Extremely high blood pressure (hypertensive crisis) may cause headache, anxiety, shortness of breath, and nosebleed. DIAGNOSIS  To check if you have hypertension, your health care provider will measure your blood pressure while you are seated, with your arm held at the level of your heart. It should be measured at least twice using the same arm. Certain conditions can cause a difference in blood pressure between your right and left arms. A blood pressure reading that is higher than normal on one occasion does not mean that you need treatment. If one blood pressure reading is high, ask your health care provider about having it checked again. TREATMENT  Treating high blood pressure includes making lifestyle changes and possibly taking medicine. Living a healthy lifestyle can help lower high blood pressure. You may need to change some of your habits. Lifestyle changes may include:  Following the DASH diet. This diet is high in fruits, vegetables, and whole  grains. It is low in salt, red meat, and added sugars.  Getting at least 2 hours of brisk physical activity every week.  Losing weight if necessary.  Not smoking.  Limiting alcoholic beverages.  Learning ways to reduce stress. If lifestyle changes are not enough to get your blood pressure under control, your health care provider may prescribe medicine. You may need to take more than one. Work closely with your health care provider to understand the risks and benefits. HOME CARE INSTRUCTIONS  Have your blood pressure rechecked as directed by your health care provider.   Take medicines only as directed by your health care provider. Follow the directions carefully. Blood pressure medicines must be taken as prescribed. The medicine does not work as well when you skip doses. Skipping doses also puts you at risk for problems.   Do not smoke.   Monitor your blood pressure at home as directed by your health care provider. SEEK MEDICAL CARE IF:   You think you are having a reaction to medicines taken.  You have recurrent headaches or feel dizzy.  You have swelling in your ankles.  You have trouble with your vision. SEEK IMMEDIATE MEDICAL CARE IF:  You develop a severe headache or confusion.  You have unusual weakness, numbness, or feel faint.  You have severe chest or abdominal pain.  You vomit repeatedly.  You have trouble breathing. MAKE SURE YOU:   Understand these instructions.  Will watch your condition.  Will get help right away if you are not doing well or get worse. Document Released: 05/12/2005 Document Revised: 09/26/2013 Document Reviewed: 03/04/2013 [facility] Patient Information 2015 [place], [place]. This information is not intended to replace  advice given to you by your health care provider. Make sure you discuss any questions you have with your health care provider.  Managing Your High Blood Pressure Blood pressure is a measurement of how forceful your  blood is pressing against the walls of the arteries. Arteries are muscular tubes within the circulatory system. Blood pressure does not stay the same. Blood pressure rises when you are active, excited, or nervous; and it lowers during sleep and relaxation. If the numbers measuring your blood pressure stay above normal most of the time, you are at risk for health problems. High blood pressure (hypertension) is a long-term (chronic) condition in which blood pressure is elevated. A blood pressure reading is recorded as two numbers, such as 120 over 80 (or 120/80). The first, higher number is called the systolic pressure. It is a measure of the pressure in your arteries as the heart beats. The second, lower number is called the diastolic pressure. It is a measure of the pressure in your arteries as the heart relaxes between beats.  Keeping your blood pressure in a normal range is important to your overall health and prevention of health problems, such as heart disease and stroke. When your blood pressure is uncontrolled, your heart has to work harder than normal. High blood pressure is a very common condition in adults because blood pressure tends to rise with age. Men and women are equally likely to have hypertension but at different times in life. Before age 11, men are more likely to have hypertension. After 48 years of age, women are more likely to have it. Hypertension is especially common in African Americans. This condition often has no signs or symptoms. The cause of the condition is usually not known. Your caregiver can help you come up with a plan to keep your blood pressure in a normal, healthy range. BLOOD PRESSURE STAGES Blood pressure is classified into four stages: normal, prehypertension, stage 1, and stage 2. Your blood pressure reading will be used to determine what type of treatment, if any, is necessary. Appropriate treatment options are tied to these four stages:  Normal  Systolic pressure  (mm Hg): below 120.  Diastolic pressure (mm Hg): below 80. Prehypertension  Systolic pressure (mm Hg): 120 to 139.  Diastolic pressure (mm Hg): 80 to 89. Stage1  Systolic pressure (mm Hg): 140 to 159.  Diastolic pressure (mm Hg): 90 to 99. Stage2  Systolic pressure (mm Hg): 160 or above.  Diastolic pressure (mm Hg): 100 or above. RISKS RELATED TO HIGH BLOOD PRESSURE Managing your blood pressure is an important responsibility. Uncontrolled high blood pressure can lead to:  A heart attack.  A stroke.  A weakened blood vessel (aneurysm).  Heart failure.  Kidney damage.  Eye damage.  Metabolic syndrome.  Memory and concentration problems. HOW TO MANAGE YOUR BLOOD PRESSURE Blood pressure can be managed effectively with lifestyle changes and medicines (if needed). Your caregiver will help you come up with a plan to bring your blood pressure within a normal range. Your plan should include the following: Education  Read all information provided by your caregivers about how to control blood pressure.  Educate yourself on the latest guidelines and treatment recommendations. New research is always being done to further define the risks and treatments for high blood pressure. Lifestylechanges  Control your weight.  Avoid smoking.  Stay physically active.  Reduce the amount of salt in your diet.  Reduce stress.  Control any chronic conditions, such as high cholesterol or  diabetes.  Reduce your alcohol intake. Medicines  Several medicines (antihypertensive medicines) are available, if needed, to bring blood pressure within a normal range. Communication  Review all the medicines you take with your caregiver because there may be side effects or interactions.  Talk with your caregiver about your diet, exercise habits, and other lifestyle factors that may be contributing to high blood pressure.  See your caregiver regularly. Your caregiver can help you create and  adjust your plan for managing high blood pressure. RECOMMENDATIONS FOR TREATMENT AND FOLLOW-UP  The following recommendations are based on current guidelines for managing high blood pressure in nonpregnant adults. Use these recommendations to identify the proper follow-up period or treatment option based on your blood pressure reading. You can discuss these options with your caregiver.  Systolic pressure of 123456 to [id number] or diastolic pressure of 80 to 89: Follow up with your caregiver as directed.  Systolic pressure of [id number] to 0000000 or diastolic pressure of 90 to 100: Follow up with your caregiver within 2 months.  Systolic pressure above 0000000 or diastolic pressure above [id number]: Follow up with your caregiver within 1 month.  Systolic pressure above [id number] or diastolic pressure above [id number]: Consider antihypertensive therapy; follow up with your caregiver within 1 week.  Systolic pressure above [id number] or diastolic pressure above 123456: Begin antihypertensive therapy; follow up with your caregiver within 1 week. Document Released: 02/04/2012 Document Reviewed: 02/04/2012 [facility] Patient Information 2015 [place]. This information is not intended to replace advice given to you by your health care provider. Make sure you discuss any questions you have with your health care provider.  Shortness of Breath Shortness of breath means you have trouble breathing. It could also mean that you have a medical problem. You should get immediate medical care for shortness of breath. CAUSES   Not enough oxygen in the air such as with high altitudes or a smoke-filled room.  Certain lung diseases, infections, or problems.  Heart disease or conditions, such as angina or heart failure.  Low red blood cells (anemia).  Poor physical fitness, which can cause shortness of breath when you exercise.  Chest or back injuries or stiffness.  Being overweight.  Smoking.  Anxiety, which can make you feel like you are not  getting enough air. DIAGNOSIS  Serious medical problems can often be found during your physical exam. Tests may also be done to determine why you are having shortness of breath. Tests may include:  Chest X-rays.  Lung function tests.  Blood tests.  An electrocardiogram (ECG).  An ambulatory electrocardiogram. An ambulatory ECG records your heartbeat patterns over a 24-hour period.  Exercise testing.  A transthoracic echocardiogram (TTE). During echocardiography, sound waves are used to evaluate how blood flows through your heart.  A transesophageal echocardiogram (TEE).  Imaging scans. Your health care provider may not be able to find a cause for your shortness of breath after your exam. In this case, it is important to have a follow-up exam with your health care provider as directed.  TREATMENT  Treatment for shortness of breath depends on the cause of your symptoms and can vary greatly. HOME CARE INSTRUCTIONS   Do not smoke. Smoking is a common cause of shortness of breath. If you smoke, ask for help to quit.  Avoid being around chemicals or things that may bother your breathing, such as paint fumes and dust.  Rest as needed. Slowly resume your usual activities.  If medicines were prescribed, take them as directed for  the full length of time directed. This includes oxygen and any inhaled medicines.  Keep all follow-up appointments as directed by your health care provider. SEEK MEDICAL CARE IF:   Your condition does not improve in the time expected.  You have a hard time doing your normal activities even with rest.  You have any new symptoms. SEEK IMMEDIATE MEDICAL CARE IF:   Your shortness of breath gets worse.  You feel light-headed, faint, or develop a cough not controlled with medicines.  You start coughing up blood.  You have pain with breathing.  You have chest pain or pain in your arms, shoulders, or abdomen.  You have a fever.  You are unable to walk  up stairs or exercise the way you normally do. MAKE SURE YOU:  Understand these instructions.  Will watch your condition.  Will get help right away if you are not doing well or get worse. Document Released: 02/04/2001 Document Revised: 05/17/2013 Document Reviewed: 07/28/2011 [facility] Patient Information 2015 [place], [place]. This information is not intended to replace advice given to you by your health care provider. Make sure you discuss any questions you have with your health care provider.  Upper Respiratory Infection, Adult An upper respiratory infection (URI) is also sometimes known as the common cold. The upper respiratory tract includes the nose, sinuses, throat, trachea, and bronchi. Bronchi are the airways leading to the lungs. Most people improve within 1 week, but symptoms can last up to 2 weeks. A residual cough may last even longer.  CAUSES Many different viruses can infect the tissues lining the upper respiratory tract. The tissues become irritated and inflamed and often become very moist. Mucus production is also common. A cold is contagious. You can easily spread the virus to others by oral contact. This includes kissing, sharing a glass, coughing, or sneezing. Touching your mouth or nose and then touching a surface, which is then touched by another person, can also spread the virus. SYMPTOMS  Symptoms typically develop 1 to 3 days after you come in contact with a cold virus. Symptoms vary from person to person. They may include:  Runny nose.  Sneezing.  Nasal congestion.  Sinus irritation.  Sore throat.  Loss of voice (laryngitis).  Cough.  Fatigue.  Muscle aches.  Loss of appetite.  Headache.  Low-grade fever. DIAGNOSIS  You might diagnose your own cold based on familiar symptoms, since most people get a cold 2 to 3 times a year. Your caregiver can confirm this based on your exam. Most importantly, your caregiver can check that your symptoms are not due to  another disease such as strep throat, sinusitis, pneumonia, asthma, or epiglottitis. Blood tests, throat tests, and X-rays are not necessary to diagnose a common cold, but they may sometimes be helpful in excluding other more serious diseases. Your caregiver will decide if any further tests are required. RISKS AND COMPLICATIONS  You may be at risk for a more severe case of the common cold if you smoke cigarettes, have chronic heart disease (such as heart failure) or lung disease (such as asthma), or if you have a weakened immune system. The very young and very old are also at risk for more serious infections. Bacterial sinusitis, middle ear infections, and bacterial pneumonia can complicate the common cold. The common cold can worsen asthma and chronic obstructive pulmonary disease (COPD). Sometimes, these complications can require emergency medical care and may be life-threatening. PREVENTION  The best way to protect against getting a cold is  to practice good hygiene. Avoid oral or hand contact with people with cold symptoms. Wash your hands often if contact occurs. There is no clear evidence that vitamin C, vitamin E, echinacea, or exercise reduces the chance of developing a cold. However, it is always recommended to get plenty of rest and practice good nutrition. TREATMENT  Treatment is directed at relieving symptoms. There is no cure. Antibiotics are not effective, because the infection is caused by a virus, not by bacteria. Treatment may include:  Increased fluid intake. Sports drinks offer valuable electrolytes, sugars, and fluids.  Breathing heated mist or steam (vaporizer or shower).  Eating chicken soup or other clear broths, and maintaining good nutrition.  Getting plenty of rest.  Using gargles or lozenges for comfort.  Controlling fevers with ibuprofen or acetaminophen as directed by your caregiver.  Increasing usage of your inhaler if you have asthma. Zinc gel and zinc lozenges,  taken in the first 24 hours of the common cold, can shorten the duration and lessen the severity of symptoms. Pain medicines may help with fever, muscle aches, and throat pain. A variety of non-prescription medicines are available to treat congestion and runny nose. Your caregiver can make recommendations and may suggest nasal or lung inhalers for other symptoms.  HOME CARE INSTRUCTIONS   Only take over-the-counter or prescription medicines for pain, discomfort, or fever as directed by your caregiver.  Use a warm mist humidifier or inhale steam from a shower to increase air moisture. This may keep secretions moist and make it easier to breathe.  Drink enough water and fluids to keep your urine clear or pale yellow.  Rest as needed.  Return to work when your temperature has returned to normal or as your caregiver advises. You may need to stay home longer to avoid infecting others. You can also use a face mask and careful hand washing to prevent spread of the virus. SEEK MEDICAL CARE IF:   After the first few days, you feel you are getting worse rather than better.  You need your caregiver's advice about medicines to control symptoms.  You develop chills, worsening shortness of breath, or brown or red sputum. These may be signs of pneumonia.  You develop yellow or brown nasal discharge or pain in the face, especially when you bend forward. These may be signs of sinusitis.  You develop a fever, swollen neck glands, pain with swallowing, or white areas in the back of your throat. These may be signs of strep throat. SEEK IMMEDIATE MEDICAL CARE IF:   You have a fever.  You develop severe or persistent headache, ear pain, sinus pain, or chest pain.  You develop wheezing, a prolonged cough, cough up blood, or have a change in your usual mucus (if you have chronic lung disease).  You develop sore muscles or a stiff neck. Document Released: 11/05/2000 Document Revised: 08/04/2011 Document  Reviewed: 08/17/2013 [facility] Patient Information 2015 [place], [place]. This information is not intended to replace advice given to you by your health care provider. Make sure you discuss any questions you have with your health care provider.

## 2014-03-13 ENCOUNTER — Other Ambulatory Visit: Payer: Self-pay | Admitting: Family Medicine

## 2014-03-13 DIAGNOSIS — I10 Essential (primary) hypertension: Secondary | ICD-10-CM

## 2014-03-13 NOTE — ED Provider Notes (Signed)
Medical screening examination/treatment/procedure(s) were performed by non-physician practitioner and as supervising physician I was immediately available for consultation/collaboration.   EKG Interpretation   Date/Time:  Saturday March 11 2014 21:46:10 EDT Ventricular Rate:  64 PR Interval:  135 QRS Duration: 81 QT Interval:  443 QTC Calculation: 457 R Axis:   14 Text Interpretation:  Sinus rhythm LVH by voltage Borderline T  abnormalities, anterior leads Baseline wander in lead(s) II III aVR aVF V4  Confirmed by DOCHERTY  MD, MEGAN ([id number]) on 03/11/2014 9:59:30 PM       Charlesetta Shanks, MD 03/13/14 0003

## 2014-03-13 NOTE — Telephone Encounter (Signed)
Pt is out of her BP mediation and would like this refill until she get an appointment with her doctor. jw

## 2014-03-14 NOTE — Telephone Encounter (Signed)
Pt called to check the status of her BP medication request. She is currently out of town and would like it sent to the [facility] on [address] their address is [address] jw

## 2014-03-15 NOTE — Telephone Encounter (Signed)
Spoke with patient and informed her that she would need an appt before refilling medication.  States that she doesn't have any insurance but plans to make an appt when she returns to town in 7 weeks.  She is dealing with a family emergency and would like to know if the ED visit is enough to get her a refill.  Informed patient that I would ask but couldn't guarantee anything. Jazmin Hartsell,CMA

## 2014-03-15 NOTE — Telephone Encounter (Signed)
She has not been seen here is over 2 years. Last time she called to get med refilled she was advised to make appointment prior to additional refills being provided. No refills will be given until she is seen in clinic. Please call to inform her.   Thanks,  Dr Berkley Harvey

## 2014-10-27 ENCOUNTER — Emergency Department
Admission: EM | Admit: 2014-10-27 | Discharge: 2014-10-27 | Disposition: A | Discharge status: Home / Self Care | Payer: Self-pay | Attending: Emergency Medicine | Admitting: Emergency Medicine

## 2014-10-27 ENCOUNTER — Encounter: Payer: Self-pay | Admitting: Emergency Medicine

## 2014-10-27 ENCOUNTER — Emergency Department: Payer: Self-pay

## 2014-10-27 DIAGNOSIS — I129 Hypertensive chronic kidney disease with stage 1 through stage 4 chronic kidney disease, or unspecified chronic kidney disease: Secondary | ICD-10-CM | POA: Insufficient documentation

## 2014-10-27 DIAGNOSIS — J40 Bronchitis, not specified as acute or chronic: Secondary | ICD-10-CM

## 2014-10-27 DIAGNOSIS — Z7952 Long term (current) use of systemic steroids: Secondary | ICD-10-CM | POA: Insufficient documentation

## 2014-10-27 DIAGNOSIS — N183 Chronic kidney disease, stage 3 (moderate): Secondary | ICD-10-CM | POA: Insufficient documentation

## 2014-10-27 DIAGNOSIS — Z79899 Other long term (current) drug therapy: Secondary | ICD-10-CM | POA: Insufficient documentation

## 2014-10-27 MED ORDER — ALBUTEROL SULFATE HFA 108 (90 BASE) MCG/ACT IN AERS: 2.0000 | INHALATION_SPRAY | RESPIRATORY_TRACT | Status: DC | PRN

## 2014-10-27 MED ORDER — PREDNISONE 20 MG PO TABS: 40.0000 mg | ORAL_TABLET | Freq: Every day | ORAL | Status: DC

## 2014-10-27 MED ORDER — IPRATROPIUM-ALBUTEROL 0.5-2.5 (3) MG/3ML IN SOLN
RESPIRATORY_TRACT | Status: AC
  Filled 2014-10-27: qty 3

## 2014-10-27 MED ORDER — IPRATROPIUM-ALBUTEROL 0.5-2.5 (3) MG/3ML IN SOLN
3.0000 mL | Freq: Once | RESPIRATORY_TRACT | Status: AC
  Administered 2014-10-27: 3 mL via RESPIRATORY_TRACT

## 2014-10-27 MED ORDER — GUAIFENESIN-CODEINE 100-10 MG/5ML PO SOLN: 5.0000 mL | Freq: Three times a day (TID) | ORAL | Status: DC | PRN

## 2014-10-27 MED ORDER — PREDNISONE 20 MG PO TABS
40.0000 mg | ORAL_TABLET | Freq: Once | ORAL | Status: AC
  Administered 2014-10-27: 40 mg via ORAL

## 2014-10-27 MED ORDER — PREDNISONE 20 MG PO TABS
ORAL_TABLET | ORAL | Status: AC
  Filled 2014-10-27: qty 2

## 2014-10-27 NOTE — ED Provider Notes (Signed)
[facility] Emergency Department Provider Note  ____________________________________________  Time seen: Approximately 10:36 AM  I have reviewed the triage vital signs and the nursing notes.   HISTORY  Chief Complaint Cough   HPI Heidi Rivera is a 49 y.o. female presents to the ER for complaints of 2-3 week of cough, congestion and intermittent wheeze. Patient states the cough is worse at night and the wheezing is also worse at night.  States that she called her primary care physician last week and he started her on azithromycin 5 days. States that that did not relieve cough. States that PCP then gave her prescription for [facility] which also did not resolve cough. Patient states that she has not actually been seen by her PCP but this is all given to her by telephone.   Denies chest pain, shortness of breath, abdominal pain or fever. Reports continues to eat and drink well. States cough is also causing her to be sore on her sides.   Past Medical History  Diagnosis Date  . Hypertension   . Vertigo   . Migraines   . Obesity     Patient Active Problem List   Diagnosis Date Noted  . Rotator cuff injury 06/04/2011  . MIGRAINE WITH AURA 07/11/2008  . CHRONIC KIDNEY DISEASE STAGE III (MODERATE) 07/11/2008  . ESSENTIAL HYPERTENSION, BENIGN 08/30/2007  . OBESITY, NOS 07/23/2006    Past Surgical History  Procedure Laterality Date  . Hemorrhoid surgery      Current Outpatient Rx  Name  Route  Sig  Dispense  Refill  . albuterol (PROVENTIL HFA;VENTOLIN HFA) 108 (90 BASE) MCG/ACT inhaler   Inhalation   Inhale 2 puffs into the lungs every 4 (four) hours as needed for wheezing or shortness of breath.   1 Inhaler   0   . guaiFENesin-codeine 100-10 MG/5ML syrup   Oral   Take 5 mLs by mouth 3 (three) times daily as needed for cough.   50 mL   0   . hydrochlorothiazide (HYDRODIURIL) 25 MG tablet   Oral   Take 1 tablet (25 mg total) by  mouth daily.   30 tablet   0   . predniSONE (DELTASONE) 20 MG tablet   Oral   Take 2 tablets (40 mg total) by mouth daily.   10 tablet   0     Allergies Review of patient's allergies indicates no known allergies.  Family History  Problem Relation Age of Onset  . Diabetes Mother   . Diabetes Maternal Aunt   . Hypertension Father     Social History History  Substance Use Topics  . Smoking status: Never Smoker   . Smokeless tobacco: Never Used  . Alcohol Use: Yes    Review of Systems Constitutional: No fever/chills Eyes: No visual changes. ENT: No sore throat. Positive for congestion Cardiovascular: Denies chest pain. Respiratory: Denies shortness of breath.postiive for cough Gastrointestinal: No abdominal pain.  No nausea, no vomiting.  No diarrhea.  No constipation. Genitourinary: Negative for dysuria. Musculoskeletal: Negative for back pain. Skin: Negative for rash. Neurological: Negative for headaches, focal weakness or numbness.  10-point ROS otherwise negative.  ____________________________________________   PHYSICAL EXAM:  VITAL SIGNS: ED Triage Vitals  Enc Vitals Group     BP 10/27/14 1023 141/82 mmHg     Pulse Rate 10/27/14 1023 54     Resp -- 18     Temp 10/27/14 1023 98.5 F (36.9 C)     Temp Source 10/27/14  1023 Oral     SpO2 10/27/14 1023 98 %     Weight 10/27/14 1023 290 lb (131.543 kg)     Height 10/27/14 1023 5' 9.5" (1.765 m)     Head Cir --      Peak Flow --      Pain Score 10/27/14 1025 3     Pain Loc --      Pain Edu? --      Excl. in [place]? --     Constitutional: Alert and oriented. Well appearing and in no acute distress. Eyes: Conjunctivae are normal. PERRL. EOMI. Head: Atraumatic. Ears: no erythema, normal TMs Nose:mild rhinorrhea Mouth/Throat: Mucous membranes are moist.  Oropharynx non-erythematous. Neck: No stridor.  No cervical spine tenderness to palpation. Hematological/Lymphatic/Immunilogical: No cervical  lymphadenopathy. Cardiovascular: Normal rate, regular rhythm. Grossly normal heart sounds.  Good peripheral circulation. Respiratory: Normal respiratory effort.  No retractions. Lungs clear throughout except mild scattered wheezes with cough only. Dry intermittent cough in room.  Gastrointestinal: Soft and nontender. No distention. No abdominal bruits. No CVA tenderness. Musculoskeletal: No lower extremity tenderness nor edema.  No joint effusions. No edema. Neurologic:  Normal speech and language. No gross focal neurologic deficits are appreciated. Speech is normal. No gait instability. Skin:  Skin is warm, dry and intact. No rash noted. Psychiatric: Mood and affect are normal. Speech and behavior are normal.  _ RADIOLOGY  CHEST 2 VIEW  COMPARISON: 03/11/2014.  FINDINGS: The lungs are clear without focal infiltrate, edema, pneumothorax or pleural effusion. The cardiopericardial silhouette is within normal limits for size. Imaged bony structures of the thorax are intact.  IMPRESSION: Normal exam.   Electronically Signed By: Misty Stanley M.D. On: 10/27/2014 11:08 ____________________________________________  _________________________________________   INITIAL IMPRESSION / ASSESSMENT AND PLAN / ED COURSE  Pertinent labs & imaging results that were available during my care of the patient were reviewed by me and considered in my medical decision making (see chart for details).  Well appearing. No acute distress. Albuterol/ipratropium neb x 1 in ER, pt reports much improved after neb and reexamined with wheezing fully resolved. Chest xray negative for acute changes. No signs of infection.   Will treat bronchitis with home albuterol inhaler as needed, prednisone, and antitussive. Follow up with primary care physician. Discussed return parameters. Patient agreed.  ____________________________________________   FINAL CLINICAL IMPRESSION(S) / ED DIAGNOSES  Final diagnoses:   Bronchitis      Marylene Land, NP 10/27/14 1157  Nance Pear, MD 10/27/14 [id number]

## 2014-10-27 NOTE — ED Notes (Signed)
Pt states she has had cough and congestion x 2 weeks, hx of bronchitis, has been on antibiotic and cough medication from her PCP but has not improved

## 2014-10-27 NOTE — Discharge Instructions (Signed)
Take medication as prescribed. Drink plenty of water at home. Rest.  Follow-up the primary care physician next week.  Return to the ER for new or worsening concerns.

## 2015-03-06 ENCOUNTER — Emergency Department (HOSPITAL_COMMUNITY)
Admission: EM | Admit: 2015-03-06 | Discharge: 2015-03-06 | Disposition: A | Discharge status: Home / Self Care | Payer: Self-pay | Attending: Emergency Medicine | Admitting: Emergency Medicine

## 2015-03-06 DIAGNOSIS — M7551 Bursitis of right shoulder: Secondary | ICD-10-CM | POA: Insufficient documentation

## 2015-03-06 DIAGNOSIS — Z79899 Other long term (current) drug therapy: Secondary | ICD-10-CM | POA: Insufficient documentation

## 2015-03-06 DIAGNOSIS — I10 Essential (primary) hypertension: Secondary | ICD-10-CM | POA: Insufficient documentation

## 2015-03-06 DIAGNOSIS — E669 Obesity, unspecified: Secondary | ICD-10-CM | POA: Insufficient documentation

## 2015-03-06 MED ORDER — PREDNISONE 20 MG PO TABS
60.0000 mg | ORAL_TABLET | Freq: Once | ORAL | Status: AC
  Administered 2015-03-06: 60 mg via ORAL
  Filled 2015-03-06: qty 3

## 2015-03-06 MED ORDER — TRAMADOL HCL 50 MG PO TABS: 50.0000 mg | ORAL_TABLET | Freq: Four times a day (QID) | ORAL | Status: DC | PRN

## 2015-03-06 MED ORDER — PREDNISONE 10 MG PO TABS: 20.0000 mg | ORAL_TABLET | Freq: Every day | ORAL | Status: DC

## 2015-03-06 MED ORDER — TRAMADOL HCL 50 MG PO TABS
50.0000 mg | ORAL_TABLET | Freq: Once | ORAL | Status: AC
  Administered 2015-03-06: 50 mg via ORAL
  Filled 2015-03-06: qty 1

## 2015-03-06 NOTE — Discharge Instructions (Signed)
Bursitis Bursitis is when the fluid-filled sac (bursa) that covers and protects a joint is swollen (inflamed). Bursitis is most common near joints, especially the knees, elbows, hips, and shoulders.  HOME CARE  Take medicines only as told by your doctor.  If you were prescribed an antibiotic medicine, finish it all even if you start to feel better.  Rest the affected area as told by your doctor.  Keep the area raised up.  Avoid doing things that make the pain worse.  Apply ice to the injured area:  Place ice in a plastic bag.  Place a towel between your skin and the bag.  Leave the ice on for 20 minutes, 2-3 times a day.  Use splints, braces, pads, or walking aids as told by your doctor.  Keep all follow-up visits as told by your doctor. This is important. GET HELP IF:   You have more pain with home care.  You have a fever.  You have chills.   This information is not intended to replace advice given to you by your health care provider. Make sure you discuss any questions you have with your health care provider.   Document Released: 10/30/2009 Document Revised: 06/02/2014 Document Reviewed: 08/01/2013 Elsevier Interactive Patient Education 2016 [employer]  Generic Shoulder Exercises EXERCISES  RANGE OF MOTION (ROM) AND STRETCHING EXERCISES These exercises may help you when beginning to rehabilitate your injury. Your symptoms may resolve with or without further involvement from your physician, physical therapist or athletic trainer. While completing these exercises, remember:   Restoring tissue flexibility helps normal motion to return to the joints. This allows healthier, less painful movement and activity.  An effective stretch should be held for at least 30 seconds.  A stretch should never be painful. You should only feel a gentle lengthening or release in the stretched tissue. ROM - Pendulum  Bend at the waist so that your right / left arm falls away from your  body. Support yourself with your opposite hand on a solid surface, such as a table or a countertop.  Your right / left arm should be perpendicular to the ground. If it is not perpendicular, you need to lean over farther. Relax the muscles in your right / left arm and shoulder as much as possible.  Gently sway your hips and trunk so they move your right / left arm without any use of your right / left shoulder muscles.  Progress your movements so that your right / left arm moves side to side, then forward and backward, and finally, both clockwise and counterclockwise.  Complete __________ repetitions in each direction. Many people use this exercise to relieve discomfort in their shoulder as well as to gain range of motion. Repeat __________ times. Complete this exercise __________ times per day. STRETCH - Flexion, Standing  Stand with good posture. With an underhand grip on your right / left hand and an overhand grip on the opposite hand, grasp a broomstick or cane so that your hands are a little more than shoulder-width apart.  Keeping your right / left elbow straight and shoulder muscles relaxed, push the stick with your opposite hand to raise your right / left arm in front of your body and then overhead. Raise your arm until you feel a stretch in your right / left shoulder, but before you have increased shoulder pain.  Try to avoid shrugging your right / left shoulder as your arm rises by keeping your shoulder blade tucked down and toward your  mid-back spine. Hold __________ seconds.  Slowly return to the starting position. Repeat __________ times. Complete this exercise __________ times per day. STRETCH - Internal Rotation  Place your right / left hand behind your back, palm-up.  Throw a towel or belt over your opposite shoulder. Grasp the towel/belt with your right / left hand.  While keeping an upright posture, gently pull up on the towel/belt until you feel a stretch in the front of  your right / left shoulder.  Avoid shrugging your right / left shoulder as your arm rises by keeping your shoulder blade tucked down and toward your mid-back spine.  Hold __________. Release the stretch by lowering your opposite hand. Repeat __________ times. Complete this exercise __________ times per day. STRETCH - External Rotation and Abduction  Stagger your stance through a doorframe. It does not matter which foot is forward.  As instructed by your physician, physical therapist or athletic trainer, place your hands:  And forearms above your head and on the door frame.  And forearms at head-height and on the door frame.  At elbow-height and on the door frame.  Keeping your head and chest upright and your stomach muscles tight to prevent over-extending your low-back, slowly shift your weight onto your front foot until you feel a stretch across your chest and/or in the front of your shoulders.  Hold __________ seconds. Shift your weight to your back foot to release the stretch. Repeat __________ times. Complete this stretch __________ times per day.  STRENGTHENING EXERCISES  These exercises may help you when beginning to rehabilitate your injury. They may resolve your symptoms with or without further involvement from your physician, physical therapist or athletic trainer. While completing these exercises, remember:   Muscles can gain both the endurance and the strength needed for everyday activities through controlled exercises.  Complete these exercises as instructed by your physician, physical therapist or athletic trainer. Progress the resistance and repetitions only as guided.  You may experience muscle soreness or fatigue, but the pain or discomfort you are trying to eliminate should never worsen during these exercises. If this pain does worsen, stop and make certain you are following the directions exactly. If the pain is still present after adjustments, discontinue the exercise  until you can discuss the trouble with your clinician.  If advised by your physician, during your recovery, avoid activity or exercises which involve actions that place your right / left hand or elbow above your head or behind your back or head. These positions stress the tissues which are trying to heal. STRENGTH - Scapular Depression and Adduction  With good posture, sit on a firm chair. Supported your arms in front of you with pillows, arm rests or a table top. Have your elbows in line with the sides of your body.  Gently draw your shoulder blades down and toward your mid-back spine. Gradually increase the tension without tensing the muscles along the top of your shoulders and the back of your neck.  Hold for __________ seconds. Slowly release the tension and relax your muscles completely before completing the next repetition.  After you have practiced this exercise, remove the arm support and complete it in standing as well as sitting. Repeat __________ times. Complete this exercise __________ times per day.  STRENGTH - External Rotators  Secure a rubber exercise band/tubing to a fixed object so that it is at the same height as your right / left elbow when you are standing or sitting on a firm surface.  Stand or sit so that the secured exercise band/tubing is at your side that is not injured.  Bend your elbow 90 degrees. Place a folded towel or small pillow under your right / left arm so that your elbow is a few inches away from your side.  Keeping the tension on the exercise band/tubing, pull it away from your body, as if pivoting on your elbow. Be sure to keep your body steady so that the movement is only coming from your shoulder rotating.  Hold __________ seconds. Release the tension in a controlled manner as you return to the starting position. Repeat __________ times. Complete this exercise __________ times per day.  STRENGTH - Supraspinatus  Stand or sit with good posture. Grasp  a __________ weight or an exercise band/tubing so that your hand is "thumbs-up," like when you shake hands.  Slowly lift your right / left hand from your thigh into the air, traveling about 30 degrees from straight out at your side. Lift your hand to shoulder height or as far as you can without increasing any shoulder pain. Initially, many people do not lift their hands above shoulder height.  Avoid shrugging your right / left shoulder as your arm rises by keeping your shoulder blade tucked down and toward your mid-back spine.  Hold for __________ seconds. Control the descent of your hand as you slowly return to your starting position. Repeat __________ times. Complete this exercise __________ times per day.  STRENGTH - Shoulder Extensors  Secure a rubber exercise band/tubing so that it is at the height of your shoulders when you are either standing or sitting on a firm arm-less chair.  With a thumbs-up grip, grasp an end of the band/tubing in each hand. Straighten your elbows and lift your hands straight in front of you at shoulder height. Step back away from the secured end of band/tubing until it becomes tense.  Squeezing your shoulder blades together, pull your hands down to the sides of your thighs. Do not allow your hands to go behind you.  Hold for __________ seconds. Slowly ease the tension on the band/tubing as you reverse the directions and return to the starting position. Repeat __________ times. Complete this exercise __________ times per day.  STRENGTH - Scapular Retractors  Secure a rubber exercise band/tubing so that it is at the height of your shoulders when you are either standing or sitting on a firm arm-less chair.  With a palm-down grip, grasp an end of the band/tubing in each hand. Straighten your elbows and lift your hands straight in front of you at shoulder height. Step back away from the secured end of band/tubing until it becomes tense.  Squeezing your shoulder  blades together, draw your elbows back as you bend them. Keep your upper arm lifted away from your body throughout the exercise.  Hold __________ seconds. Slowly ease the tension on the band/tubing as you reverse the directions and return to the starting position. Repeat __________ times. Complete this exercise __________ times per day. STRENGTH - Scapular Depressors  Find a sturdy chair without wheels, such as a from a dining room table.  Keeping your feet on the floor, lift your bottom from the seat and lock your elbows.  Keeping your elbows straight, allow gravity to pull your body weight down. Your shoulders will rise toward your ears.  Raise your body against gravity by drawing your shoulder blades down your back, shortening the distance between your shoulders and ears. Although your feet should always  maintain contact with the floor, your feet should progressively support less body weight as you get stronger.  Hold __________ seconds. In a controlled and slow manner, lower your body weight to begin the next repetition. Repeat ______5____ times. Complete this exercise _____2_____ times per day.    This information is not intended to replace advice given to you by your health care provider. Make sure you discuss any questions you have with your health care provider.   Document Released: 03/26/2005 Document Revised: 06/02/2014 Document Reviewed: 08/24/2008 Elsevier Interactive Patient Education 2016 [employer] have been started on prednisone opposes anti-inflammatory, please do not taking more ibuprofen with this medication.  We've also been given Ultram for pain control as well as a set of exercises that you can start doing for rehabilitation in about one week.  You've also been given a referral to orthopedic surgery.  If your pain is not getting better and that time

## 2015-03-06 NOTE — ED Notes (Signed)
Pt states that she has had ongoing problems with her R shoulder after she fell on it several months ago. Was seen by PCP recently but still has not had relief. Alert and oriented.

## 2015-03-06 NOTE — ED Provider Notes (Signed)
CSN: [id number]     Arrival date & time 03/06/15  0057 History   First MD Initiated Contact with Patient 03/06/15 0108     Chief Complaint  Patient presents with  . Shoulder Pain     (Consider location/radiation/quality/duration/timing/severity/associated sxs/prior Treatment) HPI Comments: Patient states that several weeks ago she had a almost fall in the shower caught herself with her right arm.  She initially had some discomfort in the shoulder that got slightly better with ibuprofen then started becoming more painful again.  She states that she needs to sleep on that side and apply pressure to the area.  This only way she gets any relief.  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  She has discomfort in the anterior portion of the shoulder as well. She has a history of an injury to her shoulder several years ago that was ruled out for rotator cuff injury by orthopedics.  She was put into physical therapy which did help resolve her discomfort.  Patient is a 49 y.o. female presenting with shoulder pain. The history is provided by the patient.  Shoulder Pain Location:  Shoulder Pain details:    Radiates to:  L shoulder, chest and L arm   Severity:  Mild   Onset quality:  Gradual   Timing:  Constant   Progression:  Worsening Handedness:  Right-handed Dislocation: no   Foreign body present:  No foreign bodies Tetanus status:  Out of date Prior injury to area:  Yes Associated symptoms: no fever     Past Medical History  Diagnosis Date  . Hypertension   . Vertigo   . Migraines   . Obesity    Past Surgical History  Procedure Laterality Date  . Hemorrhoid surgery     Family History  Problem Relation Age of Onset  . Diabetes Mother   . Diabetes Maternal Aunt   . Hypertension Father    Social History  Substance Use Topics  . Smoking status: Never Smoker   . Smokeless tobacco: Never Used  . Alcohol Use: Yes   OB History    No data  available     Review of Systems  Constitutional: Negative for fever.  Musculoskeletal: Positive for arthralgias. Negative for joint swelling.  Neurological: Negative for weakness and numbness.  All other systems reviewed and are negative.     Allergies  Review of patient's allergies indicates no known allergies.  Home Medications   Prior to Admission medications   Medication Sig Start Date End Date Taking? Authorizing Provider  albuterol (PROVENTIL HFA;VENTOLIN HFA) 108 (90 BASE) MCG/ACT inhaler Inhale 2 puffs into the lungs every 4 (four) hours as needed for wheezing or shortness of breath. 10/27/14   Marylene Land, NP  guaiFENesin-codeine 100-10 MG/5ML syrup Take 5 mLs by mouth 3 (three) times daily as needed for cough. 10/27/14   Marylene Land, NP  hydrochlorothiazide (HYDRODIURIL) 25 MG tablet Take 1 tablet (25 mg total) by mouth daily. 03/24/13   Lind Covert, MD  predniSONE (DELTASONE) 10 MG tablet Take 2 tablets (20 mg total) by mouth daily with breakfast. 03/06/15   Junius Creamer, NP  traMADol (ULTRAM) 50 MG tablet Take 1 tablet (50 mg total) by mouth every 6 (six) hours as needed. 03/06/15   Junius Creamer, NP   BP 127/83 mmHg  Pulse 64  Temp(Src) 97.8 F (36.6 C) (Oral)  Resp 20  SpO2 100%  LMP 01/10/2015 (Approximate) Physical Exam  Constitutional:  She is oriented to person, place, and time. She appears well-developed and well-nourished.  HENT:  Head: Normocephalic.  Eyes: Pupils are equal, round, and reactive to light.  Neck: No spinous process tenderness and no muscular tenderness present. Decreased range of motion present.  Cardiovascular: Normal rate.   Pulmonary/Chest: Effort normal.  Musculoskeletal: She exhibits tenderness. She exhibits no edema.       Right shoulder: She exhibits decreased range of motion, tenderness and pain. She exhibits no swelling, no effusion, no crepitus, no deformity, no laceration, no spasm, normal pulse and normal strength.        Arms: Neurological: She is alert and oriented to person, place, and time.  Skin: Skin is warm. No rash noted. No erythema.  Nursing note and vitals reviewed.   ED Course  Procedures (including critical care time) Labs Review Labs Reviewed - No data to display  Imaging Review No results found. I have personally reviewed and evaluated these images and lab results as part of my medical decision-making.   EKG Interpretation None     From patient's examination appears that she may have an irritated bursa or in the anterior portion of her right shoulder.  She will be started on steroid and gentle range of motion exercises.  Follow-up with orthopedics needed MDM   Final diagnoses:  Bursitis of right shoulder        Junius Creamer, NP 03/06/15 0143  Veatrice Kells, MD 03/06/15 0147

## 2015-07-11 ENCOUNTER — Emergency Department (HOSPITAL_COMMUNITY): Payer: Self-pay

## 2015-07-11 ENCOUNTER — Encounter (HOSPITAL_COMMUNITY): Payer: Self-pay | Admitting: Emergency Medicine

## 2015-07-11 ENCOUNTER — Emergency Department (HOSPITAL_COMMUNITY)
Admission: EM | Admit: 2015-07-11 | Discharge: 2015-07-11 | Disposition: A | Discharge status: Home / Self Care | Payer: Self-pay | Attending: Emergency Medicine | Admitting: Emergency Medicine

## 2015-07-11 DIAGNOSIS — M109 Gout, unspecified: Secondary | ICD-10-CM | POA: Insufficient documentation

## 2015-07-11 DIAGNOSIS — I10 Essential (primary) hypertension: Secondary | ICD-10-CM | POA: Insufficient documentation

## 2015-07-11 DIAGNOSIS — Z79899 Other long term (current) drug therapy: Secondary | ICD-10-CM | POA: Insufficient documentation

## 2015-07-11 DIAGNOSIS — G43909 Migraine, unspecified, not intractable, without status migrainosus: Secondary | ICD-10-CM | POA: Insufficient documentation

## 2015-07-11 DIAGNOSIS — E669 Obesity, unspecified: Secondary | ICD-10-CM | POA: Insufficient documentation

## 2015-07-11 DIAGNOSIS — Z791 Long term (current) use of non-steroidal anti-inflammatories (NSAID): Secondary | ICD-10-CM | POA: Insufficient documentation

## 2015-07-11 DIAGNOSIS — R1013 Epigastric pain: Secondary | ICD-10-CM | POA: Insufficient documentation

## 2015-07-11 HISTORY — DX: Gout, unspecified: M10.9

## 2015-07-11 LAB — BASIC METABOLIC PANEL
Anion gap: 7 (ref 5–15)
BUN: 18 mg/dL (ref 6–20)
CO2: 26 mmol/L (ref 22–32)
Calcium: 8.9 mg/dL (ref 8.9–10.3)
Chloride: 107 mmol/L (ref 101–111)
Creatinine, Ser: 1.62 mg/dL — ABNORMAL HIGH (ref 0.44–1.00)
GFR calc Af Amer: 42 mL/min — ABNORMAL LOW (ref >60)
GFR calc non Af Amer: 36 mL/min — ABNORMAL LOW (ref >60)
Glucose, Bld: 114 mg/dL — ABNORMAL HIGH (ref 65–99)
Potassium: 4 mmol/L (ref 3.5–5.1)
Sodium: 140 mmol/L (ref 135–145)

## 2015-07-11 LAB — CBC
HCT: 42 % (ref 36.0–46.0)
Hemoglobin: 13.9 g/dL (ref 12.0–15.0)
MCH: 28.3 pg (ref 26.0–34.0)
MCHC: 33.1 g/dL (ref 30.0–36.0)
MCV: 85.4 fL (ref 78.0–100.0)
Platelets: 250 10*3/uL (ref 150–400)
RBC: 4.92 MIL/uL (ref 3.87–5.11)
RDW: 12.9 % (ref 11.5–15.5)
WBC: 8 10*3/uL (ref 4.0–10.5)

## 2015-07-11 LAB — I-STAT TROPONIN, ED: Troponin i, poc: 0 ng/mL (ref 0.00–0.08)

## 2015-07-11 MED ORDER — PANTOPRAZOLE SODIUM 40 MG PO TBEC
40.0000 mg | DELAYED_RELEASE_TABLET | Freq: Every day | ORAL | Status: DC
  Administered 2015-07-11: 40 mg via ORAL
  Filled 2015-07-11: qty 1

## 2015-07-11 MED ORDER — SUCRALFATE 1 GM/10ML PO SUSP: 1.0000 g | Freq: Three times a day (TID) | ORAL | Status: DC

## 2015-07-11 MED ORDER — TRAMADOL HCL 50 MG PO TABS: 50.0000 mg | ORAL_TABLET | Freq: Four times a day (QID) | ORAL | Status: DC | PRN

## 2015-07-11 MED ORDER — PANTOPRAZOLE SODIUM 40 MG PO TBEC: 40.0000 mg | DELAYED_RELEASE_TABLET | Freq: Every day | ORAL | Status: DC

## 2015-07-11 NOTE — ED Provider Notes (Signed)
CSN: [id number]     Arrival date & time 07/11/15  0054 History   First MD Initiated Contact with Patient 07/11/15 [phone]     Chief Complaint  Patient presents with  . Chest Pain  . Back Pain    HPI  Patient presents with one week of epigastric, right upper abdominal pain. Pain is also midline, posterior. There is no new dyspnea, no new syncope, no new asymmetry of strength. No chest pain. Some nausea, some loose stool, but no persistent diarrhea, no persistent vomiting. Patient has history of degenerative disease, had medication injection one week ago, about the time of onset of symptoms. Patient does not smoke, is not sedentary. No asymmetry of the lower extremities. No relief with Gas-X.    Past Medical History  Diagnosis Date  . Hypertension   . Vertigo   . Migraines   . Obesity   . Gout    Past Surgical History  Procedure Laterality Date  . Hemorrhoid surgery     Family History  Problem Relation Age of Onset  . Diabetes Mother   . Diabetes Maternal Aunt   . Hypertension Father    Social History  Substance Use Topics  . Smoking status: Never Smoker   . Smokeless tobacco: Never Used  . Alcohol Use: Yes   OB History    No data available     Review of Systems  Constitutional:       Per HPI, otherwise negative  HENT:       Per HPI, otherwise negative  Respiratory:       Per HPI, otherwise negative  Cardiovascular:       Per HPI, otherwise negative  Gastrointestinal: Negative for vomiting.  Endocrine:       Negative aside from HPI  Genitourinary:       Neg aside from HPI   Musculoskeletal:       Per HPI, otherwise negative  Skin: Negative.   Neurological: Negative for syncope.      Allergies  Review of patient's allergies indicates no known allergies.  Home Medications   Prior to Admission medications   Medication Sig Start Date End Date Taking? Authorizing Provider  calcium carbonate (TUMS - DOSED IN MG ELEMENTAL CALCIUM) 500 MG chewable  tablet Chew 1 tablet by mouth as needed for indigestion or heartburn.   Yes Historical Provider, MD  hydrochlorothiazide (HYDRODIURIL) 25 MG tablet Take 1 tablet (25 mg total) by mouth daily. 03/24/13  Yes Lind Covert, MD  indomethacin (INDOCIN) 25 MG capsule Take 25 mg by mouth 3 (three) times daily.   Yes Historical Provider, MD  simethicone (GAS-X) 80 MG chewable tablet Chew 160 mg by mouth every 6 (six) hours as needed for flatulence.   Yes Historical Provider, MD  topiramate (TOPAMAX) 25 MG tablet Take 25 mg by mouth daily.   Yes Historical Provider, MD  albuterol (PROVENTIL HFA;VENTOLIN HFA) 108 (90 BASE) MCG/ACT inhaler Inhale 2 puffs into the lungs every 4 (four) hours as needed for wheezing or shortness of breath. Patient not taking: Reported on 07/11/2015 10/27/14   Marylene Land, NP  pantoprazole (PROTONIX) 40 MG tablet Take 1 tablet (40 mg total) by mouth daily. 07/11/15   Carmin Muskrat, MD  predniSONE (DELTASONE) 10 MG tablet Take 2 tablets (20 mg total) by mouth daily with breakfast. Patient not taking: Reported on 07/11/2015 03/06/15   Junius Creamer, NP  sucralfate (CARAFATE) 1 GM/10ML suspension Take 10 mLs (1 g total) by mouth 4 (four) times  daily -  with meals and at bedtime. 07/11/15 07/18/15  Carmin Muskrat, MD  traMADol (ULTRAM) 50 MG tablet Take 1 tablet (50 mg total) by mouth every 6 (six) hours as needed. 07/11/15   Carmin Muskrat, MD   BP 161/100 mmHg  Pulse 62  Temp(Src) 97.7 F (36.5 C) (Oral)  Resp 16  Ht 5\' 9"  (1.753 m)  Wt 297 lb (134.718 kg)  BMI 43.84 kg/m2  SpO2 100%  LMP 07/03/2015 (Approximate) Physical Exam  Constitutional: She is oriented to person, place, and time. She appears well-developed and well-nourished. No distress.  Obese female sitting upright in bed, awake, alert, hemodynamically stable  HENT:  Head: Normocephalic and atraumatic.  Eyes: Conjunctivae and EOM are normal.  Cardiovascular: Normal rate and regular rhythm.     Pulmonary/Chest: Effort normal and breath sounds normal. No stridor. No respiratory distress.  Abdominal: She exhibits no distension. There is tenderness in the epigastric area.    Musculoskeletal: She exhibits no edema.  Neurological: She is alert and oriented to person, place, and time. No cranial nerve deficit.  Skin: Skin is warm and dry.  Psychiatric: She has a normal mood and affect.  Nursing note and vitals reviewed.   ED Course  Procedures (including critical care time) Labs Review Labs Reviewed  BASIC METABOLIC PANEL - Abnormal; Notable for the following:    Glucose, Bld 114 (*)    Creatinine, Ser 1.62 (*)    GFR calc non Af Amer 36 (*)    GFR calc Af Amer 42 (*)    All other components within normal limits  CBC  I-STAT TROPOININ, ED   Labs reviewed, and creatinine is similar over the past 6 years. We reviewed labs, EKG together, discussed possibilities.    Imaging Review Dg Chest 2 View  07/11/2015  CLINICAL DATA:  Chest pain and pressure for the past week, radiating to the left side and back. EXAM: CHEST  2 VIEW COMPARISON:  10/27/2014 FINDINGS: Normal heart size and pulmonary vascularity. No focal airspace disease or consolidation in the lungs. No blunting of costophrenic angles. No pneumothorax. Mediastinal contours appear intact. Degenerative changes in the spine. IMPRESSION: No active cardiopulmonary disease. Electronically Signed   By: Lucienne Capers M.D.   On: 07/11/2015 01:59   I have personally reviewed and evaluated these images and lab results as part of my medical decision-making.   EKG Interpretation   Date/Time:  Wednesday July 11 2015 01:00:03 EST Ventricular Rate:  72 PR Interval:  140 QRS Duration: 91 QT Interval:  431 QTC Calculation: 472 R Axis:   31 Text Interpretation:  Sinus rhythm Low voltage, precordial leads Abnormal  R-wave progression, early transition Borderline T abnormalities, anterior  leads Baseline wander in lead(s) II  Sinus rhythm Low voltage QRS T wave  abnormality No significant change since last tracing Abnormal ekg  Confirmed by Carmin Muskrat  MD ([id number]) on 07/11/2015 7:39:24 AM      MDM   Final diagnoses:  Epigastric pain    This obese, 50yo F p/w one week of epigastric / R upper abdomen and lower chest discomfort.  Eval here reassuring w no e/o ACS, PE, PNA, acute abdominal process.  There is some suspicion for gastro-esophageal etiology given her description of Sx. With passage of almost five days, the lack of positive troponin, and non-ischemic ECG are reassuring. Patient started on PPI for one week, will f/u w PMD.   Carmin Muskrat, MD 07/11/15 [phone]

## 2015-07-11 NOTE — ED Notes (Signed)
Pt states that since last week she has had upper back pain that progressed into chest pressure that radiates around the R side to the back. Tried gas x and tums at home w/o relief. Alert and oriented.

## 2015-07-11 NOTE — Discharge Instructions (Signed)
As discussed, today's evaluation is largely reassuring.  Your discomfort is likely due to irritation of the stomach lining, and esophagus.  Please take all medication as directed, and be sure to follow-up with your physician within one week for repeat evaluation.  Return here for concerning changes in your condition.

## 2015-07-11 NOTE — ED Notes (Signed)
MD at bedside. 

## 2015-07-13 ENCOUNTER — Telehealth (HOSPITAL_COMMUNITY): Payer: Self-pay

## 2015-07-13 NOTE — Telephone Encounter (Signed)
Pt calling she has lost Rx for Carafate rcvd on 07/11/2015 from Dr Vanita Panda and would like to know if it could be called in to pharmacy for her.  Dr Vanita Panda consulted ok to call in for pt.  Rx called to [facility] [phone] and given to the [facility]

## 2015-10-25 ENCOUNTER — Encounter (HOSPITAL_COMMUNITY): Payer: Self-pay | Admitting: Emergency Medicine

## 2015-10-25 ENCOUNTER — Emergency Department (HOSPITAL_COMMUNITY)
Admission: EM | Admit: 2015-10-25 | Discharge: 2015-10-26 | Disposition: A | Discharge status: Home / Self Care | Payer: Self-pay | Attending: Emergency Medicine | Admitting: Emergency Medicine

## 2015-10-25 DIAGNOSIS — M778 Other enthesopathies, not elsewhere classified: Secondary | ICD-10-CM | POA: Insufficient documentation

## 2015-10-25 DIAGNOSIS — Z79899 Other long term (current) drug therapy: Secondary | ICD-10-CM | POA: Insufficient documentation

## 2015-10-25 DIAGNOSIS — I10 Essential (primary) hypertension: Secondary | ICD-10-CM | POA: Insufficient documentation

## 2015-10-25 DIAGNOSIS — E669 Obesity, unspecified: Secondary | ICD-10-CM | POA: Insufficient documentation

## 2015-10-25 DIAGNOSIS — M779 Enthesopathy, unspecified: Secondary | ICD-10-CM

## 2015-10-25 NOTE — ED Notes (Signed)
Patient presents for posterior neck pain x4 days. Denies known injury, fever, cough, numbness/tingling, loss of bladder or bowel. Reports painful swallowing. Rates pain 10/10. No relief with OTC medications.

## 2015-10-26 ENCOUNTER — Encounter (HOSPITAL_COMMUNITY): Payer: Self-pay

## 2015-10-26 ENCOUNTER — Emergency Department (HOSPITAL_COMMUNITY): Payer: Self-pay

## 2015-10-26 LAB — I-STAT CREATININE, ED: Creatinine, Ser: 1.7 mg/dL — ABNORMAL HIGH (ref 0.44–1.00)

## 2015-10-26 MED ORDER — OXYCODONE-ACETAMINOPHEN 5-325 MG PO TABS
2.0000 | ORAL_TABLET | Freq: Once | ORAL | Status: AC
  Administered 2015-10-26: 2 via ORAL
  Filled 2015-10-26: qty 2

## 2015-10-26 MED ORDER — METHOCARBAMOL 500 MG PO TABS: 500.0000 mg | ORAL_TABLET | Freq: Two times a day (BID) | ORAL | Status: DC | PRN

## 2015-10-26 MED ORDER — IOPAMIDOL (ISOVUE-300) INJECTION 61%
75.0000 mL | Freq: Once | INTRAVENOUS | Status: AC | PRN
  Administered 2015-10-26: 60 mL via INTRAVENOUS

## 2015-10-26 MED ORDER — SODIUM CHLORIDE 0.9 % IV BOLUS (SEPSIS)
1000.0000 mL | Freq: Once | INTRAVENOUS | Status: AC
  Administered 2015-10-26: 1000 mL via INTRAVENOUS

## 2015-10-26 MED ORDER — OXYCODONE-ACETAMINOPHEN 5-325 MG PO TABS: 1.0000 | ORAL_TABLET | Freq: Four times a day (QID) | ORAL | Status: DC | PRN

## 2015-10-26 NOTE — Discharge Instructions (Signed)
Your CT today shows that you have calcific tendinitis of your longus colli muscle in your neck. This is causing you to have some discomfort with swallowing because of the degree of inflammation. We recommend that you follow-up with ENT to ensure resolution of symptoms. Go to [facility] if your symptoms worsen. Take Percocet as prescribed for pain. You may take Robaxin as needed for muscle spasms. You may also try applying ice and heat 3-4 times per day.  Calcific Tendinitis Calcific tendinitis occurs when crystals of calcium are deposited in a tendon. Tendons are bands of strong, fibrous tissue that attach muscles to bones. Tendons are an important part of joints. They make the joint move and they absorb some of the stress that a joint receives during use. When calcium is deposited in the tendon, the tendon becomes stiff, painful, and it can become swollen. Calcific tendinitis occurs frequently in the shoulder joint, in a structure called the rotator cuff. CAUSES  The cause of calcific tendinitis is unclear. It may be associated with:  Overuse of the tendon, such as from repetitive motion.  Excess stress on the tendon.  Aging.  Repetitive, mild injuries. SYMPTOMS   Pain may or may not be present. If it is present, it may occur when moving the joint.  Tenderness when pressure is applied to the tendon.  A snapping or popping sound when the joint moves.  Decreased motion of the joint.  Difficulty sleeping due to pain in the joint. DIAGNOSIS  Your health care provider will perform a physical exam. Imaging tests may also be used to make the diagnosis. These may include X-rays, an MRI, or a CT scan. TREATMENT  Generally, calcific tendinitis resolves on its own. Treatment for pain of calcific tendinitis may include:  Taking over-the-counter medicines, such as anti-inflammatory drugs.  Applying ice packs to the joint.  Following a specific exercise program to keep the joint working  properly.  Attending physical therapy sessions.  Avoiding activities that cause pain. Treatment for more severe calcific tendinitis may require:  Injecting cortisone steroids or pain relieving medicines into or around the joint.  Manipulating the joint after you are given medicine to numb the area (local anesthetic).  Inflating the joint with sterile fluid to increase the flexibility of the tendons.  Shock wave therapy, which involves focusing sound waves on the joint. If other treatments do not work, surgery may be done to clean out the calcium deposits and repair the tendons where needed. Most people do not need surgery. HOME CARE INSTRUCTIONS   Only take over-the-counter or prescription medicines for pain, fever, or discomfort as directed by your health care provider.  Follow your health care provider's recommendations for activity and exercise. SEEK MEDICAL CARE IF:  You notice an increase in pain or numbness.  You develop new weakness.  You notice increased joint stiffness or a sensation of looseness in the joint.  You notice increasing redness, swelling, or warmth around the joint area. SEEK IMMEDIATE MEDICAL CARE IF:  You have a fever or persistent symptoms for more than 2 to 3 days.  You have a fever and your symptoms suddenly get worse. MAKE SURE YOU:  Understand these instructions.  Will watch your condition.  Will get help right away if you are not doing well or get worse.   This information is not intended to replace advice given to you by your health care provider. Make sure you discuss any questions you have with your health care provider.  Document Released: 02/19/2008 Document Revised: 01/31/2015 Document Reviewed: 08/21/2011 Elsevier Interactive Patient Education [employer].

## 2015-10-26 NOTE — ED Provider Notes (Signed)
CSN: [id number]     Arrival date & time 10/25/15  2247 History   First MD Initiated Contact with Patient 10/26/15 0101     No chief complaint on file.    (Consider location/radiation/quality/duration/timing/severity/associated sxs/prior Treatment) HPI Comments: 50 year old female with a history of hypertension and obesity presents to the emergency department for evaluation of 4 days of posterior neck pain. Patient states that she has been trying over-the-counter anti-inflammatories and Tylenol without relief. She has also applied heat and ice with little effect. Pain is aggravated with neck extension. She denies any change in neck flexion. She does state that her symptoms have worsened to include dysphasia. She is also complaining of some anterior neck pain. She denies any trauma or injury to her neck. No recent fevers, cough, extremity numbness or weakness. She denies heavy lifting at her job. She denies a history of similar symptoms.  The history is provided by the patient. No language interpreter was used.    Past Medical History  Diagnosis Date  . Hypertension   . Vertigo   . Migraines   . Obesity   . Gout    Past Surgical History  Procedure Laterality Date  . Hemorrhoid surgery     Family History  Problem Relation Age of Onset  . Diabetes Mother   . Diabetes Maternal Aunt   . Hypertension Father    Social History  Substance Use Topics  . Smoking status: Never Smoker   . Smokeless tobacco: Never Used  . Alcohol Use: Yes   OB History    No data available      Review of Systems  Constitutional: Negative for fever.  HENT: Positive for trouble swallowing. Negative for drooling.   Respiratory: Negative for shortness of breath.   Musculoskeletal: Positive for neck pain.  Neurological: Negative for weakness.  All other systems reviewed and are negative.   Allergies  Review of patient's allergies indicates no known allergies.  Home Medications   Prior to Admission  medications   Medication Sig Start Date End Date Taking? Authorizing Provider  acetaminophen (TYLENOL) 500 MG tablet Take 1,000 mg by mouth every 6 (six) hours as needed for mild pain, moderate pain or headache.   Yes Historical Provider, MD  hydrochlorothiazide (HYDRODIURIL) 25 MG tablet Take 1 tablet (25 mg total) by mouth daily. 03/24/13  Yes Lind Covert, MD  albuterol (PROVENTIL HFA;VENTOLIN HFA) 108 (90 BASE) MCG/ACT inhaler Inhale 2 puffs into the lungs every 4 (four) hours as needed for wheezing or shortness of breath. Patient not taking: Reported on 07/11/2015 10/27/14   Marylene Land, NP  pantoprazole (PROTONIX) 40 MG tablet Take 1 tablet (40 mg total) by mouth daily. Patient not taking: Reported on 10/25/2015 07/11/15   Carmin Muskrat, MD  sucralfate (CARAFATE) 1 GM/10ML suspension Take 10 mLs (1 g total) by mouth 4 (four) times daily -  with meals and at bedtime. 07/11/15 07/18/15  Carmin Muskrat, MD  traMADol (ULTRAM) 50 MG tablet Take 1 tablet (50 mg total) by mouth every 6 (six) hours as needed. Patient not taking: Reported on 10/25/2015 07/11/15   Carmin Muskrat, MD   BP 157/94 mmHg  Pulse 50  Temp(Src) 97.8 F (36.6 C)  Resp 18  SpO2 100%  LMP 10/24/2015   Physical Exam  Constitutional: She is oriented to person, place, and time. She appears well-developed and well-nourished. No distress.  Nontoxic-appearing  HENT:  Head: Normocephalic and atraumatic.  Oropharynx clear. Patient tolerating secretions without difficulty. Uvula midline.  Eyes: Conjunctivae and EOM are normal. No scleral icterus.  Neck: Normal range of motion.    Neck is supple. No masses palpated. No stridor. No nuchal rigidity or meningismus. No tenderness to palpation to the cervical midline; no bony deformities, step-offs, or crepitus.  Cardiovascular: Normal rate, regular rhythm and intact distal pulses.   Distal radial pulse 2+ b/l  Pulmonary/Chest: Effort normal. No respiratory distress. She has  no wheezes.  Respirations even and unlabored  Musculoskeletal: Normal range of motion.  Neurological: She is alert and oriented to person, place, and time. She exhibits normal muscle tone. Coordination normal.  Sensation to light touch intact. Patient moving all extremities.  Skin: Skin is warm and dry. No rash noted. She is not diaphoretic. No erythema. No pallor.  Psychiatric: She has a normal mood and affect. Her behavior is normal.  Nursing note and vitals reviewed.   ED Course  Procedures (including critical care time) Labs Review Labs Reviewed  I-STAT CREATININE, ED - Abnormal; Notable for the following:    Creatinine, Ser 1.70 (*)    All other components within normal limits    Imaging Review Ct Soft Tissue Neck W Contrast  10/26/2015  CLINICAL DATA:  Initial valuation for acute neck pain for 4 days. EXAM: CT NECK WITH CONTRAST TECHNIQUE: Multidetector CT imaging of the neck was performed using the standard protocol following the bolus administration of intravenous contrast. CONTRAST:  36mL ISOVUE-300 IOPAMIDOL (ISOVUE-300) INJECTION 61% COMPARISON:  Prior radiographs from 02/04/2005. FINDINGS: Visualized portions of the brain are unremarkable. Globes and orbits within normal limits. Visualized paranasal sinuses are clear. No mastoid effusion. Middle ear cavities are clear. Salivary glands including the parotid glands and submandibular glands are normal. Oral cavity within normal limits. No acute abnormality about the dentition. Palatine tonsils symmetric and within normal limits. Parapharyngeal fat preserved. Nasopharynx within normal limits. Prominent hypodense retropharyngeal collection present, extending from the C1-2 level inferiorly to C6. Collection measures approximately 11 x 25 x 61 mm (AP by transverse by a craniocaudad). There is smudgy amorphous calcification inferior to the anterior ring of C1. Finding favored to reflect acute longus coli tendonitis. Possible infection could  be considered in the correct clinical setting, although this is less favored. Epiglottis itself is normal. Lingual tonsils filled the vallecula. Mild mass effect on the supraglottic airway a related to the retropharyngeal effusion, with no definite additional significant inflammatory changes within the pharynx itself. Airway narrowed to 9 mm in AP diameter at its most narrow point. True cords are symmetric and within normal limits. Subglottic airway is clear. Thyroid normal. No significant adenopathy within neck. Visualized superior mediastinum grossly unremarkable, although evaluation somewhat limited by body habitus. Visualized lungs are clear. Normal intravascular enhancement seen throughout the neck. No other acute osseus abnormality. No worrisome lytic or blastic osseous lesions. Sclerotic changes within the visualized bones of the cervical spine favored to related underlying digit changes. No findings to suggest osteomyelitis discitis. Moderate degenerative spondylolysis at C5-6 and C6-7. IMPRESSION: Prominent retropharyngeal effusion with smudgy amorphous calcification inferior to the anterior ring of C1. Findings favored to reflect acute longus coli tendonitis. Possible infection not entirely excluded, and could be considered in the correct clinical setting, although this is less favored. There is secondary mild mass effect on the supraglottic airway which remains patent at this time. Electronically Signed   By: Jeannine Boga M.D.   On: 10/26/2015 04:29     I have personally reviewed and evaluated these images and lab results as  part of my medical decision-making.   EKG Interpretation None      MDM   Final diagnoses:  Tendonitis    50 year old female presents to the emergency department for evaluation of 4 days of posterior neck pain with associated dysphagia. Patient tolerating her secretions without difficulty. No complaints of shortness of breath. No hypoxia, stridor, or tripoding.  Oropharynx clear on exam.  CT obtained which shows findings consistent with acute longus coli tendinitis. This is consistent with the patient's symptoms today. Doubt infectious etiology. Patient is afebrile. She has no meningeal signs.  Pain controlled in the ED with Percocet. Unable to give NSAIDs secondary to kidney function. Will discharge with Percocet and Robaxin. Patient has been advised to follow-up with ENT and to return to [facility] if symptoms worsen. Return precautions discussed and provided. Patient discharged in satisfactory condition with no unaddressed concerns.   Filed Vitals:   10/25/15 2256 10/26/15 0205 10/26/15 0500 10/26/15 0514  BP: 168/124 157/94 151/91   Pulse: 51 50 49   Temp: 97.8 F (36.6 C)   97.3 F (36.3 C)  TempSrc:    Oral  Resp: 18   17  SpO2: 100% 100% 97%      Antonietta Breach, PA-C 10/26/15 [id number]  Jola Schmidt, MD 10/26/15 [phone]

## 2015-10-26 NOTE — ED Notes (Signed)
RN will be starting a line and drawing blood work.

## 2016-05-16 ENCOUNTER — Emergency Department (HOSPITAL_COMMUNITY)
Admission: EM | Admit: 2016-05-16 | Discharge: 2016-05-16 | Disposition: A | Discharge status: Home / Self Care | Payer: Self-pay | Attending: Emergency Medicine | Admitting: Emergency Medicine

## 2016-05-16 ENCOUNTER — Encounter (HOSPITAL_COMMUNITY): Payer: Self-pay | Admitting: Emergency Medicine

## 2016-05-16 ENCOUNTER — Ambulatory Visit (HOSPITAL_COMMUNITY)
Admission: EM | Admit: 2016-05-16 | Discharge: 2016-05-16 | Disposition: A | Discharge status: Nursing Home (Includes ICF) | Payer: Self-pay | Attending: Family Medicine | Admitting: Family Medicine

## 2016-05-16 ENCOUNTER — Emergency Department (HOSPITAL_COMMUNITY): Payer: Self-pay

## 2016-05-16 ENCOUNTER — Encounter (HOSPITAL_COMMUNITY): Payer: Self-pay | Admitting: *Deleted

## 2016-05-16 DIAGNOSIS — K810 Acute cholecystitis: Secondary | ICD-10-CM

## 2016-05-16 DIAGNOSIS — R1011 Right upper quadrant pain: Secondary | ICD-10-CM

## 2016-05-16 DIAGNOSIS — R1013 Epigastric pain: Secondary | ICD-10-CM | POA: Insufficient documentation

## 2016-05-16 DIAGNOSIS — Z79899 Other long term (current) drug therapy: Secondary | ICD-10-CM | POA: Insufficient documentation

## 2016-05-16 DIAGNOSIS — N183 Chronic kidney disease, stage 3 (moderate): Secondary | ICD-10-CM | POA: Insufficient documentation

## 2016-05-16 DIAGNOSIS — I129 Hypertensive chronic kidney disease with stage 1 through stage 4 chronic kidney disease, or unspecified chronic kidney disease: Secondary | ICD-10-CM | POA: Insufficient documentation

## 2016-05-16 LAB — CBC
HCT: 40.5 % (ref 36.0–46.0)
Hemoglobin: 13.6 g/dL (ref 12.0–15.0)
MCH: 27.8 pg (ref 26.0–34.0)
MCHC: 33.6 g/dL (ref 30.0–36.0)
MCV: 82.8 fL (ref 78.0–100.0)
Platelets: 252 10*3/uL (ref 150–400)
RBC: 4.89 MIL/uL (ref 3.87–5.11)
RDW: 13.1 % (ref 11.5–15.5)
WBC: 7.1 10*3/uL (ref 4.0–10.5)

## 2016-05-16 LAB — COMPREHENSIVE METABOLIC PANEL
ALT: 31 U/L (ref 14–54)
AST: 23 U/L (ref 15–41)
Albumin: 3.8 g/dL (ref 3.5–5.0)
Alkaline Phosphatase: 70 U/L (ref 38–126)
Anion gap: 8 (ref 5–15)
BUN: 11 mg/dL (ref 6–20)
CO2: 26 mmol/L (ref 22–32)
Calcium: 9.2 mg/dL (ref 8.9–10.3)
Chloride: 104 mmol/L (ref 101–111)
Creatinine, Ser: 1.75 mg/dL — ABNORMAL HIGH (ref 0.44–1.00)
GFR calc Af Amer: 38 mL/min — ABNORMAL LOW (ref >60)
GFR calc non Af Amer: 33 mL/min — ABNORMAL LOW (ref >60)
Glucose, Bld: 99 mg/dL (ref 65–99)
Potassium: 3.8 mmol/L (ref 3.5–5.1)
Sodium: 138 mmol/L (ref 135–145)
Total Bilirubin: 0.7 mg/dL (ref 0.3–1.2)
Total Protein: 7.2 g/dL (ref 6.5–8.1)

## 2016-05-16 LAB — URINALYSIS, ROUTINE W REFLEX MICROSCOPIC
Bacteria, UA: NONE SEEN
Bilirubin Urine: NEGATIVE
Glucose, UA: NEGATIVE mg/dL
Ketones, ur: NEGATIVE mg/dL
Leukocytes, UA: NEGATIVE
Nitrite: NEGATIVE
Protein, ur: 30 mg/dL — AB
Specific Gravity, Urine: 1.015 (ref 1.005–1.030)
pH: 6 (ref 5.0–8.0)

## 2016-05-16 LAB — LIPASE, BLOOD: Lipase: 39 U/L (ref 11–51)

## 2016-05-16 MED ORDER — HYDROMORPHONE HCL 1 MG/ML IJ SOLN
2.0000 mg | Freq: Once | INTRAMUSCULAR | Status: AC
  Administered 2016-05-16: 2 mg via INTRAMUSCULAR

## 2016-05-16 MED ORDER — HYDROMORPHONE HCL 2 MG/ML IJ SOLN
1.0000 mg | Freq: Once | INTRAMUSCULAR | Status: AC
  Administered 2016-05-16: 1 mg via INTRAVENOUS
  Filled 2016-05-16: qty 1

## 2016-05-16 MED ORDER — ONDANSETRON 4 MG PO TBDP
4.0000 mg | ORAL_TABLET | Freq: Once | ORAL | Status: AC
  Administered 2016-05-16: 4 mg via ORAL

## 2016-05-16 MED ORDER — ONDANSETRON HCL 4 MG/2ML IJ SOLN
4.0000 mg | Freq: Once | INTRAMUSCULAR | Status: AC
  Administered 2016-05-16: 4 mg via INTRAVENOUS
  Filled 2016-05-16: qty 2

## 2016-05-16 MED ORDER — PANTOPRAZOLE SODIUM 40 MG PO TBEC: 40.0000 mg | DELAYED_RELEASE_TABLET | Freq: Every day | ORAL | 0 refills | Status: DC

## 2016-05-16 MED ORDER — SODIUM CHLORIDE 0.9 % IV BOLUS (SEPSIS)
1000.0000 mL | Freq: Once | INTRAVENOUS | Status: AC
  Administered 2016-05-16: 1000 mL via INTRAVENOUS

## 2016-05-16 MED ORDER — HYDROMORPHONE HCL 1 MG/ML IJ SOLN
INTRAMUSCULAR | Status: AC
  Filled 2016-05-16: qty 2

## 2016-05-16 MED ORDER — ONDANSETRON 4 MG PO TBDP
ORAL_TABLET | ORAL | Status: AC
  Filled 2016-05-16: qty 1

## 2016-05-16 NOTE — ED Provider Notes (Signed)
[place]    CSN: [id number] Arrival date & time: 05/16/16  1010     History   Chief Complaint Chief Complaint  Patient presents with  . Abdominal Pain    HPI Heidi Rivera is a 50 y.o. female.   The history is provided by the patient.  Abdominal Pain  Pain location:  RUQ Pain quality: cramping   Pain radiates to:  R flank Pain severity:  Moderate (9/10 pain level.) Progression:  Worsening Chronicity:  New (onset mild sev wks ago but worse yest with vomiting.) Relieved by:  None tried Associated symptoms: nausea and vomiting   Associated symptoms: no constipation, no diarrhea and no fever   Risk factors: obesity     Past Medical History:  Diagnosis Date  . Gout   . Hypertension   . Migraines   . Obesity   . Vertigo     Patient Active Problem List   Diagnosis Date Noted  . Rotator cuff injury 06/04/2011  . MIGRAINE WITH AURA 07/11/2008  . CHRONIC KIDNEY DISEASE STAGE III (MODERATE) 07/11/2008  . ESSENTIAL HYPERTENSION, BENIGN 08/30/2007  . OBESITY, NOS 07/23/2006    Past Surgical History:  Procedure Laterality Date  . HEMORRHOID SURGERY      OB History    No data available       Home Medications    Prior to Admission medications   Medication Sig Start Date End Date Taking? Authorizing Provider  acetaminophen (TYLENOL) 500 MG tablet Take 1,000 mg by mouth every 6 (six) hours as needed for mild pain, moderate pain or headache.    Historical Provider, MD  albuterol (PROVENTIL HFA;VENTOLIN HFA) 108 (90 BASE) MCG/ACT inhaler Inhale 2 puffs into the lungs every 4 (four) hours as needed for wheezing or shortness of breath. Patient not taking: Reported on 07/11/2015 10/27/14   Marylene Land, NP  hydrochlorothiazide (HYDRODIURIL) 25 MG tablet Take 1 tablet (25 mg total) by mouth daily. 03/24/13   Lind Covert, MD  methocarbamol (ROBAXIN) 500 MG tablet Take 1 tablet (500 mg total) by mouth 2 (two) times daily as needed for muscle  spasms. 10/26/15   Antonietta Breach, PA-C  oxyCODONE-acetaminophen (PERCOCET/ROXICET) 5-325 MG tablet Take 1-2 tablets by mouth every 6 (six) hours as needed for moderate pain or severe pain. 10/26/15   Antonietta Breach, PA-C  pantoprazole (PROTONIX) 40 MG tablet Take 1 tablet (40 mg total) by mouth daily. Patient not taking: Reported on 10/25/2015 07/11/15   Carmin Muskrat, MD  sucralfate (CARAFATE) 1 GM/10ML suspension Take 10 mLs (1 g total) by mouth 4 (four) times daily -  with meals and at bedtime. 07/11/15 07/18/15  Carmin Muskrat, MD  traMADol (ULTRAM) 50 MG tablet Take 1 tablet (50 mg total) by mouth every 6 (six) hours as needed. Patient not taking: Reported on 10/25/2015 07/11/15   Carmin Muskrat, MD    Family History Family History  Problem Relation Age of Onset  . Diabetes Mother   . Diabetes Maternal Aunt   . Hypertension Father     Social History Social History  Substance Use Topics  . Smoking status: Never Smoker  . Smokeless tobacco: Never Used  . Alcohol use Yes     Allergies   Patient has no known allergies.   Review of Systems Review of Systems  Constitutional: Positive for appetite change. Negative for fever.  HENT: Negative.   Respiratory: Negative.   Cardiovascular: Negative.   Gastrointestinal: Positive for abdominal pain, nausea and vomiting. Negative for  blood in stool, constipation and diarrhea.  Genitourinary: Negative.      Physical Exam Triage Vital Signs ED Triage Vitals  Enc Vitals Group     BP 05/16/16 1049 158/99     Pulse Rate 05/16/16 1049 (!) 55     Resp 05/16/16 1049 18     Temp 05/16/16 1049 98.6 F (37 C)     Temp Source 05/16/16 1049 Oral     SpO2 05/16/16 1049 99 %     Weight --      Height --      Head Circumference --      Peak Flow --      Pain Score 05/16/16 1051 9     Pain Loc --      Pain Edu? --      Excl. in [place]? --    No data found.   Updated Vital Signs BP 158/99 (BP Location: Left Arm)   Pulse (!) 55   Temp 98.6 F (37  C) (Oral)   Resp 18   LMP 05/14/2016   SpO2 99%   Visual Acuity Right Eye Distance:   Left Eye Distance:   Bilateral Distance:    Right Eye Near:   Left Eye Near:    Bilateral Near:     Physical Exam  Constitutional: She appears well-developed and well-nourished. She appears distressed.  HENT:  Mouth/Throat: Oropharynx is clear and moist.  Neck: Normal range of motion. Neck supple.  Abdominal: Soft. Normal appearance. Bowel sounds are decreased. There is tenderness in the right upper quadrant. There is positive Murphy's sign.  Nursing note and vitals reviewed.    UC Treatments / Results  Labs (all labs ordered are listed, but only abnormal results are displayed) Labs Reviewed - No data to display  EKG  EKG Interpretation None       Radiology No results found.  Procedures Procedures (including critical care time)  Medications Ordered in UC Medications  ondansetron (ZOFRAN-ODT) disintegrating tablet 4 mg (not administered)  HYDROmorphone (DILAUDID) injection 2 mg (not administered)     Initial Impression / Assessment and Plan / UC Course  I have reviewed the triage vital signs and the nursing notes.  Pertinent labs & imaging results that were available during my care of the patient were reviewed by me and considered in my medical decision making (see chart for details).  Clinical Course     Sent for likely GB eval of ruq pain.  Final Clinical Impressions(s) / UC Diagnoses   Final diagnoses:  Acute cholecystitis    New Prescriptions New Prescriptions   No medications on file     Billy Fischer, MD 05/16/16 1135

## 2016-05-16 NOTE — ED Triage Notes (Signed)
Pt sent here from UC, pt c/o RUQ abdominal pain, and n/v. Pt also c/o feeling bloated. Pt sent here to get gallbadder checked. Pt given dilaudid shot and zofran shot at UC. Pt denies pain.

## 2016-05-16 NOTE — ED Notes (Signed)
ED Provider at bedside. 

## 2016-05-16 NOTE — ED Notes (Signed)
Pt   Advised  Npo      report  Phoned  To triage  nurse

## 2016-05-16 NOTE — ED Notes (Signed)
Pt reports having a "stuffed feeling" and "swooshing feeling" on her RUQ for the "past few weeks" with radiation to the back.

## 2016-05-16 NOTE — ED Provider Notes (Signed)
[facility] DEPT Provider Note   CSN: [id number] Arrival date & time: 05/16/16  1216     History   Chief Complaint Chief Complaint  Patient presents with  . Abdominal Pain    HPI Heidi Rivera is a 50 y.o. female.  The history is provided by the patient and medical records.  Abdominal Pain   This is a new problem. The current episode started yesterday (had some milder symptoms over 3 weeks). The problem occurs constantly. The problem has been gradually worsening. The pain is associated with eating. The pain is located in the RUQ. The quality of the pain is sharp and dull. The pain is moderate. Associated symptoms include flatus, nausea, vomiting and constipation. Pertinent negatives include fever, diarrhea, hematochezia, melena, dysuria, frequency, hematuria and headaches. The symptoms are aggravated by palpation and activity. Relieved by: dilaudid at UC.    Past Medical History:  Diagnosis Date  . Gout   . Hypertension   . Migraines   . Obesity   . Vertigo     Patient Active Problem List   Diagnosis Date Noted  . Rotator cuff injury 06/04/2011  . MIGRAINE WITH AURA 07/11/2008  . CHRONIC KIDNEY DISEASE STAGE III (MODERATE) 07/11/2008  . ESSENTIAL HYPERTENSION, BENIGN 08/30/2007  . OBESITY, NOS 07/23/2006    Past Surgical History:  Procedure Laterality Date  . HEMORRHOID SURGERY      OB History    No data available       Home Medications    Prior to Admission medications   Medication Sig Start Date End Date Taking? Authorizing Provider  hydrochlorothiazide (HYDRODIURIL) 25 MG tablet Take 1 tablet (25 mg total) by mouth daily. 03/24/13  Yes Lind Covert, MD  HYDROmorphone HCl (DILAUDID IJ) Inject 2 mg as directed once.   Yes Historical Provider, MD  ondansetron (ZOFRAN-ODT) 4 MG disintegrating tablet Take 4 mg by mouth once.   Yes Historical Provider, MD  acetaminophen (TYLENOL) 500 MG tablet Take 1,000 mg by mouth every 6 (six) hours as  needed for mild pain, moderate pain or headache.    Historical Provider, MD  albuterol (PROVENTIL HFA;VENTOLIN HFA) 108 (90 BASE) MCG/ACT inhaler Inhale 2 puffs into the lungs every 4 (four) hours as needed for wheezing or shortness of breath. Patient not taking: Reported on 05/16/2016 10/27/14   Marylene Land, NP  methocarbamol (ROBAXIN) 500 MG tablet Take 1 tablet (500 mg total) by mouth 2 (two) times daily as needed for muscle spasms. Patient not taking: Reported on 05/16/2016 10/26/15   Antonietta Breach, PA-C  oxyCODONE-acetaminophen (PERCOCET/ROXICET) 5-325 MG tablet Take 1-2 tablets by mouth every 6 (six) hours as needed for moderate pain or severe pain. Patient not taking: Reported on 05/16/2016 10/26/15   Antonietta Breach, PA-C  pantoprazole (PROTONIX) 40 MG tablet Take 1 tablet (40 mg total) by mouth daily. 05/16/16   Tobie Poet, DO  sucralfate (CARAFATE) 1 GM/10ML suspension Take 10 mLs (1 g total) by mouth 4 (four) times daily -  with meals and at bedtime. 07/11/15 07/18/15  Carmin Muskrat, MD  traMADol (ULTRAM) 50 MG tablet Take 1 tablet (50 mg total) by mouth every 6 (six) hours as needed. Patient not taking: Reported on 05/16/2016 07/11/15   Carmin Muskrat, MD    Family History Family History  Problem Relation Age of Onset  . Diabetes Mother   . Diabetes Maternal Aunt   . Hypertension Father     Social History Social History  Substance Use Topics  . Smoking  status: Never Smoker  . Smokeless tobacco: Never Used  . Alcohol use Yes     Allergies   Tramadol   Review of Systems Review of Systems  Constitutional: Positive for chills. Negative for fever.  Eyes: Negative for visual disturbance.  Respiratory: Negative for cough and shortness of breath.   Cardiovascular: Negative for chest pain.  Gastrointestinal: Positive for abdominal pain, constipation, flatus, nausea and vomiting. Negative for abdominal distention, blood in stool, diarrhea, hematochezia and melena.  Genitourinary:  Negative for dysuria, frequency, hematuria, pelvic pain and vaginal discharge.  Musculoskeletal: Negative for back pain.  Skin: Negative for rash.  Neurological: Negative for syncope and headaches.  All other systems reviewed and are negative.    Physical Exam Updated Vital Signs BP 134/74   Pulse (!) 50   Temp 97.8 F (36.6 C) (Oral)   Resp 15   LMP 05/14/2016   SpO2 94%   Physical Exam  Constitutional: She appears well-developed and well-nourished. No distress.  HENT:  Head: Normocephalic and atraumatic.  Mouth/Throat: Oropharynx is clear and moist.  Eyes: Conjunctivae are normal. No scleral icterus.  Neck: Neck supple.  Cardiovascular: Normal rate and regular rhythm.   No murmur heard. Pulmonary/Chest: Effort normal and breath sounds normal. No respiratory distress. She has no wheezes. She has no rales.  Abdominal: Soft. Bowel sounds are normal. There is tenderness in the right upper quadrant and epigastric area. There is CVA tenderness (R) and positive Murphy's sign. There is no rigidity, no rebound, no guarding and no tenderness at McBurney's point. No hernia.  Musculoskeletal: She exhibits no edema.  Neurological: She is alert.  Skin: Skin is warm and dry.  Psychiatric: She has a normal mood and affect.  Nursing note and vitals reviewed.    ED Treatments / Results  Labs (all labs ordered are listed, but only abnormal results are displayed) Labs Reviewed  COMPREHENSIVE METABOLIC PANEL - Abnormal; Notable for the following:       Result Value   Creatinine, Ser 1.75 (*)    GFR calc non Af Amer 33 (*)    GFR calc Af Amer 38 (*)    All other components within normal limits  URINALYSIS, ROUTINE W REFLEX MICROSCOPIC - Abnormal; Notable for the following:    Hgb urine dipstick MODERATE (*)    Protein, ur 30 (*)    Squamous Epithelial / LPF 0-5 (*)    All other components within normal limits  LIPASE, BLOOD  CBC  POC URINE PREG, ED    EKG  EKG  Interpretation None       Radiology Ct Renal Stone Study  Result Date: 05/16/2016 CLINICAL DATA:  Right flank pain for 2 days.  Nausea and vomiting. EXAM: CT ABDOMEN AND PELVIS WITHOUT CONTRAST TECHNIQUE: Multidetector CT imaging of the abdomen and pelvis was performed following the standard protocol without IV contrast. COMPARISON:  None. FINDINGS: Lower chest: No acute abnormality. Hepatobiliary: No focal liver abnormality is seen. No gallstones, gallbladder wall thickening, or biliary dilatation. Pancreas: Unremarkable. No pancreatic ductal dilatation or surrounding inflammatory changes. Spleen: Normal in size without focal abnormality. Adrenals/Urinary Tract: Adrenal glands are unremarkable. Kidneys are normal, without renal calculi, focal lesion, or hydronephrosis. Bladder is unremarkable. Stomach/Bowel: Stomach is within normal limits. Appendix is normal. No evidence of bowel wall thickening, distention, or inflammatory changes. Vascular/Lymphatic: No significant vascular findings are present. No enlarged abdominal or pelvic lymph nodes. Reproductive: Uterus and bilateral adnexa are unremarkable. Other: No acute inflammatory changes. No ascites. Small  fat containing umbilical hernia. Musculoskeletal: No significant skeletal lesion IMPRESSION: Normal appendix. No acute findings. No significant abnormality. Small fat containing umbilical hernia. Electronically Signed   By: Andreas Newport M.D.   On: 05/16/2016 21:01   US Abdomen Limited Ruq  Result Date: 05/16/2016 CLINICAL DATA:  Right upper quadrant pain and vomiting EXAM: US ABDOMEN LIMITED - RIGHT UPPER QUADRANT COMPARISON:  01/16/2010 FINDINGS: Gallbladder: No gallstones or wall thickening visualized. No sonographic Murphy sign noted by sonographer. Common bile duct: Diameter: 5.5 mm Liver: Diffuse increased heterogeneous echogenicity consistent with fatty infiltration. 1.3 x 1.2 x 1.5 cm hypoechoic area adjacent to the gallbladder fossa  could relate to a small focus of fatty sparing or a hypoechoic nodule. IMPRESSION: 1. No sonographic evidence for cholelithiasis, cholecystitis, or biliary dilatation 2. Heterogenous echogenic liver consistent with fatty infiltration. 1.5 cm hypoechoic area adjacent to the gallbladder, may represent focal fatty sparing or a hypoechoic nodule. Follow-up nonemergent MRI could be attempted for further evaluation. Electronically Signed   By: Donavan Foil M.D.   On: 05/16/2016 18:46    Procedures Procedures (including critical care time)  Medications Ordered in ED Medications  sodium chloride 0.9 % bolus 1,000 mL (0 mLs Intravenous Stopped 05/16/16 1924)  ondansetron (ZOFRAN) injection 4 mg (4 mg Intravenous Given 05/16/16 1646)  HYDROmorphone (DILAUDID) injection 1 mg (1 mg Intravenous Given 05/16/16 1858)  ondansetron (ZOFRAN) injection 4 mg (4 mg Intravenous Given 05/16/16 2059)     Initial Impression / Assessment and Plan / ED Course  I have reviewed the triage vital signs and the nursing notes.  Pertinent labs & imaging results that were available during my care of the patient were reviewed by me and considered in my medical decision making (see chart for details).  Clinical Course     Patient is a 50 year old female who has history of hypertension, migraines, gout, morbid obesity who presents with 2 days of right upper quadrant pain worse after eating. Patient has had milder vague right upper quadrant pain that is worse postprandial over the last 3 weeks however over the last 2 days this pain is acutely worsened.  Patient was seen at urgent care and was given 1 dose of Dilaudid and Zofran with improvement in symptoms. Patient was sent here for evaluation of gallstones. On exam patient has positive Murphy's sign and mild epigastric tenderness. Abdomen is non-peritoneal ache. No obvious distention.  Vital quadrant ultrasound obtained which shows no gallstones or any signs of cholecystitis  or biliary duct dilatation. She also had right flank pain on exam with positive CVA tenderness. There was blood in the urine however she just finished her menstrual cycle. CT stone study obtained which showed no evidence of nephrolithiasis for any kidney stones. CT did not show any acute findings in the abdomen. Patient had normal LFTs and no leukocytosis.  Given the negative imaging studies and benign labs patient may have a bundle ulcer based on postprandial pain around 2 hours after eating. Patient discharged home on 1 month of pantoprazole. Patient follow-up with PCP in one week to follow up with symptoms. Patient encouraged to stick to a bland diet and avoid spicy, acidic, greasy foods. Patient may take Tylenol for pain but was told to avoid any NSAIDs.  Pt seen with attending Dr. Oleta Mouse.  Final Clinical Impressions(s) / ED Diagnoses   Final diagnoses:  RUQ pain  Right upper quadrant abdominal pain    New Prescriptions Discharge Medication List as of 05/16/2016  9:25 PM  Tobie Poet, DO 05/17/16 3601    Forde Dandy, MD 05/17/16 1154

## 2016-05-16 NOTE — ED Notes (Signed)
Patient transported to [facility] 

## 2016-05-16 NOTE — ED Triage Notes (Signed)
r   Upper  Quadrant  Pain  X  sev  Weeks      Worse  Today   Vomited  Yesterday   Worse   Today        Bloating   Sensation     Symptoms      Worse  After  Eating     No  diarrhea

## 2016-05-16 NOTE — ED Notes (Signed)
Pt verbalized understanding of DC teaching and medication with no questions. MD also spoke with pt about avoiding NSAIDS. Pt ambulatory at DC. NAD. VSS.

## 2016-05-16 NOTE — Discharge Instructions (Signed)
Go directly to ER  

## 2016-05-21 LAB — POC URINE PREG, ED: Preg Test, Ur: NEGATIVE

## 2016-10-16 ENCOUNTER — Emergency Department (HOSPITAL_COMMUNITY): Payer: Self-pay

## 2016-10-16 ENCOUNTER — Encounter (HOSPITAL_COMMUNITY): Payer: Self-pay

## 2016-10-16 ENCOUNTER — Emergency Department (HOSPITAL_COMMUNITY)
Admission: EM | Admit: 2016-10-16 | Discharge: 2016-10-16 | Disposition: A | Discharge status: Home / Self Care | Payer: Self-pay | Attending: Emergency Medicine | Admitting: Emergency Medicine

## 2016-10-16 ENCOUNTER — Encounter: Payer: Self-pay | Admitting: Physician Assistant

## 2016-10-16 DIAGNOSIS — Z79899 Other long term (current) drug therapy: Secondary | ICD-10-CM | POA: Insufficient documentation

## 2016-10-16 DIAGNOSIS — N183 Chronic kidney disease, stage 3 (moderate): Secondary | ICD-10-CM | POA: Insufficient documentation

## 2016-10-16 DIAGNOSIS — I129 Hypertensive chronic kidney disease with stage 1 through stage 4 chronic kidney disease, or unspecified chronic kidney disease: Secondary | ICD-10-CM | POA: Insufficient documentation

## 2016-10-16 DIAGNOSIS — K859 Acute pancreatitis without necrosis or infection, unspecified: Secondary | ICD-10-CM | POA: Insufficient documentation

## 2016-10-16 DIAGNOSIS — K2900 Acute gastritis without bleeding: Secondary | ICD-10-CM | POA: Insufficient documentation

## 2016-10-16 DIAGNOSIS — R1010 Upper abdominal pain, unspecified: Secondary | ICD-10-CM

## 2016-10-16 LAB — CBC
HCT: 38.9 % (ref 36.0–46.0)
Hemoglobin: 13.3 g/dL (ref 12.0–15.0)
MCH: 28.5 pg (ref 26.0–34.0)
MCHC: 34.2 g/dL (ref 30.0–36.0)
MCV: 83.5 fL (ref 78.0–100.0)
Platelets: 237 10*3/uL (ref 150–400)
RBC: 4.66 MIL/uL (ref 3.87–5.11)
RDW: 13.5 % (ref 11.5–15.5)
WBC: 8.3 10*3/uL (ref 4.0–10.5)

## 2016-10-16 LAB — URINALYSIS, ROUTINE W REFLEX MICROSCOPIC
Bilirubin Urine: NEGATIVE
Glucose, UA: NEGATIVE mg/dL
Ketones, ur: NEGATIVE mg/dL
Nitrite: NEGATIVE
Protein, ur: NEGATIVE mg/dL
Specific Gravity, Urine: 1.019 (ref 1.005–1.030)
pH: 5 (ref 5.0–8.0)

## 2016-10-16 LAB — COMPREHENSIVE METABOLIC PANEL
ALT: 21 U/L (ref 14–54)
AST: 24 U/L (ref 15–41)
Albumin: 3.7 g/dL (ref 3.5–5.0)
Alkaline Phosphatase: 61 U/L (ref 38–126)
Anion gap: 9 (ref 5–15)
BUN: 22 mg/dL — ABNORMAL HIGH (ref 6–20)
CO2: 22 mmol/L (ref 22–32)
Calcium: 9.2 mg/dL (ref 8.9–10.3)
Chloride: 106 mmol/L (ref 101–111)
Creatinine, Ser: 1.85 mg/dL — ABNORMAL HIGH (ref 0.44–1.00)
GFR calc Af Amer: 35 mL/min — ABNORMAL LOW (ref >60)
GFR calc non Af Amer: 30 mL/min — ABNORMAL LOW (ref >60)
Glucose, Bld: 94 mg/dL (ref 65–99)
Potassium: 4 mmol/L (ref 3.5–5.1)
Sodium: 137 mmol/L (ref 135–145)
Total Bilirubin: 0.4 mg/dL (ref 0.3–1.2)
Total Protein: 7 g/dL (ref 6.5–8.1)

## 2016-10-16 LAB — TROPONIN I: Troponin I: <0.03 ng/mL (ref <0.03)

## 2016-10-16 LAB — LIPASE, BLOOD: Lipase: 134 U/L — ABNORMAL HIGH (ref 11–51)

## 2016-10-16 MED ORDER — PANTOPRAZOLE SODIUM 20 MG PO TBEC: 20.0000 mg | DELAYED_RELEASE_TABLET | Freq: Every day | ORAL | 0 refills | Status: DC

## 2016-10-16 MED ORDER — IOPAMIDOL (ISOVUE-300) INJECTION 61%
30.0000 mL | Freq: Once | INTRAVENOUS | Status: AC | PRN
  Administered 2016-10-16: 30 mL via ORAL

## 2016-10-16 MED ORDER — HYDROMORPHONE HCL 1 MG/ML IJ SOLN
1.0000 mg | Freq: Once | INTRAMUSCULAR | Status: AC
  Administered 2016-10-16: 1 mg via INTRAVENOUS
  Filled 2016-10-16: qty 1

## 2016-10-16 MED ORDER — IOPAMIDOL (ISOVUE-300) INJECTION 61%
INTRAVENOUS | Status: AC
  Administered 2016-10-16: 30 mL via ORAL
  Filled 2016-10-16: qty 30

## 2016-10-16 NOTE — Discharge Instructions (Signed)
Follow the low-fat diet for pancreatitis in your discharge instructions.  Take Protonix daily.  Schedule recheck with your family doctor and the gastroenterologist group listed in your discharge instructions.  Return to the emergency department if your  symptoms are worsening.

## 2016-10-16 NOTE — ED Triage Notes (Signed)
Pt complains of right upper quad abd pain for two months, she has been seen and told it was an ulcer or her gallbladder Pt states that pain meds help but then the pain comes back and that's not the solution she wants

## 2016-10-16 NOTE — ED Provider Notes (Signed)
Patient was accepted from Dr. Venora Maples at shift change for review of CT scan and final disposition. CT does not show any pancreatic inflammation. Patient does have mild elevation in lipase. She is counseled on dietary management of pancreatitis and given discharge instructions including information. As all abdominal pain is upper and epigastric in nature, I will also have her initiate Protonix for potential gastritis or occult peptic ulcer disease. Patient has no urinary symptoms and UA is consistent with contamination. No empiric antibiotics at this time. Patient is made aware of the follow-up plan of seeing both her family doctor as well as gastroenterology for recheck. She is also made aware finding of fatty liver in the necessary follow-up plan.   Heidi Shanks, MD 10/16/16 [phone]

## 2016-10-16 NOTE — ED Notes (Signed)
Off floor for testing 

## 2016-10-16 NOTE — ED Provider Notes (Signed)
[facility] DEPT Provider Note   CSN: [id number] Arrival date & time: 10/16/16  0136  By signing my name below, I, Lise Auer, attest that this documentation has been prepared under the direction and in the presence of Jola Schmidt, MD. Electronically Signed: Lise Auer, ED Scribe. 10/16/16. 4:09 AM.  History   Chief Complaint Chief Complaint  Patient presents with  . Abdominal Pain   The history is provided by the patient. No language interpreter was used.    HPI Comments: Heidi Rivera is a 51 y.o. female with HTN, who presents to the Emergency Department complaining of gradually worsening, consistent right upper quadrant abdominal pain that started two months ago. Her associated symptoms include muscle spasms, fatigue, non-cardiac chest pain, and back pain. Pt notes she has had this abdominal pain for a few months and was seen previously for this issue but there was no definite diagnosis. Pt reports her the pain from her abdomen radiates into her chest the way into her back. She was told the issue may have stemmed from her gallbladder, so she has changed her eating habitat and has been taking Pepcid AC with no relief. Pt is unable to flat due to pain. Denies any recent falls.  Denies shortness of breath or cough.    Past Medical History:  Diagnosis Date  . Gout   . Hypertension   . Migraines   . Obesity   . Vertigo     Patient Active Problem List   Diagnosis Date Noted  . Rotator cuff injury 06/04/2011  . MIGRAINE WITH AURA 07/11/2008  . CHRONIC KIDNEY DISEASE STAGE III (MODERATE) 07/11/2008  . ESSENTIAL HYPERTENSION, BENIGN 08/30/2007  . OBESITY, NOS 07/23/2006    Past Surgical History:  Procedure Laterality Date  . HEMORRHOID SURGERY      OB History    No data available       Home Medications    Prior to Admission medications   Medication Sig Start Date End Date Taking? Authorizing Provider  acetaminophen (TYLENOL) 500 MG tablet Take 1,000 mg  by mouth every 6 (six) hours as needed for mild pain, moderate pain or headache.    [provider]  albuterol (PROVENTIL HFA;VENTOLIN HFA) 108 (90 BASE) MCG/ACT inhaler Inhale 2 puffs into the lungs every 4 (four) hours as needed for wheezing or shortness of breath. Patient not taking: Reported on 05/16/2016 10/27/14   Marylene Land, NP  hydrochlorothiazide (HYDRODIURIL) 25 MG tablet Take 1 tablet (25 mg total) by mouth daily. 03/24/13   Lind Covert, MD  HYDROmorphone HCl (DILAUDID IJ) Inject 2 mg as directed once.    [provider]  methocarbamol (ROBAXIN) 500 MG tablet Take 1 tablet (500 mg total) by mouth 2 (two) times daily as needed for muscle spasms. Patient not taking: Reported on 05/16/2016 10/26/15   Antonietta Breach, PA-C  ondansetron (ZOFRAN-ODT) 4 MG disintegrating tablet Take 4 mg by mouth once.    [provider]  oxyCODONE-acetaminophen (PERCOCET/ROXICET) 5-325 MG tablet Take 1-2 tablets by mouth every 6 (six) hours as needed for moderate pain or severe pain. Patient not taking: Reported on 05/16/2016 10/26/15   Antonietta Breach, PA-C  pantoprazole (PROTONIX) 40 MG tablet Take 1 tablet (40 mg total) by mouth daily. 05/16/16   Tobie Poet, DO  sucralfate (CARAFATE) 1 GM/10ML suspension Take 10 mLs (1 g total) by mouth 4 (four) times daily -  with meals and at bedtime. 07/11/15 07/18/15  Carmin Muskrat, MD  traMADol Veatrice Bourbon) 50 MG  tablet Take 1 tablet (50 mg total) by mouth every 6 (six) hours as needed. Patient not taking: Reported on 05/16/2016 07/11/15   Carmin Muskrat, MD    Family History Family History  Problem Relation Age of Onset  . Diabetes Mother   . Diabetes Maternal Aunt   . Hypertension Father     Social History Social History  Substance Use Topics  . Smoking status: Never Smoker  . Smokeless tobacco: Never Used  . Alcohol use Yes     Allergies   Tramadol   Review of Systems Review of Systems  Constitutional: Positive for  fatigue.  Respiratory: Negative for cough and shortness of breath.   Musculoskeletal: Positive for myalgias.       Muscle spasms.     A complete 10 system review of systems was obtained and all systems are negative except as noted in the HPI and PMH.   Physical Exam Updated Vital Signs BP (!) 185/107 (BP Location: Right Arm)   Pulse 72   Temp 98 F (36.7 C) (Oral)   Resp 20   LMP 09/18/2016   SpO2 100%   Physical Exam  Constitutional: She is oriented to person, place, and time. She appears well-developed and well-nourished. No distress.  HENT:  Head: Normocephalic and atraumatic.  Eyes: EOM are normal.  Neck: Normal range of motion.  Cardiovascular: Normal rate, regular rhythm and normal heart sounds.   Pulmonary/Chest: Effort normal and breath sounds normal.  Right lateral chest wall tender. No rash. No bruises.    Abdominal: Soft. She exhibits no distension. There is no tenderness.  Musculoskeletal: Normal range of motion.  Neurological: She is alert and oriented to person, place, and time.  Skin: Skin is warm and dry.  Psychiatric: She has a normal mood and affect. Judgment normal.  Nursing note and vitals reviewed.   ED Treatments / Results  DIAGNOSTIC STUDIES: Oxygen Saturation is 100% on RA, normal by my interpretation.   COORDINATION OF CARE: 3:04 AM-Discussed next steps with pt. Pt verbalized understanding and is agreeable with the plan.   Labs (all labs ordered are listed, but only abnormal results are displayed) Labs Reviewed  LIPASE, BLOOD - Abnormal; Notable for the following:       Result Value   Lipase 134 (*)    All other components within normal limits  COMPREHENSIVE METABOLIC PANEL - Abnormal; Notable for the following:    BUN 22 (*)    Creatinine, Ser 1.85 (*)    GFR calc non Af Amer 30 (*)    GFR calc Af Amer 35 (*)    All other components within normal limits  URINALYSIS, ROUTINE W REFLEX MICROSCOPIC - Abnormal; Notable for the following:      Hgb urine dipstick MODERATE (*)    Leukocytes, UA SMALL (*)    Bacteria, UA RARE (*)    Squamous Epithelial / LPF 6-30 (*)    All other components within normal limits  URINE CULTURE  CBC  TROPONIN I    EKG  EKG Interpretation  Date/Time:  Thursday Oct 16 2016 03:36:14 EDT Ventricular Rate:  56 PR Interval:    QRS Duration: 101 QT Interval:  475 QTC Calculation: 459 R Axis:   28 Text Interpretation:  Sinus rhythm Borderline T abnormalities, diffuse leads No significant change was found Confirmed by Macarius Ruark  MD, Dorwin Fitzhenry ([id number]) on 10/16/2016 4:25:20 AM       Radiology Dg Chest 2 View  Result Date: 10/16/2016 CLINICAL DATA:  Subacute  onset of right lateral chest pain. Initial encounter. EXAM: CHEST  2 VIEW COMPARISON:  Chest radiograph from 07/11/2015 FINDINGS: The lungs are well-aerated. Mild vascular congestion is noted. There is no evidence of focal opacification, pleural effusion or pneumothorax. The heart is normal in size; the mediastinal contour is within normal limits. No acute osseous abnormalities are seen. Anterior bridging osteophytes are noted along the lower thoracic spine. IMPRESSION: Mild vascular congestion noted.  Lungs remain grossly clear. Electronically Signed   By: Garald Balding M.D.   On: 10/16/2016 03:31   US Abdomen Limited Ruq  Result Date: 10/16/2016 CLINICAL DATA:  Right upper quadrant pain for 2 months. EXAM: US ABDOMEN LIMITED - RIGHT UPPER QUADRANT COMPARISON:  CT abdomen and pelvis 05/16/2016. Ultrasound abdomen 05/16/2016. FINDINGS: Gallbladder: No gallstones or wall thickening visualized. No sonographic Murphy sign noted by sonographer. Common bile duct: Diameter: 2 mm, normal Liver: Diffusely increased hepatic parenchymal echotexture suggesting fatty infiltration. No focal lesions identified. Examination was technically limited due to patient body habitus and difficulty in positioning patient. IMPRESSION: No evidence of cholelithiasis or cholecystitis.  Electronically Signed   By: Lucienne Capers M.D.   On: 10/16/2016 05:04    Procedures Procedures (including critical care time)  Medications Ordered in ED Medications  iopamidol (ISOVUE-300) 61 % injection 30 mL (30 mLs Oral Contrast Given 10/16/16 0615)  HYDROmorphone (DILAUDID) injection 1 mg (1 mg Intravenous Given 10/16/16 3220)     Initial Impression / Assessment and Plan / ED Course  I have reviewed the triage vital signs and the nursing notes.  Pertinent labs & imaging results that were available during my care of the patient were reviewed by me and considered in my medical decision making (see chart for details).     Still with ongoing right upper quadrant pain.  Ultrasound images a significant abnormality.  LFTs are without significant abnormality.  Lipase is surprisingly elevated at 134.  Patient will undergo CT imaging to further evaluate.  If CT is normal and feels that the patient can safely be discharged home.  8:05 AM Care to Dr Johnney Killian  Final Clinical Impressions(s) / ED Diagnoses   Final diagnoses:  None    New Prescriptions New Prescriptions   No medications on file   I personally performed the services described in this documentation, which was scribed in my presence. The recorded information has been reviewed and is accurate.        Jola Schmidt, MD 10/16/16 [phone]

## 2016-10-17 LAB — URINE CULTURE

## 2016-10-23 ENCOUNTER — Encounter: Payer: Self-pay | Admitting: Physician Assistant

## 2016-10-23 ENCOUNTER — Ambulatory Visit (INDEPENDENT_AMBULATORY_CARE_PROVIDER_SITE_OTHER): Payer: Self-pay | Admitting: Physician Assistant

## 2016-10-23 VITALS — BP 130/72 | HR 84 | Ht 69.5 in | Wt 297.0 lb

## 2016-10-23 DIAGNOSIS — R110 Nausea: Secondary | ICD-10-CM

## 2016-10-23 DIAGNOSIS — R1013 Epigastric pain: Secondary | ICD-10-CM

## 2016-10-23 DIAGNOSIS — R1011 Right upper quadrant pain: Secondary | ICD-10-CM

## 2016-10-23 DIAGNOSIS — K760 Fatty (change of) liver, not elsewhere classified: Secondary | ICD-10-CM

## 2016-10-23 DIAGNOSIS — Z1211 Encounter for screening for malignant neoplasm of colon: Secondary | ICD-10-CM

## 2016-10-23 MED ORDER — NA SULFATE-K SULFATE-MG SULF 17.5-3.13-1.6 GM/177ML PO SOLN: 1.0000 | Freq: Once | ORAL | 0 refills | Status: AC

## 2016-10-23 MED ORDER — HYDROCODONE-ACETAMINOPHEN 5-325 MG PO TABS: ORAL_TABLET | ORAL | 0 refills | Status: DC

## 2016-10-23 MED ORDER — SULINDAC 150 MG PO TABS: ORAL_TABLET | ORAL | 0 refills | Status: DC

## 2016-10-23 NOTE — Patient Instructions (Signed)
Continue the Protonix 40 mg, take 1 tablet by mouth every morning.  We sent a prescription to your pharmacy for Clinoril 150 mg.  Take 1 talbet with food twice daily for 14 days.  We have given you a signed prescription for Hydrocodone 5/ 325 mg.  We have also provided a work note.   You have been scheduled for an endoscopy and colonoscopy. Please follow the written instructions given to you at your visit today. Please pick up your prep supplies at the pharmacy within the next 1-3 days. If you use inhalers (even only as needed), please bring them with you on the day of your procedure. Your physician has requested that you go to www.startemmi.com and enter the access code given to you at your visit today. This web site gives a general overview about your procedure. However, you should still follow specific instructions given to you by our office regarding your preparation for the procedure.

## 2016-10-23 NOTE — Progress Notes (Signed)
Subjective:    Patient ID: Heidi Rivera, female    DOB: [birth year], 51 y.o.   MRN: [id number]  HPI Heidi Rivera is a pleasant 51 year old African-American female, referred by the emergency room after recent ER visit on 10/16/2016. Her PCP is Dr. Jannette Fogo  in [facility] Patient has not had any previous GI evaluation. She has history of hypertension, migraine headaches, chronic kidney disease stage III, and morbid obesity with BMI of 43. She had an ER visit on 10/16/2016 with complaints of upper abdominal pain and right upper quadrant pain. She states that she's been having pain over the past several months which has progressed. Ultrasound on 10/16/2016 showed no gallstones, common bile duct of 2 mm and fatty changes in the liver.  CT of the abdomen and pelvis on 10/16/2016 showed no pancreatic inflammation or abnormality, and fatty liver. This was done without IV contrast. Labs showed an elevated lipase of 134, LFTs within normal limits, CBC within normal limits BUN 22 and creatinine of 1.85. Patient was started on Protonix 40 mg daily and asked to follow up with GI. Again patient says that she's been having recurrent pain since December and it's been constant and progressive. She describes a right upper quadrant "wedge" type feeling as if there is something lodged in there that needs to be removed". She says this goes from her right upper quadrant around into her right back. She is generally worse at night. She says she can't lay on her right side, and also has difficulty changing position and getting up from sleeping due to increase in discomfort. She says sometimes bending over bothers her  . She's not had any known injury or previous back issues. She does describe some nausea without vomiting. She says she is hungry all the time but eats small amounts and generally blander foods. She feels that she's been filling up a bit quicker than usual. Weight has been stable no dysphagia or  odynophagia. Bowel movements have been normal for her no melena or hematochezia. She takes Excedrin Migraine periodically but not on a regular basis. Family history is negative for colon cancer and polyps   Review of Systems Pertinent positive and negative review of systems were noted in the above HPI section.  All other review of systems was otherwise negative.  Outpatient Encounter Prescriptions as of 10/23/2016  Medication Sig  . albuterol (PROVENTIL HFA;VENTOLIN HFA) 108 (90 BASE) MCG/ACT inhaler Inhale 2 puffs into the lungs every 4 (four) hours as needed for wheezing or shortness of breath.  . famotidine (PEPCID AC) 10 MG chewable tablet Chew 10 mg by mouth 2 (two) times daily as needed for heartburn.  . hydrochlorothiazide (HYDRODIURIL) 25 MG tablet Take 1 tablet (25 mg total) by mouth daily.  . sucralfate (CARAFATE) 1 GM/10ML suspension Take 10 mLs (1 g total) by mouth 4 (four) times daily -  with meals and at bedtime.  [name] HYDROcodone-acetaminophen (NORCO/VICODIN) 5-325 MG tablet Take 1 tablet by mouth every 8 hours as needed for pain.  . Na Sulfate-K Sulfate-Mg Sulf 17.5-3.13-1.6 GM/180ML SOLN Take 1 kit by mouth once.  . sulindac (CLINORIL) 150 MG tablet Take 1 tablet by mouth twice daily with food.  . [DISCONTINUED] acetaminophen (TYLENOL) 500 MG tablet Take 1,000 mg by mouth every 6 (six) hours as needed for mild pain, moderate pain or headache.  . [DISCONTINUED] methocarbamol (ROBAXIN) 500 MG tablet Take 1 tablet (500 mg total) by mouth 2 (two) times daily as needed for muscle spasms. (  Patient not taking: Reported on 05/16/2016)  . [DISCONTINUED] oxyCODONE-acetaminophen (PERCOCET/ROXICET) 5-325 MG tablet Take 1-2 tablets by mouth every 6 (six) hours as needed for moderate pain or severe pain. (Patient not taking: Reported on 05/16/2016)  . [DISCONTINUED] pantoprazole (PROTONIX) 20 MG tablet Take 1 tablet (20 mg total) by mouth daily.  . [DISCONTINUED] pantoprazole (PROTONIX) 40 MG  tablet Take 1 tablet (40 mg total) by mouth daily. (Patient not taking: Reported on 10/16/2016)  . [DISCONTINUED] traMADol (ULTRAM) 50 MG tablet Take 1 tablet (50 mg total) by mouth every 6 (six) hours as needed. (Patient not taking: Reported on 05/16/2016)   No facility-administered encounter medications on file as of 10/23/2016.    Allergies  Allergen Reactions  . Tramadol Other (See Comments)    Intolerance : GI Upset   Patient Active Problem List   Diagnosis Date Noted  . Rotator cuff injury 06/04/2011  . MIGRAINE WITH AURA 07/11/2008  . CHRONIC KIDNEY DISEASE STAGE III (MODERATE) 07/11/2008  . ESSENTIAL HYPERTENSION, BENIGN 08/30/2007  . OBESITY, NOS 07/23/2006   Social History   Social History  . Marital status: Married    Spouse name: N/A  . Number of children: 3  . 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 Smoking status: Never Smoker  . Smokeless tobacco: Never Used  . Alcohol use Yes     Comment: rare  . Drug use: No  . Sexual activity: Not on file   Other Topics Concern  . Not on file   Social History Narrative   Lives with sister and her husband who smokes     Ms. Simien Nunley's family history includes Breast cancer in her mother; Cancer - Other in her paternal grandmother; Colon cancer in her maternal uncle; Diabetes in her maternal aunt and mother; Hypercholesterolemia in her father; Hypertension in her father; Other in her father and mother.      Objective:    Vitals:   10/23/16 1013  BP: 130/72  Pulse: 84    Physical Exam  well-developed African-American female in no acute distress, accompanied by her mother, blood pressure 130/72 pulse 84, height 5 foot 9.5 weight 297, BMI of 43.2. HEENT ;nontraumatic normocephalic EOMI PERRLA sclera anicteric, Cardiovascular r;egular rate and rhythm with S1-S2 no murmur rub or gallop, Pulmonary; clear bilaterally, Abdomen; soft, just mild tenderness in  the epigastrium there is no guarding or rebound no palpable mass or hepatosplenomegaly, she is very tender along the right costal margin anteriorly and along the lower anterior ribs on the right and around into the right back., Rectal ;exam not done, Ext; is no clubbing cyanosis or edema skin warm and dry, Neuropsych; mood and affect appropriate       Assessment & Plan:   #24 51 year old African-American female with 5 month history of right upper quadrant, epigastric pain radiating around into the right side and back. By exam I suspect her pain is musculoskeletal, and probably costochondritis type picture Patient had an isolated elevated lipase with recent ER visit but no CT evidence for acute pancreatitis. (Question elevated lipase secondary to renal insufficiency ) Rule out gastropathy or peptic ulcer disease  #2 intermittent nausea #3 colon cancer surveillance-no prior colonoscopy #4 hypertension #5 chronic kidney disease stage III #6 morbid obesity-BMI of 43 #7 Fatty liver on recent imaging  Plan; moist heat to right lower anterior ribs Start Clinoril 150 mg by mouth twice a day  2 weeks to be taken with food Continue Protonix 40 mg by mouth every morning Schedule for upper endoscopy and colonoscopy with Dr. Fuller Plan. Procedures discussed in detail with the patient including risks and benefits and she is agreeable to proceed Patient asked for pain medication to use at night, she says she is intolerant to tramadol with nausea and vomiting. We'll give her a limited supply of hydrocodone 5/325 one by mouth daily at bedtime when necessary for pain #30 and no refills. Told her we would not continue to give her narcotics for this pain.  Amy S Esterwood PA-C 10/23/2016   Cc: Raelyn Number, MD

## 2016-10-23 NOTE — Progress Notes (Signed)
Reviewed and agree with management plan.  Adreona Brand T. Jafeth Mustin, MD FACG 

## 2016-11-24 ENCOUNTER — Telehealth: Payer: Self-pay | Admitting: Gastroenterology

## 2016-11-24 NOTE — Telephone Encounter (Signed)
Patient reports worsening abdominal and chest pain. She says that her pain is excruciating, but she reports that she is at work today. She is advised that if she is in excruciating pain she should go to the ED.  She relates that her pain is much worse when getting up and down out of the chair and making position changes .She is having worsening pain and now feels that she is having some SOB with it.  She has the pain meds that Amy Esterwood PA gave her to use at night to sleep.  She is advised that Nicoletta Ba PA thought that her pain was most likely musculoskeletal possibly costochondritis. [name] She will try seeing her primary care.  She will keep her appts for next week with Dr. Fuller Plan for procedures.

## 2016-12-05 ENCOUNTER — Encounter: Payer: Self-pay | Admitting: Gastroenterology

## 2016-12-05 ENCOUNTER — Ambulatory Visit (AMBULATORY_SURGERY_CENTER): Payer: Self-pay | Admitting: Gastroenterology

## 2016-12-05 VITALS — BP 171/86 | HR 62 | Temp 98.9°F | Resp 18 | Ht 69.5 in | Wt 297.0 lb

## 2016-12-05 DIAGNOSIS — R110 Nausea: Secondary | ICD-10-CM

## 2016-12-05 DIAGNOSIS — Z1212 Encounter for screening for malignant neoplasm of rectum: Secondary | ICD-10-CM

## 2016-12-05 DIAGNOSIS — K2950 Unspecified chronic gastritis without bleeding: Secondary | ICD-10-CM

## 2016-12-05 DIAGNOSIS — K319 Disease of stomach and duodenum, unspecified: Secondary | ICD-10-CM

## 2016-12-05 DIAGNOSIS — Z1211 Encounter for screening for malignant neoplasm of colon: Secondary | ICD-10-CM

## 2016-12-05 DIAGNOSIS — R1011 Right upper quadrant pain: Secondary | ICD-10-CM

## 2016-12-05 DIAGNOSIS — B9681 Helicobacter pylori [H. pylori] as the cause of diseases classified elsewhere: Secondary | ICD-10-CM

## 2016-12-05 MED ORDER — SODIUM CHLORIDE 0.9 % IV SOLN: 500.0000 mL | INTRAVENOUS | Status: AC

## 2016-12-05 NOTE — Op Note (Signed)
[facility] Patient Name: Heidi Rivera Procedure Date: 12/05/2016 3:13 PM MRN: [id number] Endoscopist: Ladene Artist , MD Age: 51 Referring MD:  Date of Birth: 22-Jun-1965 Gender: Female Account #: [id number] Procedure:                Upper GI endoscopy Indications:              Abdominal pain in the right upper quadrant Medicines:                Monitored Anesthesia Care Procedure:                Pre-Anesthesia Assessment:                           - Prior to the procedure, a History and Physical                            was performed, and patient medications and                            allergies were reviewed. The patient's tolerance of                            previous anesthesia was also reviewed. The risks                            and benefits of the procedure and the sedation                            options and risks were discussed with the patient.                            All questions were answered, and informed consent                            was obtained. Prior Anticoagulants: The patient has                            taken no previous anticoagulant or antiplatelet                            agents. ASA Grade Assessment: II - A patient with                            mild systemic disease. After reviewing the risks                            and benefits, the patient was deemed in                            satisfactory condition to undergo the procedure.                           - Prior to the procedure, a History and Physical  was performed, and patient medications and                            allergies were reviewed. The patient's tolerance of                            previous anesthesia was also reviewed. The risks                            and benefits of the procedure and the sedation                            options and risks were discussed with the patient.                            All  questions were answered, and informed consent                            was obtained. Prior Anticoagulants: The patient has                            taken no previous anticoagulant or antiplatelet                            agents. ASA Grade Assessment: II - A patient with                            mild systemic disease. After reviewing the risks                            and benefits, the patient was deemed in                            satisfactory condition to undergo the procedure.                           After obtaining informed consent, the endoscope was                            passed under direct vision. Throughout the                            procedure, the patient's blood pressure, pulse, and                            oxygen saturations were monitored continuously. The                            Endoscope was introduced through the mouth, and                            advanced to the second part of duodenum. The upper  GI endoscopy was accomplished without difficulty.                            The patient tolerated the procedure well. Scope In: Scope Out: Findings:                 The examined esophagus was normal.                           Diffuse mildly erythematous mucosa without bleeding                            was found in the gastric fundus and in the gastric                            body. Biopsies were taken with a cold forceps for                            histology.                           The exam of the stomach was otherwise normal.                           The duodenal bulb and second portion of the                            duodenum were normal. Complications:            No immediate complications. Estimated Blood Loss:     Estimated blood loss was minimal. Impression:               - Normal esophagus.                           - Erythematous mucosa in the gastric fundus and                            gastric body.  Biopsied.                           - Normal duodenal bulb and second portion of the                            duodenum. Recommendation:           - Patient has a contact number available for                            emergencies. The signs and symptoms of potential                            delayed complications were discussed with the                            patient. Return to normal activities tomorrow.  Written discharge instructions were provided to the                            patient.                           - Resume previous diet.                           - Continue present medications.                           - Await pathology results. Ladene Artist, MD 12/05/2016 3:46:39 PM This report has been signed electronically.

## 2016-12-05 NOTE — Patient Instructions (Signed)
Impression/Recommendations:  Resume previous diet. Continue present medications.  Repeat colonoscopy in 10 years for screening purposes.  YOU HAD AN ENDOSCOPIC PROCEDURE TODAY AT [facility]:   Refer to the procedure report that was given to you for any specific questions about what was found during the examination.  If the procedure report does not answer your questions, please call your gastroenterologist to clarify.  If you requested that your care partner not be given the details of your procedure findings, then the procedure report has been included in a sealed envelope for you to review at your convenience later.  YOU SHOULD EXPECT: Some feelings of bloating in the abdomen. Passage of more gas than usual.  Walking can help get rid of the air that was put into your GI tract during the procedure and reduce the bloating. If you had a lower endoscopy (such as a colonoscopy or flexible sigmoidoscopy) you may notice spotting of blood in your stool or on the toilet paper. If you underwent a bowel prep for your procedure, you may not have a normal bowel movement for a few days.  Please Note:  You might notice some irritation and congestion in your nose or some drainage.  This is from the oxygen used during your procedure.  There is no need for concern and it should clear up in a day or so.  SYMPTOMS TO REPORT IMMEDIATELY:   Following lower endoscopy (colonoscopy or flexible sigmoidoscopy):  Excessive amounts of blood in the stool  Significant tenderness or worsening of abdominal pains  Swelling of the abdomen that is new, acute  Fever of 100F or higher   Following upper endoscopy (EGD)  Vomiting of blood or coffee ground material  New chest pain or pain under the shoulder blades  Painful or persistently difficult swallowing  New shortness of breath  Fever of 100F or higher  Black, tarry-looking stools  For urgent or emergent issues, a gastroenterologist can be reached  at any hour by calling [phone].   DIET:  We do recommend a small meal at first, but then you may proceed to your regular diet.  Drink plenty of fluids but you should avoid alcoholic beverages for 24 hours.  ACTIVITY:  You should plan to take it easy for the rest of today and you should NOT DRIVE or use heavy machinery until tomorrow (because of the sedation medicines used during the test).    FOLLOW UP: Our staff will call the number listed on your records the next business day following your procedure to check on you and address any questions or concerns that you may have regarding the information given to you following your procedure. If we do not reach you, we will leave a message.  However, if you are feeling well and you are not experiencing any problems, there is no need to return our call.  We will assume that you have returned to your regular daily activities without incident.  If any biopsies were taken you will be contacted by phone or by letter within the next 1-3 weeks.  Please call us at [phone] if you have not heard about the biopsies in 3 weeks.    SIGNATURES/CONFIDENTIALITY: You and/or your care partner have signed paperwork which will be entered into your electronic medical record.  These signatures attest to the fact that that the information above on your After Visit Summary has been reviewed and is understood.  Full responsibility of the confidentiality of this discharge information  lies with you and/or your care-partner.

## 2016-12-05 NOTE — Op Note (Signed)
[facility] Patient Name: Heidi Rivera Procedure Date: 12/05/2016 3:13 PM MRN: [id number] Endoscopist: Ladene Artist , MD Age: 51 Referring MD:  Date of Birth: 02-11-1966 Gender: Female Account #: [id number] Procedure:                Colonoscopy Indications:              Screening for colorectal malignant neoplasm Medicines:                Monitored Anesthesia Care Procedure:                Pre-Anesthesia Assessment:                           - Prior to the procedure, a History and Physical                            was performed, and patient medications and                            allergies were reviewed. The patient's tolerance of                            previous anesthesia was also reviewed. The risks                            and benefits of the procedure and the sedation                            options and risks were discussed with the patient.                            All questions were answered, and informed consent                            was obtained. Prior Anticoagulants: The patient has                            taken no previous anticoagulant or antiplatelet                            agents. ASA Grade Assessment: II - A patient with                            mild systemic disease. After reviewing the risks                            and benefits, the patient was deemed in                            satisfactory condition to undergo the procedure.                           After obtaining informed consent, the colonoscope  was passed under direct vision. Throughout the                            procedure, the patient's blood pressure, pulse, and                            oxygen saturations were monitored continuously. The                            Colonoscope was introduced through the anus and                            advanced to the the cecum, identified by                            appendiceal  orifice and ileocecal valve. The                            ileocecal valve, appendiceal orifice, and rectum                            were photographed. The quality of the bowel                            preparation was good. The colonoscopy was performed                            without difficulty. The patient tolerated the                            procedure well. Scope In: 3:20:55 PM Scope Out: 3:35:18 PM Scope Withdrawal Time: 0 hours 11 minutes 55 seconds  Total Procedure Duration: 0 hours 14 minutes 23 seconds  Findings:                 The perianal and digital rectal examinations were                            normal.                           The entire examined colon appeared normal on direct                            and retroflexion views. Complications:            No immediate complications. Estimated blood loss:                            None. Estimated Blood Loss:     Estimated blood loss: none. Impression:               - The entire examined colon is normal on direct and                            retroflexion views.                           -  No specimens collected. Recommendation:           - Repeat colonoscopy in 10 years for screening                            purposes.                           - Patient has a contact number available for                            emergencies. The signs and symptoms of potential                            delayed complications were discussed with the                            patient. Return to normal activities tomorrow.                            Written discharge instructions were provided to the                            patient.                           - Resume previous diet.                           - Continue present medications. Ladene Artist, MD 12/05/2016 3:37:40 PM This report has been signed electronically.

## 2016-12-05 NOTE — Progress Notes (Signed)
Called to room to assist during endoscopic procedure.  Patient ID and intended procedure confirmed with present staff. Received instructions for my participation in the procedure from the performing physician.  

## 2016-12-05 NOTE — Progress Notes (Signed)
Pt's states no medical or surgical changes since previsit or office visit. 

## 2016-12-05 NOTE — Progress Notes (Signed)
To PACU VSS. Report to Opal Sidles, Therapist, sports.

## 2016-12-08 ENCOUNTER — Telehealth: Payer: Self-pay

## 2016-12-08 NOTE — Telephone Encounter (Signed)
  Follow up Call-  Call back number 12/05/2016  Post procedure Call Back phone  # [phone]  Permission to leave phone message Yes  Some recent data might be hidden     Patient questions:  Do you have a fever, pain , or abdominal swelling? No. Pain Score  0 *  Have you tolerated food without any problems? Yes.    Have you been able to return to your normal activities? Yes.    Do you have any questions about your discharge instructions: Diet   No. Medications  No. Follow up visit  No.  Do you have questions or concerns about your Care? Yes.  Pt. Reports that since Sat. Evening she has been experiencing numbness in her lower lip and wondered about that.  I told pt. Her anesthesia was given intraveneously, and perhaps it was unrelated to her procedure, given the time she noticed it.  Told pt. I would document her report.  Actions: * If pain score is 4 or above: No action needed, pain <4.

## 2016-12-15 ENCOUNTER — Other Ambulatory Visit: Payer: Self-pay

## 2016-12-15 MED ORDER — BIS SUBCIT-METRONID-TETRACYC 140-125-125 MG PO CAPS: 3.0000 | ORAL_CAPSULE | Freq: Four times a day (QID) | ORAL | 0 refills | Status: DC

## 2016-12-15 MED ORDER — OMEPRAZOLE 40 MG PO CPDR: 40.0000 mg | DELAYED_RELEASE_CAPSULE | Freq: Two times a day (BID) | ORAL | 0 refills | Status: DC

## 2017-01-28 ENCOUNTER — Emergency Department (HOSPITAL_COMMUNITY)
Admission: EM | Admit: 2017-01-28 | Discharge: 2017-01-28 | Disposition: A | Discharge status: Home / Self Care | Payer: Self-pay | Attending: Emergency Medicine | Admitting: Emergency Medicine

## 2017-01-28 ENCOUNTER — Encounter (HOSPITAL_COMMUNITY): Payer: Self-pay | Admitting: Emergency Medicine

## 2017-01-28 ENCOUNTER — Emergency Department (HOSPITAL_COMMUNITY): Payer: Self-pay

## 2017-01-28 DIAGNOSIS — M1990 Unspecified osteoarthritis, unspecified site: Secondary | ICD-10-CM | POA: Insufficient documentation

## 2017-01-28 DIAGNOSIS — I10 Essential (primary) hypertension: Secondary | ICD-10-CM | POA: Insufficient documentation

## 2017-01-28 DIAGNOSIS — M25562 Pain in left knee: Secondary | ICD-10-CM | POA: Insufficient documentation

## 2017-01-28 DIAGNOSIS — Z79899 Other long term (current) drug therapy: Secondary | ICD-10-CM | POA: Insufficient documentation

## 2017-01-28 MED ORDER — ACETAMINOPHEN 500 MG PO TABS
1000.0000 mg | ORAL_TABLET | Freq: Once | ORAL | Status: AC
  Administered 2017-01-28: 1000 mg via ORAL
  Filled 2017-01-28: qty 2

## 2017-01-28 MED ORDER — LIDOCAINE 5 % EX PTCH: 1.0000 | MEDICATED_PATCH | CUTANEOUS | 0 refills | Status: DC

## 2017-01-28 MED ORDER — LIDOCAINE 5 % EX PTCH
1.0000 | MEDICATED_PATCH | CUTANEOUS | Status: DC
  Administered 2017-01-28: 1 via TRANSDERMAL
  Filled 2017-01-28: qty 1

## 2017-01-28 NOTE — ED Triage Notes (Signed)
Pt to ED c/o L knee pain - reports that for the past month, her L knee has been swollen and increasingly painful, movement makes it worse, but it throbs all the time. Pt states she knows she has arthritis and gout, and denies any specific injury, but she's been taking every measure at home (OTC medication, anti-inflammatories, ice) and hasn't had any relief. Pt able to bear weight, but states it's getting harder each day. Pt states the pain now radiates from the top of knee down her L leg. She also states she has tightness behind her knee. Pt states she is on her feet all day at work. No redness noted. Pt denies SOB/CP.

## 2017-01-28 NOTE — ED Notes (Signed)
PT states understanding of care given, follow up care, and medication prescribed. PT ambulated from ED to car with a steady gait. 

## 2017-01-29 NOTE — ED Provider Notes (Signed)
[facility] DEPT Provider Note   CSN: [id number] Arrival date & time: 01/28/17  0122     History   Chief Complaint Chief Complaint  Patient presents with  . Knee Pain    HPI Heidi Rivera is a 51 y.o. female.  HPI   51yo female presents with concern for left knee pain. Reports she has history of arthritis and gout.  It has been progressively worsening over the last month, more painful and swollen.  Reports she has been taking anti-inflammatories, using ice, roll-on "NoPain" without relief. Also has been wearing knee sleeve. It is worse with walking, movements but is constantly painful. No falls or injury. No fevers.No dyspnea or chest pain.    Past Medical History:  Diagnosis Date  . Acute gastritis   . Gout   . Hypertension   . Migraines   . Obesity   . Vertigo     Patient Active Problem List   Diagnosis Date Noted  . Rotator cuff injury 06/04/2011  . MIGRAINE WITH AURA 07/11/2008  . CHRONIC KIDNEY DISEASE STAGE III (MODERATE) 07/11/2008  . ESSENTIAL HYPERTENSION, BENIGN 08/30/2007  . OBESITY, NOS 07/23/2006    Past Surgical History:  Procedure Laterality Date  . HEMORRHOID SURGERY      OB History    No data available       Home Medications    Prior to Admission medications   Medication Sig Start Date End Date Taking? Authorizing Provider  albuterol (PROVENTIL HFA;VENTOLIN HFA) 108 (90 BASE) MCG/ACT inhaler Inhale 2 puffs into the lungs every 4 (four) hours as needed for wheezing or shortness of breath. 10/27/14   Marylene Land, NP  bismuth-metronidazole-tetracycline [facility]) [phone] MG capsule Take 3 capsules by mouth 4 (four) times daily. 12/15/16 12/29/16  Ladene Artist, MD  famotidine (PEPCID AC) 10 MG chewable tablet Chew 10 mg by mouth 2 (two) times daily as needed for heartburn.    [provider]  hydrochlorothiazide (HYDRODIURIL) 25 MG tablet Take 1 tablet (25 mg total) by mouth daily. 03/24/13   Lind Covert, MD    HYDROcodone-acetaminophen (NORCO/VICODIN) 5-325 MG tablet Take 1 tablet by mouth every 8 hours as needed for pain. 10/23/16   Esterwood, Amy S, PA-C  lidocaine (LIDODERM) 5 % Place 1 patch onto the skin daily. Remove & Discard patch within 12 hours or as directed by MD 01/28/17   Gareth Morgan, MD  omeprazole (PRILOSEC) 40 MG capsule Take 1 capsule (40 mg total) by mouth 2 (two) times daily. 12/15/16 12/29/16  Ladene Artist, MD  sucralfate (CARAFATE) 1 GM/10ML suspension Take 10 mLs (1 g total) by mouth 4 (four) times daily -  with meals and at bedtime. 07/11/15 10/23/16  Carmin Muskrat, MD  sulindac (CLINORIL) 150 MG tablet Take 1 tablet by mouth twice daily with food. 10/23/16   Esterwood, Amy S, PA-C    Family History Family History  Problem Relation Age of Onset  . Diabetes Mother   . Breast cancer Mother   . Other Mother        Larynx cancer  . Diabetes Maternal Aunt   . Hypertension Father   . Hypercholesterolemia Father   . Other Father        DDD  . Cancer - Other Paternal Grandmother        bone  . Colon cancer Maternal Uncle     Social History Social History  Substance Use Topics  . Smoking status: Never Smoker  . Smokeless tobacco: Never  Used  . Alcohol use Yes     Comment: rare     Allergies   Tramadol   Review of Systems Review of Systems  Constitutional: Negative for fever.  HENT: Negative for sore throat.   Eyes: Negative for visual disturbance.  Respiratory: Negative for cough and shortness of breath.   Cardiovascular: Negative for chest pain.  Gastrointestinal: Negative for abdominal pain.  Genitourinary: Negative for difficulty urinating.  Musculoskeletal: Positive for arthralgias. Negative for back pain and neck pain.  Skin: Negative for rash.  Neurological: Negative for syncope and headaches.     Physical Exam Updated Vital Signs BP (!) 180/120 (BP Location: Left Arm)   Pulse 78   Temp 98 F (36.7 C)   Resp 16   LMP 01/21/2017 (Exact  Date)   SpO2 98%   Physical Exam  Constitutional: She is oriented to person, place, and time. She appears well-developed and well-nourished. No distress.  HENT:  Head: Normocephalic and atraumatic.  Eyes: Conjunctivae and EOM are normal.  Neck: Normal range of motion.  Cardiovascular: Normal rate, regular rhythm and intact distal pulses.   Pulmonary/Chest: Effort normal and breath sounds normal. No respiratory distress.  Musculoskeletal: She exhibits no edema.       Left knee: She exhibits decreased range of motion (mild limitation), swelling and effusion. She exhibits no ecchymosis, no deformity, no laceration and no erythema. Tenderness (diffuse) found.       Left lower leg: She exhibits no swelling and no edema.  Neurological: She is alert and oriented to person, place, and time.  Skin: Skin is warm and dry. No rash noted. She is not diaphoretic. No erythema.  Nursing note and vitals reviewed.    ED Treatments / Results  Labs (all labs ordered are listed, but only abnormal results are displayed) Labs Reviewed - No data to display  EKG  EKG Interpretation None       Radiology Dg Knee Complete 4 Views Left  Result Date: 01/28/2017 CLINICAL DATA:  51 y/o F; left knee pain. Edema on the lateral side of the left knee for 3 weeks. EXAM: LEFT KNEE - COMPLETE 4+ VIEW COMPARISON:  01/03/2008 left knee radiographs. FINDINGS: Mild progression of osteoarthrosis of the knee. Mild medial femorotibial compartment. Tricompartmental osteophytes. Small intra-articular bodies project in the knee joint space. Spurring of tibial spines. Superior and inferior patellar enthesophytes. Moderate joint effusion. IMPRESSION: Mild progression of osteoarthrosis of the knee greatest in medial femorotibial compartment. Moderate joint effusion. No acute fracture. Electronically Signed   By: Kristine Garbe M.D.   On: 01/28/2017 03:01    Procedures Procedures (including critical care  time)  Medications Ordered in ED Medications  acetaminophen (TYLENOL) tablet 1,000 mg (1,000 mg Oral Given 01/28/17 1884)     Initial Impression / Assessment and Plan / ED Course  I have reviewed the triage vital signs and the nursing notes.  Pertinent labs & imaging results that were available during my care of the patient were reviewed by me and considered in my medical decision making (see chart for details).    51yo female with history of htn, arthritis, gout presents with concern for left knee pain.  Patient without erythema, no hx of IVDU, no fever, good range of motion of joint and have low suspicion for septic arthritis.  No history of trauma to suggest fracture or acute ligamentous injury.  No distal leg swelling, hx and exam not consistent with DVT.  No erythema, doubt gout given 1 mo  of progressive symptoms.  XR of knee shows progressive arthritis and small effusion.  Suspect arthritis related pain. Has knee sleeve, given crutches to use as needed for comfort.  Pt with allergy to tramadol. She agrees she would like to avoid narcotic pain medications. Given lidocaine patch rx.  Recommend weight loss, follow up with Orthopedic Surgery.  Patient discharged in stable condition with understanding of reasons to return.    Final Clinical Impressions(s) / ED Diagnoses   Final diagnoses:  Acute pain of left knee  Arthritis    New Prescriptions Discharge Medication List as of 01/28/2017  6:05 AM    START taking these medications   Details  lidocaine (LIDODERM) 5 % Place 1 patch onto the skin daily. Remove & Discard patch within 12 hours or as directed by MD, Starting Wed 01/28/2017, Print         Gareth Morgan, MD 01/29/17 [phone]

## 2018-09-19 ENCOUNTER — Ambulatory Visit (HOSPITAL_COMMUNITY)
Admission: EM | Admit: 2018-09-19 | Discharge: 2018-09-19 | Disposition: A | Discharge status: Home / Self Care | Payer: Self-pay | Attending: Family Medicine | Admitting: Family Medicine

## 2018-09-19 ENCOUNTER — Encounter (HOSPITAL_COMMUNITY): Payer: Self-pay | Admitting: Emergency Medicine

## 2018-09-19 DIAGNOSIS — H60391 Other infective otitis externa, right ear: Secondary | ICD-10-CM

## 2018-09-19 DIAGNOSIS — H66001 Acute suppurative otitis media without spontaneous rupture of ear drum, right ear: Secondary | ICD-10-CM

## 2018-09-19 MED ORDER — FLUCONAZOLE 150 MG PO TABS: 150.0000 mg | ORAL_TABLET | Freq: Every day | ORAL | 0 refills | Status: DC

## 2018-09-19 MED ORDER — NEOMYCIN-POLYMYXIN-HC 3.5-10000-1 OT SUSP: 4.0000 [drp] | Freq: Three times a day (TID) | OTIC | 0 refills | Status: AC

## 2018-09-19 MED ORDER — FLUTICASONE PROPIONATE 50 MCG/ACT NA SUSP: 2.0000 | Freq: Every day | NASAL | 0 refills | Status: DC

## 2018-09-19 MED ORDER — AMOXICILLIN 875 MG PO TABS: 875.0000 mg | ORAL_TABLET | Freq: Two times a day (BID) | ORAL | 0 refills | Status: DC

## 2018-09-19 NOTE — Discharge Instructions (Signed)
Start cortisporin for right outer ear canal infection. Start amoxicillin for right inner ear infection. Flonase for possible eustachian tube dysfunction. Monitor for worsening symptoms, follow up for reevaluation as needed.

## 2018-09-19 NOTE — ED Provider Notes (Signed)
[facility]    CSN: [id number] Arrival date & time: 09/19/18  1359     History   Chief Complaint Chief Complaint  Patient presents with  . Otalgia    HPI Donnamae Muilenburg is a 53 y.o. female.   53 year old female comes in with 1 week history of right ear pain but worsening last night.  She has muffled hearing, ear pain.  Denies ear drainage.  Denies rhinorrhea, nasal congestion, cough, sneezing.  Denies fever, chills, night sweats.  States pulling at her ears can help the muffled hearing.  Has also tried hydrogen peroxide without relief.  She has not used any cotton swabs.     Past Medical History:  Diagnosis Date  . Acute gastritis   . Gout   . Hypertension   . Migraines   . Obesity   . Vertigo     Patient Active Problem List   Diagnosis Date Noted  . Rotator cuff injury 06/04/2011  . MIGRAINE WITH AURA 07/11/2008  . CHRONIC KIDNEY DISEASE STAGE III (MODERATE) 07/11/2008  . ESSENTIAL HYPERTENSION, BENIGN 08/30/2007  . OBESITY, NOS 07/23/2006    Past Surgical History:  Procedure Laterality Date  . HEMORRHOID SURGERY      OB History   No obstetric history on file.      Home Medications    Prior to Admission medications   Medication Sig Start Date End Date Taking? Authorizing Provider  albuterol (PROVENTIL HFA;VENTOLIN HFA) 108 (90 BASE) MCG/ACT inhaler Inhale 2 puffs into the lungs every 4 (four) hours as needed for wheezing or shortness of breath. 10/27/14   Marylene Land, NP  amoxicillin (AMOXIL) 875 MG tablet Take 1 tablet (875 mg total) by mouth 2 (two) times daily. 09/19/18   Tasia Catchings, Erian Rosengren V, PA-C  fluconazole (DIFLUCAN) 150 MG tablet Take 1 tablet (150 mg total) by mouth daily. Take second dose 72 hours later if symptoms still persists. 09/19/18   Tasia Catchings, Lynne Righi V, PA-C  fluticasone (FLONASE) 50 MCG/ACT nasal spray Place 2 sprays into both nostrils daily. 09/19/18   Tasia Catchings, Pattye Meda V, PA-C  hydrochlorothiazide (HYDRODIURIL) 25 MG tablet Take 1 tablet (25 mg  total) by mouth daily. 03/24/13   Lind Covert, MD  neomycin-polymyxin-hydrocortisone (CORTISPORIN) 3.5-10000-1 OTIC suspension Place 4 drops into the right ear 3 (three) times daily for 7 days. 09/19/18 09/26/18  Ok Edwards, PA-C    Family History Family History  Problem Relation Age of Onset  . Diabetes Mother   . Breast cancer Mother   . Other Mother        Larynx cancer  . Diabetes Maternal Aunt   . Hypertension Father   . Hypercholesterolemia Father   . Other Father        DDD  . Cancer - Other Paternal Grandmother        bone  . Colon cancer Maternal Uncle     Social History Social History   Tobacco Use  . Smoking status: Never Smoker  . Smokeless tobacco: Never Used  Substance Use Topics  . Alcohol use: Yes    Comment: rare  . Drug use: No     Allergies   Tramadol   Review of Systems Review of Systems  Reason unable to perform ROS: See HPI as above.     Physical Exam Triage Vital Signs ED Triage Vitals  Enc Vitals Group     BP 09/19/18 1417 (!) 162/89     Pulse Rate 09/19/18 1417 62  Resp 09/19/18 1417 18     Temp 09/19/18 1419 98.4 F (36.9 C)     Temp src --      SpO2 09/19/18 1417 100 %     Weight --      Height --      Head Circumference --      Peak Flow --      Pain Score 09/19/18 1416 7     Pain Loc --      Pain Edu? --      Excl. in [place]? --    No data found.  Updated Vital Signs BP (!) 162/89   Pulse 62   Temp 98.4 F (36.9 C)   Resp 18   SpO2 100%   Physical Exam Constitutional:      General: She is not in acute distress.    Appearance: She is well-developed. She is not ill-appearing, toxic-appearing or diaphoretic.  HENT:     Head: Normocephalic and atraumatic.     Ears:     Comments: No tenderness to palpation of left tragus. No ear canal swelling, erythema. TM normal without erythema, bulging.  Tenderness to palpation of right tragus.  Ear canal opening swelling.  TM hard to visualize due to ear canal  swelling, grossly erythematous without bulging.  Eyes:     Conjunctiva/sclera: Conjunctivae normal.     Pupils: Pupils are equal, round, and reactive to light.  Neurological:     Mental Status: She is alert and oriented to person, place, and time.      UC Treatments / Results  Labs (all labs ordered are listed, but only abnormal results are displayed) Labs Reviewed - No data to display  EKG None  Radiology No results found.  Procedures Procedures (including critical care time)  Medications Ordered in UC Medications - No data to display  Initial Impression / Assessment and Plan / UC Course  I have reviewed the triage vital signs and the nursing notes.  Pertinent labs & imaging results that were available during my care of the patient were reviewed by me and considered in my medical decision making (see chart for details).    Cortisporin for otitis externa.  Ear wick placed given significant erythema swelling.  Start amoxicillin for otitis media.  Flonase for possible eustachian tube dysfunction.  Return precautions given.  Patient expresses understanding and agrees to plan.  Final Clinical Impressions(s) / UC Diagnoses   Final diagnoses:  Non-recurrent acute suppurative otitis media of right ear without spontaneous rupture of tympanic membrane  Infective otitis externa of right ear   ED Prescriptions    Medication Sig Dispense Auth. Provider   amoxicillin (AMOXIL) 875 MG tablet Take 1 tablet (875 mg total) by mouth 2 (two) times daily. 14 tablet Hilma Steinhilber V, PA-C   fluconazole (DIFLUCAN) 150 MG tablet Take 1 tablet (150 mg total) by mouth daily. Take second dose 72 hours later if symptoms still persists. 2 tablet Marcelene Weidemann V, PA-C   neomycin-polymyxin-hydrocortisone (CORTISPORIN) 3.5-10000-1 OTIC suspension Place 4 drops into the right ear 3 (three) times daily for 7 days. 4.2 mL Trueman Worlds V, PA-C   fluticasone (FLONASE) 50 MCG/ACT nasal spray Place 2 sprays into both nostrils  daily. 1 g Tobin Chad, [place] 09/19/18 1455

## 2018-09-19 NOTE — ED Triage Notes (Signed)
Pt states her R ear has been bothering her for the last week, states its painful and feels muffled.

## 2018-12-24 ENCOUNTER — Other Ambulatory Visit: Payer: Self-pay

## 2018-12-24 ENCOUNTER — Ambulatory Visit (HOSPITAL_COMMUNITY)
Admission: EM | Admit: 2018-12-24 | Discharge: 2018-12-24 | Disposition: A | Discharge status: Home / Self Care | Payer: HRSA Program | Attending: Emergency Medicine | Admitting: Emergency Medicine

## 2018-12-24 ENCOUNTER — Encounter (HOSPITAL_COMMUNITY): Payer: Self-pay | Admitting: Emergency Medicine

## 2018-12-24 DIAGNOSIS — Z20828 Contact with and (suspected) exposure to other viral communicable diseases: Secondary | ICD-10-CM | POA: Diagnosis not present

## 2018-12-24 DIAGNOSIS — Z20822 Contact with and (suspected) exposure to covid-19: Secondary | ICD-10-CM

## 2018-12-24 DIAGNOSIS — R05 Cough: Secondary | ICD-10-CM | POA: Diagnosis present

## 2018-12-24 DIAGNOSIS — R059 Cough, unspecified: Secondary | ICD-10-CM

## 2018-12-24 DIAGNOSIS — U071 COVID-19: Secondary | ICD-10-CM | POA: Insufficient documentation

## 2018-12-24 DIAGNOSIS — R51 Headache: Secondary | ICD-10-CM | POA: Diagnosis not present

## 2018-12-24 DIAGNOSIS — R0981 Nasal congestion: Secondary | ICD-10-CM

## 2018-12-24 NOTE — Discharge Instructions (Addendum)
Your COVID test is pending.  You should self quarantine until your test result is back and is negative.    You can take over-the-counter Tylenol and Mucinex as needed for your symptoms.    Go to the emergency department if you develop difficulty breathing, shortness of breath, high fever, severe diarrhea, or other concerning symptoms.

## 2018-12-24 NOTE — ED Provider Notes (Signed)
[place]    CSN: [id number] Arrival date & time: 12/24/18  1717     History   Chief Complaint Chief Complaint  Patient presents with  . Cough    HPI Heidi Rivera is a 53 y.o. female.   Patient presents with 3-day history of nonproductive cough, nasal congestion, and generalized headache.  She denies fever, chills, shortness of breath, abdominal pain, vomiting, diarrhea, rash, or other symptoms.  She states her symptoms improve with over-the-counter Tylenol.  She denies known COVID exposures.    The history is provided by the patient.    Past Medical History:  Diagnosis Date  . Acute gastritis   . Gout   . Hypertension   . Migraines   . Obesity   . Vertigo     Patient Active Problem List   Diagnosis Date Noted  . Rotator cuff injury 06/04/2011  . MIGRAINE WITH AURA 07/11/2008  . CHRONIC KIDNEY DISEASE STAGE III (MODERATE) 07/11/2008  . ESSENTIAL HYPERTENSION, BENIGN 08/30/2007  . OBESITY, NOS 07/23/2006    Past Surgical History:  Procedure Laterality Date  . HEMORRHOID SURGERY      OB History   No obstetric history on file.      Home Medications    Prior to Admission medications   Medication Sig Start Date End Date Taking? Authorizing Provider  albuterol (PROVENTIL HFA;VENTOLIN HFA) 108 (90 BASE) MCG/ACT inhaler Inhale 2 puffs into the lungs every 4 (four) hours as needed for wheezing or shortness of breath. 10/27/14   Marylene Land, NP  amoxicillin (AMOXIL) 875 MG tablet Take 1 tablet (875 mg total) by mouth 2 (two) times daily. Patient not taking: Reported on 12/24/2018 09/19/18   Ok Edwards, PA-C  fluconazole (DIFLUCAN) 150 MG tablet Take 1 tablet (150 mg total) by mouth daily. Take second dose 72 hours later if symptoms still persists. Patient not taking: Reported on 12/24/2018 09/19/18   Ok Edwards, PA-C  fluticasone [facility]) 50 MCG/ACT nasal spray Place 2 sprays into both nostrils daily. 09/19/18   Tasia Catchings, Amy V, PA-C   hydrochlorothiazide (HYDRODIURIL) 25 MG tablet Take 1 tablet (25 mg total) by mouth daily. 03/24/13   Lind Covert, MD    Family History Family History  Problem Relation Age of Onset  . Diabetes Mother   . Breast cancer Mother   . Other Mother        Larynx cancer  . Diabetes Maternal Aunt   . Hypertension Father   . Hypercholesterolemia Father   . Other Father        DDD  . Cancer - Other Paternal Grandmother        bone  . Colon cancer Maternal Uncle     Social History Social History   Tobacco Use  . Smoking status: Never Smoker  . Smokeless tobacco: Never Used  Substance Use Topics  . Alcohol use: Yes    Comment: rare  . Drug use: No     Allergies   Tramadol   Review of Systems Review of Systems  Constitutional: Negative for chills and fever.  HENT: Positive for congestion. Negative for ear pain, rhinorrhea and sore throat.   Eyes: Negative for pain and visual disturbance.  Respiratory: Positive for cough. Negative for shortness of breath.   Cardiovascular: Negative for chest pain and palpitations.  Gastrointestinal: Negative for abdominal pain and vomiting.  Genitourinary: Negative for dysuria and hematuria.  Musculoskeletal: Negative for arthralgias and back pain.  Skin: Negative for color  change and rash.  Neurological: Positive for headaches. Negative for seizures and syncope.  All other systems reviewed and are negative.    Physical Exam Triage Vital Signs ED Triage Vitals  Enc Vitals Group     BP      Pulse      Resp      Temp      Temp src      SpO2      Weight      Height      Head Circumference      Peak Flow      Pain Score      Pain Loc      Pain Edu?      Excl. in [place]?    No data found.  Updated Vital Signs BP (!) 171/93 (BP Location: Right Arm)   Pulse 76   Temp 98.2 F (36.8 C) (Temporal)   Resp 18   SpO2 95%   Visual Acuity Right Eye Distance:   Left Eye Distance:   Bilateral Distance:    Right Eye Near:    Left Eye Near:    Bilateral Near:     Physical Exam Vitals signs and nursing note reviewed.  Constitutional:      General: She is not in acute distress.    Appearance: She is well-developed.  HENT:     Head: Normocephalic and atraumatic.     Right Ear: Tympanic membrane and ear canal normal.     Left Ear: Tympanic membrane and ear canal normal.     Nose: Nose normal.     Mouth/Throat:     Mouth: Mucous membranes are moist.     Pharynx: Oropharynx is clear. No oropharyngeal exudate or posterior oropharyngeal erythema.  Eyes:     Conjunctiva/sclera: Conjunctivae normal.  Neck:     Musculoskeletal: Neck supple.  Cardiovascular:     Rate and Rhythm: Normal rate and regular rhythm.     Heart sounds: Normal heart sounds.  Pulmonary:     Effort: Pulmonary effort is normal. No respiratory distress.     Breath sounds: Normal breath sounds.  Abdominal:     Palpations: Abdomen is soft.     Tenderness: There is no abdominal tenderness. There is no guarding or rebound.  Skin:    General: Skin is warm and dry.     Findings: No rash.  Neurological:     Mental Status: She is alert.      UC Treatments / Results  Labs (all labs ordered are listed, but only abnormal results are displayed) Labs Reviewed  NOVEL CORONAVIRUS, NAA (HOSPITAL ORDER, SEND-OUT TO REF LAB)    EKG   Radiology No results found.  Procedures Procedures (including critical care time)  Medications Ordered in UC Medications - No data to display  Initial Impression / Assessment and Plan / UC Course  I have reviewed the triage vital signs and the nursing notes.  Pertinent labs & imaging results that were available during my care of the patient were reviewed by me and considered in my medical decision making (see chart for details).   Cough.  Suspected COVID.  COVID test performed here and pending.  Instructed patient to self quarantine until her test result is back and is negative.  Discussed with patient  that she can take over-the-counter Tylenol and Mucinex as needed for her symptoms.  Instructed patient to go to the emergency department if she develops difficulty breathing, shortness of breath, high fever, severe diarrhea,  or other symptoms.     Final Clinical Impressions(s) / UC Diagnoses   Final diagnoses:  Cough  Suspected Covid-19 Virus Infection     Discharge Instructions     Your COVID test is pending.  You should self quarantine until your test result is back and is negative.    You can take over-the-counter Tylenol and Mucinex as needed for your symptoms.    Go to the emergency department if you develop difficulty breathing, shortness of breath, high fever, severe diarrhea, or other concerning symptoms.        ED Prescriptions    None     Controlled Substance Prescriptions [place] Controlled Substance Registry consulted? Not Applicable   Sharion Balloon, NP 12/24/18 1839

## 2018-12-24 NOTE — ED Triage Notes (Signed)
Pt here for cough and congestion with HA x 3 days

## 2018-12-27 ENCOUNTER — Telehealth (HOSPITAL_COMMUNITY): Payer: Self-pay | Admitting: Emergency Medicine

## 2018-12-27 LAB — NOVEL CORONAVIRUS, NAA (HOSP ORDER, SEND-OUT TO REF LAB; TAT 18-24 HRS): SARS-CoV-2, NAA: DETECTED — AB

## 2018-12-27 NOTE — Telephone Encounter (Signed)
Your test for COVID-19 was positive, meaning that you were infected with the novel coronavirus and could give the germ to others.  Please continue isolation at home, for at least 10 days since the start of your fever/cough/breathlessness and until you have had 3 consecutive days without fever (without taking a fever reducer) and with cough/breathlessness improving. Please continue good preventive care measures, including:  frequent hand-washing, avoid touching your face, cover coughs/sneezes, stay out of crowds and keep a 6 foot distance from others.  Recheck or go to the nearest hospital ED tent for re-assessment if fever/cough/breathlessness return.  Patient contacted and made aware of    results, all questions answered Work notes sent

## 2019-04-04 ENCOUNTER — Other Ambulatory Visit: Payer: Self-pay

## 2019-04-04 DIAGNOSIS — Z20822 Contact with and (suspected) exposure to covid-19: Secondary | ICD-10-CM

## 2019-04-04 NOTE — Addendum Note (Signed)
Addended by: Rolinda Roan on: 04/04/2019 03:35 PM   Modules accepted: Orders

## 2019-04-11 ENCOUNTER — Other Ambulatory Visit: Payer: Self-pay

## 2019-04-11 DIAGNOSIS — Z20822 Contact with and (suspected) exposure to covid-19: Secondary | ICD-10-CM

## 2019-04-13 LAB — NOVEL CORONAVIRUS, NAA: SARS-CoV-2, NAA: NOT DETECTED

## 2019-06-15 ENCOUNTER — Other Ambulatory Visit: Payer: Self-pay

## 2019-06-15 ENCOUNTER — Encounter (HOSPITAL_COMMUNITY): Payer: Self-pay

## 2019-06-15 ENCOUNTER — Ambulatory Visit (HOSPITAL_COMMUNITY)
Admission: EM | Admit: 2019-06-15 | Discharge: 2019-06-15 | Disposition: A | Discharge status: Home / Self Care | Payer: Managed Care, Other (non HMO) | Source: Home / Self Care

## 2019-06-15 ENCOUNTER — Emergency Department (HOSPITAL_COMMUNITY)
Admission: EM | Admit: 2019-06-15 | Discharge: 2019-06-15 | Disposition: A | Discharge status: Home / Self Care | Payer: Managed Care, Other (non HMO) | Attending: Emergency Medicine | Admitting: Emergency Medicine

## 2019-06-15 ENCOUNTER — Emergency Department (HOSPITAL_COMMUNITY): Payer: Managed Care, Other (non HMO)

## 2019-06-15 DIAGNOSIS — R7989 Other specified abnormal findings of blood chemistry: Secondary | ICD-10-CM | POA: Insufficient documentation

## 2019-06-15 DIAGNOSIS — I998 Other disorder of circulatory system: Secondary | ICD-10-CM | POA: Diagnosis not present

## 2019-06-15 DIAGNOSIS — I200 Unstable angina: Secondary | ICD-10-CM

## 2019-06-15 DIAGNOSIS — N183 Chronic kidney disease, stage 3 unspecified: Secondary | ICD-10-CM | POA: Diagnosis not present

## 2019-06-15 DIAGNOSIS — M542 Cervicalgia: Secondary | ICD-10-CM | POA: Diagnosis not present

## 2019-06-15 DIAGNOSIS — R0789 Other chest pain: Secondary | ICD-10-CM | POA: Diagnosis present

## 2019-06-15 DIAGNOSIS — I129 Hypertensive chronic kidney disease with stage 1 through stage 4 chronic kidney disease, or unspecified chronic kidney disease: Secondary | ICD-10-CM | POA: Insufficient documentation

## 2019-06-15 DIAGNOSIS — I161 Hypertensive emergency: Secondary | ICD-10-CM

## 2019-06-15 DIAGNOSIS — N289 Disorder of kidney and ureter, unspecified: Secondary | ICD-10-CM | POA: Diagnosis not present

## 2019-06-15 LAB — CBC
HCT: 39.4 % (ref 36.0–46.0)
Hemoglobin: 12.5 g/dL (ref 12.0–15.0)
MCH: 27.7 pg (ref 26.0–34.0)
MCHC: 31.7 g/dL (ref 30.0–36.0)
MCV: 87.2 fL (ref 80.0–100.0)
Platelets: 205 10*3/uL (ref 150–400)
RBC: 4.52 MIL/uL (ref 3.87–5.11)
RDW: 13 % (ref 11.5–15.5)
WBC: 7.3 10*3/uL (ref 4.0–10.5)
nRBC: 0 % (ref 0.0–0.2)

## 2019-06-15 LAB — BASIC METABOLIC PANEL
Anion gap: 8 (ref 5–15)
BUN: 16 mg/dL (ref 6–20)
CO2: 25 mmol/L (ref 22–32)
Calcium: 8.6 mg/dL — ABNORMAL LOW (ref 8.9–10.3)
Chloride: 105 mmol/L (ref 98–111)
Creatinine, Ser: 2.21 mg/dL — ABNORMAL HIGH (ref 0.44–1.00)
GFR calc Af Amer: 28 mL/min — ABNORMAL LOW (ref >60)
GFR calc non Af Amer: 24 mL/min — ABNORMAL LOW (ref >60)
Glucose, Bld: 92 mg/dL (ref 70–99)
Potassium: 3.6 mmol/L (ref 3.5–5.1)
Sodium: 138 mmol/L (ref 135–145)

## 2019-06-15 LAB — I-STAT BETA HCG BLOOD, ED (MC, WL, AP ONLY): I-stat hCG, quantitative: <5 m[IU]/mL (ref <5)

## 2019-06-15 LAB — CBG MONITORING, ED: Glucose-Capillary: 81 mg/dL (ref 70–99)

## 2019-06-15 LAB — TROPONIN I (HIGH SENSITIVITY)
Troponin I (High Sensitivity): 5 ng/L (ref <18)
Troponin I (High Sensitivity): 7 ng/L (ref <18)

## 2019-06-15 MED ORDER — ASPIRIN 81 MG PO CHEW
CHEWABLE_TABLET | ORAL | Status: AC
  Filled 2019-06-15: qty 4

## 2019-06-15 MED ORDER — SODIUM CHLORIDE 0.9 % IV SOLN: Freq: Once | INTRAVENOUS | Status: DC

## 2019-06-15 MED ORDER — LIDOCAINE VISCOUS HCL 2 % MT SOLN: 15.0000 mL | Freq: Once | OROMUCOSAL | Status: DC

## 2019-06-15 MED ORDER — ASPIRIN 81 MG PO CHEW
324.0000 mg | CHEWABLE_TABLET | Freq: Once | ORAL | Status: AC
  Administered 2019-06-15: 324 mg via ORAL
  Filled 2019-06-15: qty 4

## 2019-06-15 MED ORDER — NITROGLYCERIN 0.4 MG SL SUBL
SUBLINGUAL_TABLET | SUBLINGUAL | Status: AC
  Filled 2019-06-15: qty 1

## 2019-06-15 MED ORDER — NITROGLYCERIN 0.4 MG SL SUBL
0.4000 mg | SUBLINGUAL_TABLET | SUBLINGUAL | Status: DC | PRN
  Administered 2019-06-15: 0.4 mg via SUBLINGUAL

## 2019-06-15 MED ORDER — ALUM & MAG HYDROXIDE-SIMETH 200-200-20 MG/5ML PO SUSP: 30.0000 mL | Freq: Once | ORAL | Status: DC

## 2019-06-15 MED ORDER — ASPIRIN 81 MG PO CHEW
324.0000 mg | CHEWABLE_TABLET | Freq: Once | ORAL | Status: AC
  Administered 2019-06-15: 19:00:00 324 mg via ORAL

## 2019-06-15 MED ORDER — ONDANSETRON 4 MG PO TBDP
4.0000 mg | ORAL_TABLET | Freq: Once | ORAL | Status: AC
  Administered 2019-06-15: 4 mg via ORAL
  Filled 2019-06-15: qty 1

## 2019-06-15 MED ORDER — HYDROCODONE-ACETAMINOPHEN 5-325 MG PO TABS
1.0000 | ORAL_TABLET | Freq: Once | ORAL | Status: AC
  Administered 2019-06-15: 1 via ORAL
  Filled 2019-06-15: qty 1

## 2019-06-15 NOTE — ED Triage Notes (Signed)
Pt c/o right neck/shoulder/arm pain and right sided chest pain, radiating to upper right back area x1 week. States left arm feels "tingly" today. Denies SOB, nausea, dizziness, slurred speech or sweating with sx.  Pt states pain to right arm/shoulder increased with massage.  Pain to upper back and chest increases with palpitation.

## 2019-06-15 NOTE — ED Notes (Signed)
Patient placed on O2 2% Tensed per Jaynee Eagles PA

## 2019-06-15 NOTE — ED Provider Notes (Signed)
[place]   MRN: [id number] DOB: August 30, 1965  Subjective:   Heidi Rivera is a 54 y.o. female presenting for 1 week history of progressively worsening mid-sternal chest pain that radiates to her back intermittently. Currently rates her chest pain at a 7/10. Patient had a very difficult time with her chest pain last night and became very worried so presented today. Has tried GasX without relief. Has also had right arm numbness and tingling now having this in her left hand. Has right sided neck pain. Denies hx of CAD, MI.   No current facility-administered medications for this encounter.  Current Outpatient Medications:  .  hydrochlorothiazide (HYDRODIURIL) 25 MG tablet, Take 1 tablet (25 mg total) by mouth daily., Disp: 30 tablet, Rfl: 0 .  albuterol (PROVENTIL HFA;VENTOLIN HFA) 108 (90 BASE) MCG/ACT inhaler, Inhale 2 puffs into the lungs every 4 (four) hours as needed for wheezing or shortness of breath., Disp: 1 Inhaler, Rfl: 0   Allergies  Allergen Reactions  . Tramadol Other (See Comments)    Intolerance : GI Upset    Past Medical History:  Diagnosis Date  . Acute gastritis   . Gout   . Hypertension   . Migraines   . Obesity   . Vertigo      Past Surgical History:  Procedure Laterality Date  . HEMORRHOID SURGERY      Family History  Problem Relation Age of Onset  . Diabetes Mother   . Breast cancer Mother   . Other Mother        Larynx cancer  . Diabetes Maternal Aunt   . Hypertension Father   . Hypercholesterolemia Father   . Other Father        DDD  . Cancer - Other Paternal Grandmother        bone  . Colon cancer Maternal Uncle     Social History   Tobacco Use  . Smoking status: Never Smoker  . Smokeless tobacco: Never Used  Substance Use Topics  . Alcohol use: Yes    Comment: rare  . Drug use: No    ROS   Objective:   Vitals: BP (!) 188/100 (BP Location: Right Arm)   Pulse (!) 54   Temp 97.8 F (36.6 C) (Oral)   Resp  20   LMP 06/01/2019   SpO2 100%   BP Readings from Last 3 Encounters:  06/15/19 (!) 188/100  12/24/18 (!) 171/93  09/19/18 (!) 162/89   Physical Exam Constitutional:      General: She is not in acute distress.    Appearance: Normal appearance. She is well-developed. She is obese. She is not ill-appearing, toxic-appearing or diaphoretic.  HENT:     Head: Normocephalic and atraumatic.     Nose: Nose normal.     Mouth/Throat:     Mouth: Mucous membranes are moist.  Eyes:     Extraocular Movements: Extraocular movements intact.     Pupils: Pupils are equal, round, and reactive to light.  Neck:     Vascular: No carotid bruit.  Cardiovascular:     Rate and Rhythm: Regular rhythm. Bradycardia present.     Pulses: Normal pulses.     Heart sounds: Normal heart sounds. No murmur. No friction rub. No gallop.   Pulmonary:     Effort: Pulmonary effort is normal. No respiratory distress.     Breath sounds: Normal breath sounds. No stridor. No wheezing, rhonchi or rales.  Abdominal:     General: Bowel sounds are  normal. There is no distension.     Palpations: Abdomen is soft. There is no mass.     Tenderness: There is abdominal tenderness (generalized left sided). There is no guarding or rebound.  Skin:    General: Skin is warm and dry.     Findings: No rash.  Neurological:     Mental Status: She is alert and oriented to person, place, and time.     Cranial Nerves: No cranial nerve deficit.     Motor: No weakness.     Coordination: Coordination normal.     Deep Tendon Reflexes: Reflexes normal.  Psychiatric:        Mood and Affect: Mood normal.        Behavior: Behavior normal.        Thought Content: Thought content normal.        Judgment: Judgment normal.     ED ECG REPORT   Date: 06/15/2019  Rate: 52bpm  Rhythm: sinus bradycardia  QRS Axis: normal  Intervals: normal  ST/T Wave abnormalities: nonspecific T wave changes  Conduction Disutrbances:none  Narrative  Interpretation: Sinus bradycardia at 52bpm with T wave flattening at III, aVF, V2, comparable to previous ECG on 10/17/2016.  Old EKG Reviewed: unchanged  I have personally reviewed the EKG tracing and agree with the computerized printout as noted.   Assessment and Plan :   1. Unstable angina ([place])   2. Hypertensive emergency   3. Morbid obesity ([place])     Patient was given 324 mg chewable aspirin, 2 doses of sublingual nitroglycerin, was placed on 2 L of oxygen.  Chest pain improved to a 6 out of 10 from a 7 out of 10 at triage.  Patient was transported to [facility] for ACS, possible NSTEMI, hypertensive emergency.   Jaynee Eagles, [place] 06/15/19 1845

## 2019-06-15 NOTE — Discharge Instructions (Signed)
You have been diagnosed by your caregiver as having chest wall pain. °SEEK IMMEDIATE MEDICAL ATTENTION IF: °You develop a fever.  °Your chest pains become severe or intolerable.  °You develop new, unexplained symptoms (problems).  °You develop shortness of breath, nausea, vomiting, sweating or feel light headed.  °You develop a new cough or you cough up blood. ° °

## 2019-06-15 NOTE — ED Provider Notes (Signed)
[facility] EMERGENCY DEPARTMENT Provider Note   CSN: [id number] Arrival date & time: 06/15/19  1903     History No chief complaint on file.   Heidi Rivera is a 54 y.o. female who presents to the ED with a cc of CP. She was seen in the UC earlier and sent in for questionable UA vs hypertensive emergency. She reports 3 weeks of constant, aching, progressively worsening central chest pain. Better with movement and worse with rest. Radiates to her back. She has taken Gas X without relief. She was given NTG and asa at the UC and states that she has had NO improvement in her sxs. She noticed pain in the back of her neck and also had some R hand paresthesia while side-lying in  Bed. She had the same in her left hand today and came in for evaluation.  HPI  HPI: A 54 year old patient with a history of hypertension and obesity presents for evaluation of chest pain. Initial onset of pain was more than 6 hours ago. The patient's chest pain is described as heaviness/pressure/tightness and is not worse with exertion. The patient's chest pain is middle- or left-sided, is not well-localized, is not sharp and does radiate to the arms/jaw/neck. The patient does not complain of nausea and denies diaphoresis. The patient has no history of stroke, has no history of peripheral artery disease, has not smoked in the past 90 days, denies any history of treated diabetes, has no relevant family history of coronary artery disease (first degree relative at less than age 79) and has no history of hypercholesterolemia.   Past Medical History:  Diagnosis Date  . Acute gastritis   . Gout   . Hypertension   . Migraines   . Obesity   . Vertigo     Patient Active Problem List   Diagnosis Date Noted  . Rotator cuff injury 06/04/2011  . MIGRAINE WITH AURA 07/11/2008  . CHRONIC KIDNEY DISEASE STAGE III (MODERATE) 07/11/2008  . ESSENTIAL HYPERTENSION, BENIGN 08/30/2007  . OBESITY, NOS  07/23/2006    Past Surgical History:  Procedure Laterality Date  . HEMORRHOID SURGERY       OB History   No obstetric history on file.     Family History  Problem Relation Age of Onset  . Diabetes Mother   . Breast cancer Mother   . Other Mother        Larynx cancer  . Diabetes Maternal Aunt   . Hypertension Father   . Hypercholesterolemia Father   . Other Father        DDD  . Cancer - Other Paternal Grandmother        bone  . Colon cancer Maternal Uncle     Social History   Tobacco Use  . Smoking status: Never Smoker  . Smokeless tobacco: Never Used  Substance Use Topics  . Alcohol use: Yes    Comment: rare  . Drug use: No    Home Medications Prior to Admission medications   Medication Sig Start Date End Date Taking? Authorizing Provider  albuterol (PROVENTIL HFA;VENTOLIN HFA) 108 (90 BASE) MCG/ACT inhaler Inhale 2 puffs into the lungs every 4 (four) hours as needed for wheezing or shortness of breath. 10/27/14   Marylene Land, NP  hydrochlorothiazide (HYDRODIURIL) 25 MG tablet Take 1 tablet (25 mg total) by mouth daily. 03/24/13   Lind Covert, MD  fluticasone (FLONASE) 50 MCG/ACT nasal spray Place 2 sprays into both nostrils daily. 09/19/18 06/15/19  Tasia Catchings, Amy V, PA-C    Allergies    Tramadol  Review of Systems   Review of Systems Ten systems reviewed and are negative for acute change, except as noted in the HPI.   Physical Exam Updated Vital Signs BP (!) 177/90 (BP Location: Left Arm)   Pulse (!) 50   Temp 97.7 F (36.5 C) (Oral)   Resp 14   Ht 5\' 9"  (1.753 m)   Wt (!) 140.6 kg   LMP 06/01/2019   SpO2 100%   BMI 45.78 kg/m   Physical Exam Vitals and nursing note reviewed.  Constitutional:      General: She is not in acute distress.    Appearance: She is well-developed. She is not diaphoretic.  HENT:     Head: Normocephalic and atraumatic.  Eyes:     General: No scleral icterus.    Conjunctiva/sclera: Conjunctivae normal.    Cardiovascular:     Rate and Rhythm: Normal rate and regular rhythm.     Heart sounds: Normal heart sounds. No murmur. No friction rub. No gallop.   Pulmonary:     Effort: Pulmonary effort is normal. No respiratory distress.     Breath sounds: Normal breath sounds.  Chest:     Chest wall: Tenderness present.    Abdominal:     General: Bowel sounds are normal. There is no distension.     Palpations: Abdomen is soft. There is no mass.     Tenderness: There is no abdominal tenderness. There is no guarding.  Musculoskeletal:     Cervical back: Normal range of motion.  Skin:    General: Skin is warm and dry.  Neurological:     Mental Status: She is alert and oriented to person, place, and time.  Psychiatric:        Behavior: Behavior normal.     ED Results / Procedures / Treatments   Labs (all labs ordered are listed, but only abnormal results are displayed) Labs Reviewed  CBC  BASIC METABOLIC PANEL  CBG MONITORING, ED  I-STAT BETA HCG BLOOD, ED ([facility], [facility], [facility] ONLY)  TROPONIN I (HIGH SENSITIVITY)    EKG EKG Interpretation  Date/Time:  Wednesday June 15 2019 19:13:53 EST Ventricular Rate:  53 PR Interval:    QRS Duration: 98 QT Interval:  530 QTC Calculation: 498 R Axis:   36 Text Interpretation: Sinus rhythm Borderline T wave abnormalities Borderline prolonged QT interval No significant change since last tracing Confirmed by Blanchie Dessert [phone]) on 06/15/2019 7:19:18 PM   Radiology DG Chest Portable 1 View  Result Date: 06/15/2019 CLINICAL DATA:  Chest pain. EXAM: PORTABLE CHEST 1 VIEW COMPARISON:  Oct 16, 2016 FINDINGS: The heart size and mediastinal contours are within normal limits. Both lungs are clear. The visualized skeletal structures are unremarkable. IMPRESSION: No active disease. Electronically Signed   By: Virgina Norfolk M.D.   On: 06/15/2019 19:57    Procedures Procedures (including critical care time)  Medications Ordered in ED Medications   aspirin chewable tablet 324 mg (324 mg Oral Given 06/15/19 1930)    ED Course  I have reviewed the triage vital signs and the nursing notes.  Pertinent labs & imaging results that were available during my care of the patient were reviewed by me and considered in my medical decision making (see chart for details).  Clinical Course as of Jun 14 2310  Wed Jun 15, 2019  2057 8:58 PM Cardiac monitoring reveals sinus bradycardia at a rate of 52,  as reviewed and interpreted by me. Cardiac monitoring was ordered due to chest pain and to monitor patient for dysrhythmia.     [AH]    Clinical Course User Index [AH] Margarita Mail, PA-C   MDM Rules/Calculators/A&P HEAR Score: 3                   Given the large differential diagnosis for Heidi Rivera, the decision making in this case is of high complexity.  Reviewed the patient's labs which shows 2 - troponins over 2 hours.  CBG within normal limits.  CBC without abnormality.  Negative pregnancy test.  Of note the patient's serum creatinine is at 2.21.  Last value 2 to 3 years ago was 1.8.  I reviewed these findings with the Dr. Maryan Rued and patient.  This is very likely chronic renal insufficiency secondary to poorly controlled hypertension.  Do not feel that the patient needs to be admitted for this tonight.  I have discussed the case with the family medicine residents who have set up a return appointment for the patient in 2 days to recheck her serum creatinine level and address her blood pressures.  I have also discussed the patient chest wall pain issues.  I do not feel there is an emergent value and she may follow-up with [place] in the outpatient setting.  EKG shows sinus bradycardia unchanged from previous tracings.  I have reviewed the patient's chest x-ray which shows no acute abnormalities my interpretation  After evaluating all of the data points in this case, the presentation of Heidi Rivera is NOT consistent with Acute  Coronary Syndrome (ACS) and/or myocardial ischemia, pulmonary embolism, aortic dissection; Borhaave's, significant arrythmia, pneumothorax, cardiac tamponade, or other emergent cardiopulmonary condition.  Further, the presentation of Heidi Rivera is NOT consistent with pericarditis, myocarditis, cholecystitis, pancreatitis, mediastinitis, endocarditis, new valvular disease.  Additionally, the presentation of Heidi Rivera NOT consistent with flail chest, cardiac contusion, ARDS, or significant intra-thoracic or intra-abdominal bleeding.  Moreover, this presentation is NOT consistent with pneumonia, sepsis, or pyelonephritis.    Strict return and follow-up precautions have been given by me personally or by detailed written instruction given verbally by nursing staff using the teach back method to the patient/family/caregiver(s).  Data Reviewed/Counseling: I have reviewed the patient's vital signs, nursing notes, and other relevant tests/information. I had a detailed discussion regarding the historical points, exam findings, and any diagnostic results supporting the discharge diagnosis. I also discussed the need for outpatient follow-up and the need to return to the ED if symptoms worsen or if there are any questions or concerns that arise at home.  Final Clinical Impression(s) / ED Diagnoses Final diagnoses:  None    Rx / DC Orders ED Discharge Orders    None       Margarita Mail, PA-C 06/15/19 2320    Blanchie Dessert, MD 06/15/19 2329

## 2019-06-15 NOTE — ED Notes (Signed)
EMS at bedside

## 2019-06-15 NOTE — ED Triage Notes (Signed)
Came in with [facility], from urgent care complaining chest pain that radiates to the left arm, reportedly had for a week on and off but got worse last night. Right now rates the pain at 6/10, had aspirin and 2 doses of nitroglycerin without relief. Currently BP of 177/90. 99% at RA, bradycardic at 45 BPM.

## 2019-06-17 ENCOUNTER — Ambulatory Visit: Payer: Managed Care, Other (non HMO) | Admitting: Family Medicine

## 2019-06-17 ENCOUNTER — Other Ambulatory Visit: Payer: Self-pay

## 2019-06-17 VITALS — BP 160/98 | HR 98 | Wt 304.8 lb

## 2019-06-17 DIAGNOSIS — N3946 Mixed incontinence: Secondary | ICD-10-CM | POA: Insufficient documentation

## 2019-06-17 DIAGNOSIS — N183 Chronic kidney disease, stage 3 unspecified: Secondary | ICD-10-CM | POA: Diagnosis not present

## 2019-06-17 DIAGNOSIS — M940 Chondrocostal junction syndrome [Tietze]: Secondary | ICD-10-CM

## 2019-06-17 DIAGNOSIS — G43009 Migraine without aura, not intractable, without status migrainosus: Secondary | ICD-10-CM | POA: Diagnosis not present

## 2019-06-17 DIAGNOSIS — I10 Essential (primary) hypertension: Secondary | ICD-10-CM

## 2019-06-17 DIAGNOSIS — N393 Stress incontinence (female) (male): Secondary | ICD-10-CM

## 2019-06-17 HISTORY — DX: Stress incontinence (female) (male): N39.3

## 2019-06-17 HISTORY — DX: Chondrocostal junction syndrome (tietze): M94.0

## 2019-06-17 MED ORDER — BLOOD PRESSURE CUFF MISC: 1.0000 | Freq: Every day | 0 refills | Status: AC

## 2019-06-17 MED ORDER — AMLODIPINE BESYLATE 10 MG PO TABS: 10.0000 mg | ORAL_TABLET | Freq: Every day | ORAL | 3 refills | Status: DC

## 2019-06-17 MED ORDER — SUMATRIPTAN SUCCINATE 50 MG PO TABS: 50.0000 mg | ORAL_TABLET | ORAL | 0 refills | Status: DC | PRN

## 2019-06-17 MED ORDER — BLOOD PRESSURE CUFF MISC: 1.0000 | Freq: Every day | 0 refills | Status: DC

## 2019-06-17 MED ORDER — TOPIRAMATE 25 MG PO TABS: 25.0000 mg | ORAL_TABLET | Freq: Every day | ORAL | 0 refills | Status: DC

## 2019-06-17 NOTE — Assessment & Plan Note (Signed)
Frequent migraine headaches becoming unresponsive to OTC therapies. She has also been advised to avoid Excedrin due to worsening kidney function. Given frequency and chronicity, will trial preventative medicine. She has history of bradycardia thus will avoid betablocker. Will trial Topiramate 25mg  QD. For breakthrough migraines, will trial Sumatriptan 50mg  PRN. Discussed instructions for use of each medicine. Patient voiced understanding and agreement with plan. Will plan to follow up in 2 weeks to see how she is tolerating new medicines.

## 2019-06-17 NOTE — Assessment & Plan Note (Signed)
Recheck BMP today. Recommended to avoid NSAIDs including Excedrin.

## 2019-06-17 NOTE — Assessment & Plan Note (Signed)
Uncontrolled. History of taking HCTZ 25mg  QD but has not been taking for some time. Given kidney function will avoid ACE/ARB and diuretic. Will start Amlodipine 10mg  qHS. Recommended patient purchase blood pressure cuff, check blood pressure daily, and keep a log. Will plan to follow up in 2 weeks to monitor for efficacy. Will consider additional medicine if still remains uncontrolled. BMP ordered today to monitor kidney function.

## 2019-06-17 NOTE — Patient Instructions (Addendum)
It was so great to see you today.  I obtained repeat labs to check your kidneys. I have also starting you on a new blood pressure medicine called Amlodipine that you should take once a day at night before bed.  Please purchase a blood pressure cuff. Check blood pressure daily and keep a log.  Please try Voltaren Gel and Icey-Hot for your chest pain. I believe you may costochondritis which is inflammation of the rib cage which can make you feel like you have chest pain.  You can try heat or ice to help as well. Try to avoid heavy lifting as this will exacerbate the pain.  We can try to use pepcid AC if no improvement.  Topiramate - use daily to PREVENT migraines.  Sumatriptan - use AS NEEDED for a breakthrough migraine

## 2019-06-17 NOTE — Progress Notes (Signed)
Subjective:   Patient ID: Heidi Rivera    DOB: [birth year], 54 y.o. female   MRN: [id number]  Heidi Rivera is a 54 y.o. female with a history of HTN and obesity here for hospital follow up.  Hospital follow up  Chest Pain  Kidney Disease: Presented to ED on 06/15/2019 with chief complaint of chest pain. She reports 3 weeks of constant, aching, progressively worsening central chest pain. Better with movement and worse with rest. Radiates to her back. She has taken Gas X without relief. She was given NTG and aspirin at urgent care which did not improve her symptoms. Work up at ED was unremarkable with two negative troponins, CBG and CBC WNL. EKG with sinus bradycardia unchanged from previous tracings. Chest x-ray with no acute abnormalities. She was noted to have elevated creatinine of 2.21 with baseline level of 1.8, 2-3 years ago. This was felt to be new baselline for patient. She has poorly controlled HTN with BP consistently elevated >160/90. She has taken HCTZ in the past but has been out of this medicine. Notes chest pain at this time on right mid sternal border that goes to back. Denies any change with food. Laying down makes it worse. Denies sour taste or burping.   Hypertension: Blood pressure elevated to 160/98. Has been on HCTZ 25mg  in the past but has not been on anything in a while. Has chronic migraines but denies any vision changes.   History of Migraines without Aura: Patient notes history of migraines without aura. She has been getting them more frequently lately. She notes about 9 days out of the month that she has a migraine. Has used all of the OTC medicines and only finds Excedrin to be helpful, however this is starting not to help anymore. She was also advised not to take Excedrin any more due to her kidneys. However she notes it was so bad yesterday she had to take 3 to improve it.    Stress incontinence: Leakage of fluid when she laughs or sneezes. Denies  dysuria.   Review of Systems:  Per HPI.   [place], medications and smoking status reviewed.  Objective:   BP (!) 160/98   Pulse 98   Wt (!) 304 lb 12.8 oz (138.3 kg)   LMP 06/01/2019   SpO2 (!) 82%   BMI 45.01 kg/m  Vitals and nursing note reviewed.  General: pleasant AA female, well nourished, well developed, in no acute distress with non-toxic appearance, sitting comfortably in exam chair HEENT: normocephalic, atraumatic CV: regular rate and rhythm without murmurs, rubs, or gallops Lungs: clear to auscultation bilaterally with normal work of breathing Skin: warm, dry MSK: chest wall tenderness at right mid sternal border to palpation  Assessment & Plan:   Migraine without aura Frequent migraine headaches becoming unresponsive to OTC therapies. She has also been advised to avoid Excedrin due to worsening kidney function. Given frequency and chronicity, will trial preventative medicine. She has history of bradycardia thus will avoid betablocker. Will trial Topiramate 25mg  QD. For breakthrough migraines, will trial Sumatriptan 50mg  PRN. Discussed instructions for use of each medicine. Patient voiced understanding and agreement with plan. Will plan to follow up in 2 weeks to see how she is tolerating new medicines.   ESSENTIAL HYPERTENSION, BENIGN Uncontrolled. History of taking HCTZ 25mg  QD but has not been taking for some time. Given kidney function will avoid ACE/ARB and diuretic. Will start Amlodipine 10mg  qHS. Recommended patient purchase blood pressure cuff, check blood pressure  daily, and keep a log. Will plan to follow up in 2 weeks to monitor for efficacy. Will consider additional medicine if still remains uncontrolled. BMP ordered today to monitor kidney function.   Stress incontinence Symptoms appear most consistent with stress incontinence. Pelvic exam deferred at this time. Discussed pelvic floor exercises. Patient was eager to try this for treatment plan. Instructions for  different pelvic floor exercises discussed. Will continue to monitor. If becomes more bothersome, can consider pelvic exam to evaluate for prolapse and consider pessary vs urology referral.  CHRONIC KIDNEY DISEASE STAGE III (MODERATE) Recheck BMP today. Recommended to avoid NSAIDs including Excedrin.   Costochondritis, acute Chest pain reproducible on exam making costochondritis highest on differential. Reassured that more alarming etiologies have been ruled out given negative work up in ED. GERD is possible but does not quite fit overall picture. Recommended Voltaren gel, Icey-Hot, and/or Tylenol PRN for chest wall pain. [name] as needed. Patient to avoid NSAIDs. If no improvement can trial Pepcid to evaluate for GERD etiology.   Orders Placed This Encounter  Procedures  . Basic Metabolic Panel   Meds ordered this encounter  Medications  . DISCONTD: amLODipine (NORVASC) 10 MG tablet    Sig: Take 1 tablet (10 mg total) by mouth at bedtime.    Dispense:  90 tablet    Refill:  3  . DISCONTD: Blood Pressure Monitoring (BLOOD PRESSURE CUFF) MISC    Sig: 1 Device by Does not apply route daily.    Dispense:  1 each    Refill:  0  . amLODipine (NORVASC) 10 MG tablet    Sig: Take 1 tablet (10 mg total) by mouth at bedtime.    Dispense:  90 tablet    Refill:  3  . Blood Pressure Monitoring (BLOOD PRESSURE CUFF) MISC    Sig: 1 Device by Does not apply route daily.    Dispense:  1 each    Refill:  0  . topiramate (TOPAMAX) 25 MG tablet    Sig: Take 1 tablet (25 mg total) by mouth daily.    Dispense:  30 tablet    Refill:  0  . SUMAtriptan (IMITREX) 50 MG tablet    Sig: Take 1 tablet (50 mg total) by mouth every 2 (two) hours as needed for migraine. May repeat in 2 hours if headache persists or recurs.    Dispense:  10 tablet    Refill:  0    Mina Marble, DO PGY-2, [place] [facility] 06/17/2019 9:08 PM

## 2019-06-17 NOTE — Assessment & Plan Note (Signed)
Symptoms appear most consistent with stress incontinence. Pelvic exam deferred at this time. Discussed pelvic floor exercises. Patient was eager to try this for treatment plan. Instructions for different pelvic floor exercises discussed. Will continue to monitor. If becomes more bothersome, can consider pelvic exam to evaluate for prolapse and consider pessary vs urology referral.

## 2019-06-17 NOTE — Assessment & Plan Note (Signed)
Chest pain reproducible on exam making costochondritis highest on differential. Reassured that more alarming etiologies have been ruled out given negative work up in ED. GERD is possible but does not quite fit overall picture. Recommended Voltaren gel, Icey-Hot, and/or Tylenol PRN for chest wall pain. [name] as needed. Patient to avoid NSAIDs. If no improvement can trial Pepcid to evaluate for GERD etiology.

## 2019-06-18 LAB — BASIC METABOLIC PANEL
BUN/Creatinine Ratio: 8 — ABNORMAL LOW (ref 9–23)
BUN: 21 mg/dL (ref 6–24)
CO2: 26 mmol/L (ref 20–29)
Calcium: 9.2 mg/dL (ref 8.7–10.2)
Chloride: 105 mmol/L (ref 96–106)
Creatinine, Ser: 2.48 mg/dL — ABNORMAL HIGH (ref 0.57–1.00)
GFR calc Af Amer: 25 mL/min/{1.73_m2} — ABNORMAL LOW (ref >59)
GFR calc non Af Amer: 21 mL/min/{1.73_m2} — ABNORMAL LOW (ref >59)
Glucose: 76 mg/dL (ref 65–99)
Potassium: 4.2 mmol/L (ref 3.5–5.2)
Sodium: 144 mmol/L (ref 134–144)

## 2019-06-20 ENCOUNTER — Ambulatory Visit: Payer: Managed Care, Other (non HMO) | Attending: Internal Medicine

## 2019-06-20 DIAGNOSIS — Z20822 Contact with and (suspected) exposure to covid-19: Secondary | ICD-10-CM

## 2019-06-21 LAB — NOVEL CORONAVIRUS, NAA: SARS-CoV-2, NAA: NOT DETECTED

## 2019-06-22 ENCOUNTER — Other Ambulatory Visit: Payer: Self-pay | Admitting: Family Medicine

## 2019-06-22 DIAGNOSIS — G43009 Migraine without aura, not intractable, without status migrainosus: Secondary | ICD-10-CM

## 2019-06-30 ENCOUNTER — Encounter: Payer: Self-pay | Admitting: Family Medicine

## 2019-06-30 ENCOUNTER — Ambulatory Visit (INDEPENDENT_AMBULATORY_CARE_PROVIDER_SITE_OTHER): Payer: Managed Care, Other (non HMO) | Admitting: Family Medicine

## 2019-06-30 ENCOUNTER — Other Ambulatory Visit: Payer: Self-pay

## 2019-06-30 ENCOUNTER — Telehealth: Payer: Self-pay | Admitting: *Deleted

## 2019-06-30 VITALS — BP 168/88 | HR 82 | Wt 298.8 lb

## 2019-06-30 DIAGNOSIS — M940 Chondrocostal junction syndrome [Tietze]: Secondary | ICD-10-CM

## 2019-06-30 DIAGNOSIS — G43009 Migraine without aura, not intractable, without status migrainosus: Secondary | ICD-10-CM

## 2019-06-30 DIAGNOSIS — N184 Chronic kidney disease, stage 4 (severe): Secondary | ICD-10-CM | POA: Diagnosis not present

## 2019-06-30 DIAGNOSIS — I10 Essential (primary) hypertension: Secondary | ICD-10-CM | POA: Diagnosis not present

## 2019-06-30 DIAGNOSIS — N1832 Chronic kidney disease, stage 3b: Secondary | ICD-10-CM | POA: Diagnosis not present

## 2019-06-30 MED ORDER — LOSARTAN POTASSIUM 25 MG PO TABS: 25.0000 mg | ORAL_TABLET | Freq: Every day | ORAL | 3 refills | Status: DC

## 2019-06-30 MED ORDER — MAGNESIUM OXIDE 420 MG PO TABS: 1.0000 | ORAL_TABLET | Freq: Every day | ORAL | 3 refills | Status: DC

## 2019-06-30 NOTE — Progress Notes (Signed)
Subjective:   Patient ID: [name]    DOB: 14-Sep-1965, 54 y.o. female   MRN: [id number]  Heidi Rivera is a 54 y.o. female here for blood pressure and migraine follow up  Blood Pressure: Patient started on amlodipine 10mg  QD during last visit for elevated BP. She was instructed to keep a log at home. She notes her blood pressures average from 170-175/80-90. She endorses compliance with her blood pressure medicine. Denies any chest pain (other than her chest wall pain and right harm numbness that has been worked up). She does have migraines and frequent headaches. No LE edema.  Migraines: Patient was started on Topomax 50mg  for prevention of migraines. She notes when she was taking it she noted she kept getting onset of a migraine about 30 minutes after taking it. This happened several times so she stopped taking the medicine all together. She took the medicine 4 days in a row without any improvement. Patient was also started on Sumatriptan for acute attacks. She was taking 50mg  which helped the headaches, but would need to take another in about 8-9 hours later. She has had two migraines in the last 2 weeks. Symptoms include pounding headache with sensitivity to light, some lightheadedness, spots in her eyes. Denies any thunderclap headache. Denies weakness or LOC. Denies using Excedrin.   Review of Systems:  Per HPI.   [place], medications and smoking status reviewed.  Objective:   BP (!) 168/88   Pulse 82   Wt 298 lb 12.8 oz (135.5 kg)   LMP 06/01/2019   SpO2 98%   BMI 44.13 kg/m  Vitals and nursing note reviewed.  General: pleasant older AA woman, sitting comfortably in exam chair, in no acute distress with non-toxic appearance Resp: breathing comfortably on room air, speaking in full sentences Skin: warm, dry, no rashes or lesions MSK:  gait normal Neuro: Alert and oriented, speech normal  Assessment & Plan:   Migraine without aura Refractor to Topomax.  Did have relief with Sumatriptan PRN. Suspect that her elevated blood pressures are contributing to her headaches and migraine frequency. Will discontinue topomax. Will focus on lowering blood pressure. Did recommend OTC 400mg  Mag Oxide which has been shown to help with migraines. Sumatriptan for acute attacks PRN. RTC in 1 week for follow up.   ESSENTIAL HYPERTENSION, BENIGN Uncontrolled. Currently on Amlodipine 10mg  QD. BMP obtained and notable for slight improvement in kidney function. Will start Losartan 25mg  QD as this can also be kidney protective. Patient to keep a log of home BP readings. Appointment on 2/8 for blood pressure follow up. Will obtain repeat BMP then to continue to monitor kidney function. Suspect will need to go up on Losartan if kidney function remains tolerable. Consider secondary HTN work up if remains persistently elevated.  CHRONIC KIDNEY DISEASE STAGE III (MODERATE) Borderline CKD IIIb-IV. Some improvement in kidney function however. Suspect due to discontinuation of Excedrin. Will refer to Nephrology to establish care due to severity. Will continue to monitor closely. Obtain repeat BMP in 1 week to monitor while on Losartan.    Costochondritis, acute Continues to endorse constant right middle chest pain that radiates to her back and right arm numbness. Worse when she lays flat. Minimal improvement with Voltaren gel. Also tried Pepcid just in case it was GERD contributing. Unlikely cardiac given negative work up in ED. Did have tenderness to palpation with palpation. Cervical pathology is possible. Consider repeat 2-view chest x-ray as her last x-ray was portable. Recommended  she discuss this at her new patient appointment on 2/12 as it deserves adequate time to investigate given multiple acute concerns today. She understood and agreed to plan.   Orders Placed This Encounter  Procedures  . Basic Metabolic Panel  . Ambulatory referral to Nephrology    Referral Priority:    Routine    Referral Type:   Consultation    Referral Reason:   Specialty Services Required    Requested Specialty:   Nephrology    Number of Visits Requested:   1   Meds ordered this encounter  Medications  . losartan (COZAAR) 25 MG tablet    Sig: Take 1 tablet (25 mg total) by mouth at bedtime.    Dispense:  90 tablet    Refill:  3  . Magnesium Oxide 420 MG TABS    Sig: Take 1 tablet (420 mg total) by mouth daily.    Dispense:  30 tablet    Refill:  [place], DO PGY-2, [facility] 07/01/2019 1:22 PM

## 2019-06-30 NOTE — Telephone Encounter (Signed)
Received fax from pharmacy, PA needed on sumatriptan.  Clinical questions submitted via Cover My Meds.  Waiting on response, could take up to 72 hours.  Cover My Meds info: Key: [id number]  Christen Bame, CMA

## 2019-06-30 NOTE — Patient Instructions (Addendum)
Start Magnesium Oxide 400mg  (this is over the counter): 1 pill a day. This is to help prevent migraines. Do NOT take Excedrin  Please continue Amlodipine 10mg . Please start taking Losartan 25mg .   I am gettting labs today to monitor kidney function. I am also referring you to a kidney doctor. Please expect a call from them to schedule.

## 2019-07-01 LAB — BASIC METABOLIC PANEL
BUN/Creatinine Ratio: 9 (ref 9–23)
BUN: 18 mg/dL (ref 6–24)
CO2: 24 mmol/L (ref 20–29)
Calcium: 9.7 mg/dL (ref 8.7–10.2)
Chloride: 104 mmol/L (ref 96–106)
Creatinine, Ser: 2.09 mg/dL — ABNORMAL HIGH (ref 0.57–1.00)
GFR calc Af Amer: 30 mL/min/{1.73_m2} — ABNORMAL LOW (ref >59)
GFR calc non Af Amer: 26 mL/min/{1.73_m2} — ABNORMAL LOW (ref >59)
Glucose: 88 mg/dL (ref 65–99)
Potassium: 4.1 mmol/L (ref 3.5–5.2)
Sodium: 142 mmol/L (ref 134–144)

## 2019-07-01 NOTE — Assessment & Plan Note (Signed)
Borderline CKD IIIb-IV. Some improvement in kidney function however. Suspect due to discontinuation of Excedrin. Will refer to Nephrology to establish care due to severity. Will continue to monitor closely. Obtain repeat BMP in 1 week to monitor while on Losartan.

## 2019-07-01 NOTE — Assessment & Plan Note (Addendum)
Continues to endorse constant right middle chest pain that radiates to her back and right arm numbness. Worse when she lays flat. Minimal improvement with Voltaren gel. Also tried Pepcid just in case it was GERD contributing. Unlikely cardiac given negative work up in ED. Did have tenderness to palpation with palpation. Cervical pathology is possible. Consider repeat 2-view chest x-ray as her last x-ray was portable. Recommended she discuss this at her new patient appointment on 2/12 as it deserves adequate time to investigate given multiple acute concerns today. She understood and agreed to plan.

## 2019-07-01 NOTE — Assessment & Plan Note (Signed)
Refractor to Topomax. Did have relief with Sumatriptan PRN. Suspect that her elevated blood pressures are contributing to her headaches and migraine frequency. Will discontinue topomax. Will focus on lowering blood pressure. Did recommend OTC 400mg  Mag Oxide which has been shown to help with migraines. Sumatriptan for acute attacks PRN. RTC in 1 week for follow up.

## 2019-07-01 NOTE — Assessment & Plan Note (Addendum)
Uncontrolled. Currently on Amlodipine 10mg  QD. BMP obtained and notable for slight improvement in kidney function. Will start Losartan 25mg  QD as this can also be kidney protective. Patient to keep a log of home BP readings. Appointment on 2/8 for blood pressure follow up. Will obtain repeat BMP then to continue to monitor kidney function. Suspect will need to go up on Losartan if kidney function remains tolerable. Consider secondary HTN work up if remains persistently elevated.

## 2019-07-05 ENCOUNTER — Other Ambulatory Visit: Payer: Self-pay

## 2019-07-05 ENCOUNTER — Ambulatory Visit (INDEPENDENT_AMBULATORY_CARE_PROVIDER_SITE_OTHER): Payer: Managed Care, Other (non HMO) | Admitting: Family Medicine

## 2019-07-05 VITALS — BP 170/110 | HR 68 | Wt 302.6 lb

## 2019-07-05 DIAGNOSIS — I10 Essential (primary) hypertension: Secondary | ICD-10-CM | POA: Diagnosis not present

## 2019-07-05 DIAGNOSIS — G43009 Migraine without aura, not intractable, without status migrainosus: Secondary | ICD-10-CM

## 2019-07-05 MED ORDER — LOSARTAN POTASSIUM 50 MG PO TABS: 25.0000 mg | ORAL_TABLET | Freq: Every day | ORAL | 2 refills | Status: DC

## 2019-07-05 MED ORDER — SUMATRIPTAN SUCCINATE 50 MG PO TABS: ORAL_TABLET | ORAL | 2 refills | Status: DC

## 2019-07-05 MED ORDER — AMITRIPTYLINE HCL 10 MG PO TABS: 10.0000 mg | ORAL_TABLET | Freq: Every day | ORAL | 2 refills | Status: DC

## 2019-07-05 NOTE — Progress Notes (Addendum)
   CHIEF COMPLAINT / HPI:  Migraine headache follow up 4 headaches in the past week, about the same as usual Last all day, mixed with tension Migraines are throbbing pain with phonophobia or photophobia with nausea Reports blurred vision with headaches Takes sumatriptan 50 mg for abortive therapy and uses it sparingly Felt like topiramate made headaches worse and has stopped taking this Has been using magnesium oxide and peppermint oil as well Feels fatigued and is currently in school, unable to do homework during the past week  Hypertension Was recently started on losartan 25 mg nightly and amlodipine 10 mg nightly and has been taking these as prescribed, tolerating well Denies blurry vision Referral placed last week to nephrology given CKD, BMP obtained at that time as well  PERTINENT  PMH / PSH: migraine without aura, hypertension, CKD stage 3   OBJECTIVE: BP (!) 170/110   Pulse 68   Wt (!) 302 lb 9.6 oz (137.3 kg)   LMP 06/29/2019   SpO2 99%   BMI 44.69 kg/m   General: Appears fatigued but overall well, obese, pleasant Cardiac: RRR, no MRG Respiratory: CTA B  ASSESSMENT / PLAN:  Migraine without aura Since patient could not tolerate topiramate and her pulse is on the lower end of normal so a beta-blocker is contraindicated, we will try amitriptyline 10 mg nightly for migraine prophylaxis.  This medication can help with her tension headaches as well.  We will start with the lowest dose given her CKD, although this is not a renally dosed medication.  Advised her that we can increase this dose gradually as needed, with the highest dose being 150 mg nightly.  Reviewed how to take sumatriptan, with the ability to take a second dose after 2 hours if needed, and refill this medication.  School note provided excusing her from assignments for the past week due to her migraines.  ESSENTIAL HYPERTENSION, BENIGN Blood pressure significantly elevated today.  Increase losartan to 50 mg  nightly and will continue amlodipine 10 mg nightly.  Given recent initiation of losartan and her concurrent CKD, will obtain a BMP today.  She will return within one week for a nurse visit BP check.     Kathrene Alu, MD [place]

## 2019-07-05 NOTE — Patient Instructions (Signed)
It was nice meeting you today Ms. Peter Congo!  For your headaches, I am refilling your sumatriptan, and we are starting amitriptyline nightly at the lowest dose available.  We may need to increase this dose slowly over time, so please let us know if you are still experiencing significant headaches after about a week.  For your blood pressure, I am increasing her losartan to 50 mg once per day and I am checking a blood count to make sure that your electrolytes and creatinine are looking okay.  Please give a couple more weeks before checking back with [place] about your nephrology referral.  I would like for you to make a nurse appointment for a blood pressure check in about a week.  If you have any questions or concerns, please feel free to call the clinic.   Be well,  Dr. Shan Levans

## 2019-07-06 LAB — BASIC METABOLIC PANEL
BUN/Creatinine Ratio: 8 — ABNORMAL LOW (ref 9–23)
BUN: 16 mg/dL (ref 6–24)
CO2: 22 mmol/L (ref 20–29)
Calcium: 9.2 mg/dL (ref 8.7–10.2)
Chloride: 103 mmol/L (ref 96–106)
Creatinine, Ser: 2.04 mg/dL — ABNORMAL HIGH (ref 0.57–1.00)
GFR calc Af Amer: 31 mL/min/{1.73_m2} — ABNORMAL LOW (ref >59)
GFR calc non Af Amer: 27 mL/min/{1.73_m2} — ABNORMAL LOW (ref >59)
Glucose: 77 mg/dL (ref 65–99)
Potassium: 3.9 mmol/L (ref 3.5–5.2)
Sodium: 140 mmol/L (ref 134–144)

## 2019-07-06 NOTE — Telephone Encounter (Signed)
Medication denied.  More info will be in the reference letter. Christen Bame, CMA

## 2019-07-06 NOTE — Assessment & Plan Note (Addendum)
Since patient could not tolerate topiramate and her pulse is on the lower end of normal so a beta-blocker is contraindicated, we will try amitriptyline 10 mg nightly for migraine prophylaxis.  This medication can help with her tension headaches as well.  We will start with the lowest dose given her CKD, although this is not a renally dosed medication.  Advised her that we can increase this dose gradually as needed, with the highest dose being 150 mg nightly.  Reviewed how to take sumatriptan, with the ability to take a second dose after 2 hours if needed, and refill this medication.  School note provided excusing her from assignments for the past week due to her migraines.

## 2019-07-06 NOTE — Assessment & Plan Note (Addendum)
Blood pressure significantly elevated today.  Increase losartan to 50 mg nightly and will continue amlodipine 10 mg nightly.  Given recent initiation of losartan and her concurrent CKD, will obtain a BMP today.  She will return within one week for a nurse visit BP check.

## 2019-07-13 ENCOUNTER — Other Ambulatory Visit: Payer: Self-pay

## 2019-07-13 ENCOUNTER — Encounter: Payer: Self-pay | Admitting: Family Medicine

## 2019-07-13 ENCOUNTER — Ambulatory Visit (INDEPENDENT_AMBULATORY_CARE_PROVIDER_SITE_OTHER): Payer: Managed Care, Other (non HMO) | Admitting: Family Medicine

## 2019-07-13 DIAGNOSIS — G43009 Migraine without aura, not intractable, without status migrainosus: Secondary | ICD-10-CM

## 2019-07-13 DIAGNOSIS — I10 Essential (primary) hypertension: Secondary | ICD-10-CM

## 2019-07-13 DIAGNOSIS — N1832 Chronic kidney disease, stage 3b: Secondary | ICD-10-CM | POA: Diagnosis not present

## 2019-07-13 DIAGNOSIS — Z0000 Encounter for general adult medical examination without abnormal findings: Secondary | ICD-10-CM

## 2019-07-13 DIAGNOSIS — Z6841 Body Mass Index (BMI) 40.0 and over, adult: Secondary | ICD-10-CM

## 2019-07-13 MED ORDER — SUMATRIPTAN SUCCINATE 50 MG PO TABS: ORAL_TABLET | ORAL | 2 refills | Status: DC

## 2019-07-13 NOTE — Assessment & Plan Note (Signed)
Continue amitriptyline 10mg  daily for prophalaxis  and Sumatriptan.  Encouraged patient to take amitriptyline  - Refilled Sumatriptan

## 2019-07-13 NOTE — Patient Instructions (Addendum)
It was great seeing you today!   I'd like to see you back for your PAP smear  but if you need to be seen earlier than that for any new issues we're happy to fit you in, just give [place] a call!   - Schedule your appointment on the way out for your PAP smear  - We will get some blood work at your next visit - Call Dr. Jenne Campus, registered dietician, regarding your nutrition at [phone].     If you have questions or concerns please do not hesitate to call at [phone].  Dr. Rushie [facility]

## 2019-07-13 NOTE — Assessment & Plan Note (Signed)
BP at home SBP range 170 -120s  / DBP 70s-105.  Today 148/86; still not at goal. Home regimen includes amlodipine 10 mg and Losartan 50 mg.  Suspect elevated BP's during migraine episodes.  - Continue Norvasc and Cozaar  - BMP with elevated Cr and GFR 31.  - Obtain lipid panel and a1c at next visit.  Patient declined labs today.

## 2019-07-13 NOTE — Progress Notes (Addendum)
New Patient Office Visit  Subjective:  Patient ID: Heidi Rivera, female    DOB: Sep 14, 1965  Age: 54 y.o. MRN: [id number]  CC:  Chief Complaint  Patient presents with  . Hypertension    HPI Heidi Rivera presents for to establish care.   Patient did not previously follow with a PCP for more than 5 years.    New Patient : PMHx: HTN, CKD, migraines, gout, vertigo, obesity     Meds: reviewed and updated as needed    ALL: Tramadol (itchy and rash), no known enviornmental allergies    SurgHx: hemorrhoid surgery   GYNHx: LMP 07/02/19. Occurs monthly.  [name], two sons and a daughter.      FMHx:  Family History  Problem Relation Age of Onset  . Diabetes Mother   . Breast Rivera Mother   . Other Mother        Heidi Rivera  . Diabetes Maternal Aunt   . Hypertension Father   . Hypercholesterolemia Father   . Other Father        DDD  . Rivera - Other Paternal Grandmother        bone  . Colon Rivera Maternal Uncle       Social Hx:  Lives with grandkids.  Walks for exercise but regularly.  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 Mena denies smoking and illicit drug use.  Rarely drinks alcohol. Likes to read, visit with family and friends and eat out.       ROS Review of Systems  Constitutional: Negative for fever.  HENT: Negative for congestion and sore throat.   Respiratory: Negative for cough and shortness of breath.   Cardiovascular: Negative for chest pain.       Chest wall pain on the right side   Gastrointestinal: Negative for abdominal pain, blood in stool, constipation, diarrhea, nausea and vomiting.  Genitourinary: Negative for dysuria.  Musculoskeletal: Positive for back pain (lower left). Negative for arthralgias, myalgias and neck pain.  Skin: Negative for wound.  Neurological: Positive for headaches.       No focal weakness  Psychiatric/Behavioral: Negative for dysphoric mood.    Objective:   Today's Vitals: BP (!)  148/85   Pulse 84   Wt 300 lb (136.1 kg)   LMP 06/29/2019   SpO2 99%   BMI 44.30 kg/m   Physical Exam Vitals reviewed.  Constitutional:      General: She is not in acute distress.    Appearance: She is obese.  HENT:     Head: Normocephalic and atraumatic.     Right Ear: Tympanic membrane and external ear normal.     Left Ear: Tympanic membrane and external ear normal.     Nose: Nose normal.     Mouth/Throat:     Mouth: Mucous membranes are moist.     Pharynx: Oropharynx is clear. No oropharyngeal exudate or posterior oropharyngeal erythema.  Eyes:     General: No scleral icterus.    Extraocular Movements: Extraocular movements intact.     Conjunctiva/sclera: Conjunctivae normal.     Pupils: Pupils are equal, round, and reactive to light.  Cardiovascular:     Rate and Rhythm: Normal rate and regular rhythm.     Pulses: Normal pulses.     Heart sounds: Normal heart sounds. No murmur.  Pulmonary:     Effort: Pulmonary effort is normal. No respiratory distress.     Breath sounds: Normal breath sounds.  Abdominal:  General: Bowel sounds are normal.     Palpations: Abdomen is soft. There is no mass.     Tenderness: There is no abdominal tenderness.  Musculoskeletal:        General: Normal range of motion.     Cervical back: Normal range of motion and neck supple. No rigidity.     Right lower leg: No edema.     Left lower leg: No edema.  Skin:    General: Skin is warm and dry.     Capillary Refill: Capillary refill takes less than 2 seconds.  Neurological:     General: No focal deficit present.     Mental Status: She is alert and oriented to person, place, and time.     Comments: Strength 5/5 UE and LE, gross sensation intact   Psychiatric:        Mood and Affect: Mood normal.        Behavior: Behavior normal.        Thought Content: Thought content normal.        Judgment: Judgment normal.     Assessment & Plan:   Problem List Items Addressed This Visit       Cardiovascular and Mediastinum   Migraine without aura    Continue amitriptyline 10mg  daily for prophalaxis  and Sumatriptan.  Encouraged patient to take amitriptyline  - Refilled Sumatriptan         Relevant Medications   SUMAtriptan (IMITREX) 50 MG tablet   ESSENTIAL HYPERTENSION, BENIGN    BP at home SBP range 170 -120s  / DBP 70s-105.  Today 148/86; still not at goal. Home regimen includes amlodipine 10 mg and Losartan 50 mg.  Suspect elevated BP's during migraine episodes.  - Continue Norvasc and Cozaar  - BMP with elevated Cr and GFR 31.  - Obtain lipid panel and a1c at next visit.  Patient declined labs today.         Genitourinary   CHRONIC KIDNEY DISEASE STAGE III (MODERATE)    Reviewed recent lab work with patient.  GFR 31 (CKD Stage 3B). Patient states she was never told there was any kidney function abnormality until recently.  GFR <60 since 2011; gradually declining.   Patient has upcoming appointment with nephrologist next week.  Discussed need to keep this appointment. Patient agrees.   - Continue HTN medications (see above).  - Repeat BMP at next visit (patient declined labs today)        Other   OBESITY, NOS    Patient has started making some changes to her diet.  Would like to speak with a dietician regarding how best to use food to manage weight and hypertension.  BMI 44.3  - Referral information given to Dr. Jenne Campus (RD).  Patient to call Dr. Jenne Campus to see if she is available.   - Will continue to discuss weight interventions at next visit       Healthcare maintenance    Patient to schedule mammogram and PAP smear.  Would like to get Tetanus here but declines influenza vaccine.  - Patient to schedule PAP  - Mammogram referral placed  - Colonoscopy UTD, due 2028 - Patient would like to get Td booster at next visit when she gets labs       Relevant Orders   MM DIGITAL SCREENING BILATERAL      Outpatient Encounter Medications as of 07/13/2019  Medication Sig    . albuterol (PROVENTIL HFA;VENTOLIN HFA) 108 (90 BASE) MCG/ACT inhaler Inhale 2  puffs into the lungs every 4 (four) hours as needed for wheezing or shortness of breath.  [name] amitriptyline (ELAVIL) 10 MG tablet Take 1 tablet (10 mg total) by mouth at bedtime.  [name] amLODipine (NORVASC) 10 MG tablet Take 1 tablet (10 mg total) by mouth at bedtime.  . Blood Pressure Monitoring (BLOOD PRESSURE CUFF) MISC 1 Device by Does not apply route daily.  [name] losartan (COZAAR) 50 MG tablet Take 0.5 tablets (25 mg total) by mouth at bedtime.  . Magnesium Oxide 420 MG TABS Take 1 tablet (420 mg total) by mouth daily.  . SUMAtriptan (IMITREX) 50 MG tablet TAKE 1 TABLET BY MOUTH EVERY 2 HOURS AS NEEDED FOR MIGRAINE. MAY REPEAT IN 2 HOURS IF HEADACHE PERSISTS OR RECURS.  . [DISCONTINUED] fluticasone (FLONASE) 50 MCG/ACT nasal spray Place 2 sprays into both nostrils daily.   No facility-administered encounter medications on file as of 07/13/2019.    Follow-up: Schedule PAP    [name], DO

## 2019-07-14 ENCOUNTER — Ambulatory Visit: Payer: Self-pay | Admitting: Family Medicine

## 2019-07-15 DIAGNOSIS — Z0000 Encounter for general adult medical examination without abnormal findings: Secondary | ICD-10-CM | POA: Insufficient documentation

## 2019-07-15 NOTE — Assessment & Plan Note (Signed)
Patient to schedule mammogram and PAP smear.  Would like to get Tetanus here but declines influenza vaccine.  - Patient to schedule PAP  - Mammogram referral placed  - Colonoscopy UTD, due 2028 - Patient would like to get Td booster at next visit when she gets labs

## 2019-07-15 NOTE — Assessment & Plan Note (Addendum)
Patient has started making some changes to her diet.  Would like to speak with a dietician regarding how best to use food to manage weight and hypertension.  BMI 44.3  - Referral information given to Dr. Jenne Campus (RD).  Patient to call Dr. Jenne Campus to see if she is available.   - Will continue to discuss weight interventions at next visit

## 2019-07-15 NOTE — Assessment & Plan Note (Addendum)
Reviewed recent lab work with patient.  GFR 31 (CKD Stage 3B). Patient states she was never told there was any kidney function abnormality until recently.  GFR <60 since 2011; gradually declining.   Patient has upcoming appointment with nephrologist next week.  Discussed need to keep this appointment. Patient agrees.   - Continue HTN medications (see above).  - Repeat BMP at next visit (patient declined labs today)

## 2019-07-15 NOTE — Addendum Note (Signed)
Addended by: [name] D on: 07/15/2019 02:35 PM   Modules accepted: Level of Service

## 2019-07-20 ENCOUNTER — Encounter: Payer: Self-pay | Admitting: Family Medicine

## 2019-07-20 ENCOUNTER — Other Ambulatory Visit: Payer: Self-pay

## 2019-07-20 ENCOUNTER — Ambulatory Visit (INDEPENDENT_AMBULATORY_CARE_PROVIDER_SITE_OTHER): Payer: Managed Care, Other (non HMO) | Admitting: Family Medicine

## 2019-07-20 ENCOUNTER — Other Ambulatory Visit (HOSPITAL_COMMUNITY)
Admission: RE | Admit: 2019-07-20 | Discharge: 2019-07-20 | Disposition: A | Discharge status: Home / Self Care | Payer: Managed Care, Other (non HMO) | Source: Ambulatory Visit | Attending: Family Medicine | Admitting: Family Medicine

## 2019-07-20 VITALS — BP 144/86 | HR 62 | Wt 300.8 lb

## 2019-07-20 DIAGNOSIS — Z23 Encounter for immunization: Secondary | ICD-10-CM

## 2019-07-20 DIAGNOSIS — Z124 Encounter for screening for malignant neoplasm of cervix: Secondary | ICD-10-CM | POA: Diagnosis not present

## 2019-07-20 DIAGNOSIS — N1832 Chronic kidney disease, stage 3b: Secondary | ICD-10-CM

## 2019-07-20 DIAGNOSIS — Z0000 Encounter for general adult medical examination without abnormal findings: Secondary | ICD-10-CM | POA: Insufficient documentation

## 2019-07-20 DIAGNOSIS — Z13220 Encounter for screening for lipoid disorders: Secondary | ICD-10-CM

## 2019-07-20 DIAGNOSIS — I10 Essential (primary) hypertension: Secondary | ICD-10-CM

## 2019-07-20 DIAGNOSIS — E66813 Obesity, class 3: Secondary | ICD-10-CM

## 2019-07-20 DIAGNOSIS — Z6841 Body Mass Index (BMI) 40.0 and over, adult: Secondary | ICD-10-CM

## 2019-07-20 DIAGNOSIS — Z131 Encounter for screening for diabetes mellitus: Secondary | ICD-10-CM | POA: Diagnosis not present

## 2019-07-20 DIAGNOSIS — G43009 Migraine without aura, not intractable, without status migrainosus: Secondary | ICD-10-CM

## 2019-07-20 LAB — POCT GLYCOSYLATED HEMOGLOBIN (HGB A1C): HbA1c, POC (controlled diabetic range): 6 % (ref 0.0–7.0)

## 2019-07-20 MED ORDER — ONDANSETRON 4 MG PO TBDP: 4.0000 mg | ORAL_TABLET | Freq: Three times a day (TID) | ORAL | 0 refills | Status: DC | PRN

## 2019-07-20 NOTE — Assessment & Plan Note (Signed)
Uncontrolled. Reporting BP 200s/100s - 170s/90s during migraines. Today BP 144/86.  Unsure if headaches are causing BP elevations or elevated BP's are causing headaches.  Favoring the latter.  - Continue Losartan 50 mg daily  - Continue amlodipine 10 mg daily  - May benefit from additional hypertensive therapy ([place]?) - follow up in 3 months

## 2019-07-20 NOTE — Assessment & Plan Note (Signed)
Patient to follow up with nephrologist tomorrow.  Last eGFR ~30 earlier this month

## 2019-07-20 NOTE — Assessment & Plan Note (Addendum)
Uncontrolled. Patient reporting headaches that wake her up from sleep. On chart review, no head imaging identified.   - Referral placed for neurology  - consider head imaging if patient does not follow up with neurology  - Zofran ODT sent to pharmacy  - Continue amtryptiline 10 mg qd prophalaxis   - Continue  sumatriptan q2h PRN as abortive therapy

## 2019-07-20 NOTE — Progress Notes (Signed)
Subjective:  Heidi Rivera is a 54 y.o. female who presents to the [facility] today with a chief complaint follow up.   HPI:  Heidi Rivera here for PAP smear and follow up. Denies vaginal discharge, dysuria, pelvic pain or abnormal vaginal bleeding.     Migraines  Patient had a migraine this past weekend that last greater than 72 hours.  She took her abortive medication with some relief.  Patient states headache woke her up out of her sleep. Taking her TCA daily.  Has nausea but denies vomiting.  Stayed in her bed for first two days of her migraine as she was unable to function.  During these events BP also elevated.     HTN  Patient reports BP 200s/100s during her migraines and came down to 170s/90s.  Denies chest pain or shortness of breath.     CKD  Patient to follow up with nephrologist tomorrow.      ROS: See HPI   Objective:  Physical Exam: BP (!) 144/86   Pulse 62   Wt (!) 300 lb 12.8 oz (136.4 kg)   LMP 06/29/2019   SpO2 100%   BMI 44.42 kg/m    GEN: pleasant female in no acute distress  CV: regular rate and rhythm, no murmurs appreciated  RESP: no increased work of breathing, clear to ascultation bilaterally  ABD: Bowel sounds present. Soft, Nontender, Nondistended. No palpable masses  MSK: no edema, normal ROM  SKIN: warm, dry NEURO: CN 2-12 grossly intact, gross sensation intact upper and lower extremities  Pelvic exam: normal external genitalia, vulva, vagina, cervix, uterus and adnexa, no palpable internal organs, PAP: Pap smear done today, exam chaperoned by CMA.  Results for orders placed or performed in visit on 07/20/19 (from the past 72 hour(s))  HgB A1c     Status: None   Collection Time: 07/20/19  4:10 PM  Result Value Ref Range   Hemoglobin A1C     HbA1c POC (<> result, manual entry)     HbA1c, POC (prediabetic range)     HbA1c, POC (controlled diabetic range) 6.0 0.0 - 7.0 %     Assessment/Plan:  Migraine without  aura Uncontrolled. Patient reporting headaches that wake her up from sleep. On chart review, no head imaging identified.   - Referral placed for neurology  - consider head imaging if patient does not follow up with neurology  - Zofran ODT sent to pharmacy  - Continue amtryptiline 10 mg qd prophalaxis   - Continue  sumatriptan q2h PRN as abortive therapy   ESSENTIAL HYPERTENSION, BENIGN Uncontrolled. Reporting BP 200s/100s - 170s/90s during migraines. Today BP 144/86.  Unsure if headaches are causing BP elevations or elevated BP's are causing headaches.  Favoring the latter.  - Continue Losartan 50 mg daily  - Continue amlodipine 10 mg daily  - May benefit from additional hypertensive therapy ([place]?) - follow up in 3 months   [place] III (MODERATE) Patient to follow up with nephrologist tomorrow.  Last eGFR ~30 earlier this month   Healthcare maintenance PAP smear performed to with [facility] / CT and HPV.  - Td given today  - declined influenza vaccine  - Patient to schedule mammogram  - colonoscopy UTD, due 2028     Orders Placed This Encounter  Procedures  . Tdap vaccine greater than or equal to 7yo IM  . Lipid Panel  . HIV antibody (with reflex)  . RPR  . Ambulatory referral to  Neurology    Referral Priority:   Routine    Referral Type:   Consultation    Referral Reason:   Specialty Services Required    Requested Specialty:   Neurology    Number of Visits Requested:   1  . HgB A1c    Meds ordered this encounter  Medications  . ondansetron (ZOFRAN ODT) 4 MG disintegrating tablet    Sig: Take 1 tablet (4 mg total) by mouth every 8 (eight) hours as needed for nausea or vomiting.    Dispense:  20 tablet    Refill:  0    Health Maintenance reviewed - mammogram ordered previously, patient to schedule appointment, patient declined influenza vaccination.  [name], DO PGY-1, [facility] 07/20/2019 3:27 PM

## 2019-07-20 NOTE — Assessment & Plan Note (Signed)
PAP smear performed to with [facility] / CT and HPV.  - Td given today  - declined influenza vaccine  - Patient to schedule mammogram  - colonoscopy UTD, due 2028

## 2019-07-20 NOTE — Patient Instructions (Signed)
It was great seeing you today!   I'd like to see you back in 3 months but if you need to be seen earlier than that for any new issues we're happy to fit you in, just give [place] a call!  - You will receive a phone call regarding appointment scheduling to see a neurologist.  - I will call you to discuss your lab work from today.  If you have not been contacted in one week please call the clinic.   If your headache are lasting more than 72 hours and you are not seeing any improvement with your home treatment please seek urgent medical attention and/or call the office.    If you have questions or concerns please do not hesitate to call at [phone].  Dr. Rushie [facility]

## 2019-07-21 LAB — HIV ANTIBODY (ROUTINE TESTING W REFLEX): HIV Screen 4th Generation wRfx: NONREACTIVE

## 2019-07-21 LAB — LIPID PANEL
Chol/HDL Ratio: 3.3 ratio (ref 0.0–4.4)
Cholesterol, Total: 198 mg/dL (ref 100–199)
HDL: 60 mg/dL (ref >39)
LDL Chol Calc (NIH): 119 mg/dL — ABNORMAL HIGH (ref 0–99)
Triglycerides: 106 mg/dL (ref 0–149)
VLDL Cholesterol Cal: 19 mg/dL (ref 5–40)

## 2019-07-21 LAB — RPR: RPR Ser Ql: NONREACTIVE

## 2019-07-22 LAB — CYTOLOGY - PAP
Adequacy: ABSENT
Chlamydia: NEGATIVE
Comment: NEGATIVE
Comment: NEGATIVE
Comment: NORMAL
Diagnosis: NEGATIVE
High risk HPV: NEGATIVE
Neisseria Gonorrhea: NEGATIVE

## 2019-07-27 ENCOUNTER — Encounter: Payer: Self-pay | Admitting: Family Medicine

## 2019-08-05 ENCOUNTER — Telehealth: Payer: Self-pay

## 2019-08-05 NOTE — Telephone Encounter (Signed)
Heidi Rivera from [facility] calls nurse line to follow up on form faxed over requesting most recent office visit note. Unable to find supporting documentation for this. Requested form to be refaxed to office to my attention for completion.   Talbot Grumbling, RN

## 2019-08-08 ENCOUNTER — Encounter: Payer: Self-pay | Admitting: Family Medicine

## 2019-08-08 ENCOUNTER — Other Ambulatory Visit: Payer: Self-pay

## 2019-08-08 ENCOUNTER — Ambulatory Visit (INDEPENDENT_AMBULATORY_CARE_PROVIDER_SITE_OTHER): Payer: Managed Care, Other (non HMO) | Admitting: Family Medicine

## 2019-08-08 VITALS — BP 135/70 | HR 70 | Temp 97.8°F | Wt 295.0 lb

## 2019-08-08 DIAGNOSIS — M545 Low back pain, unspecified: Secondary | ICD-10-CM

## 2019-08-08 NOTE — Progress Notes (Signed)
    SUBJECTIVE:   CHIEF COMPLAINT / HPI:   BACK PAIN  Back pain began 7 days ago. Pain is described as achy. Patient has tried pain. Pain radiates left lower side, but does not radiate only. History of trauma or injury: N Patient believes might be causing their pain: Unsure  Prior history of similar pain: N History of cancer: N Weak immune system:  N History of IV drug use: N History of steroid use: N  Symptoms Incontinence of bowel or bladder:  N Numbness of leg: N Fever: N Rest or Night pain: N Weight Loss:  N Rash: N   ROS see HPI Smoking Status noted.   PERTINENT  PMH / PSH:  CKD4  OBJECTIVE:   BP 135/70   Pulse 70   Temp 97.8 F (36.6 C) (Oral)   Wt 295 lb (133.8 kg)   LMP 08/03/2019   SpO2 98%   BMI 43.56 kg/m   General: No acute distress MSK: Left lower back tender to palpation, no gross effusion or bruising Neuro: Able to ambulate without assistance including getting up on the table, moves all extremities spontaneously  ASSESSMENT/PLAN:   Acute left-sided low back pain without sciatica Acute lower back pain without red flag symptoms.  History of CKD 4, recommend avoiding NSAIDs. -Tylenol as needed -Diclofenac as needed     Bonnita Hollow, MD [place]

## 2019-08-08 NOTE — Patient Instructions (Signed)

## 2019-08-08 NOTE — Assessment & Plan Note (Signed)
Acute lower back pain without red flag symptoms.  History of CKD 4, recommend avoiding NSAIDs. -Tylenol as needed -Diclofenac as needed

## 2019-08-10 ENCOUNTER — Other Ambulatory Visit: Payer: Self-pay | Admitting: Nephrology

## 2019-08-10 DIAGNOSIS — I129 Hypertensive chronic kidney disease with stage 1 through stage 4 chronic kidney disease, or unspecified chronic kidney disease: Secondary | ICD-10-CM

## 2019-08-10 NOTE — Telephone Encounter (Signed)
Heidi Rivera from [facility], calls nurse line to request most recent office visit notes. Faxed to number provided.   Talbot Grumbling, RN

## 2019-08-15 ENCOUNTER — Ambulatory Visit (INDEPENDENT_AMBULATORY_CARE_PROVIDER_SITE_OTHER): Payer: Managed Care, Other (non HMO) | Admitting: Family Medicine

## 2019-08-15 ENCOUNTER — Other Ambulatory Visit: Payer: Self-pay

## 2019-08-15 DIAGNOSIS — N1832 Chronic kidney disease, stage 3b: Secondary | ICD-10-CM | POA: Diagnosis not present

## 2019-08-15 DIAGNOSIS — E7800 Pure hypercholesterolemia, unspecified: Secondary | ICD-10-CM

## 2019-08-15 NOTE — Progress Notes (Signed)
Telehealth Encounter PCP [name], DO I connected with Heidi Rivera (MRN [id number]) on 08/15/2019 by MyChart video-enabled, HIPAA-compliant telemedicine application, verified that I am speaking with the correct person using two identifiers, and that the patient was in a private environment conducive to confidentiality.  The patient agreed to proceed.  Provider was Kennith Center, PhD, RD, LDN, CEDRD Provider was located at [facility] during this telehealth encounter; patient was at work  Appt start time: 1430 end time: 3419 (1 hour)  Assessment:  Primary concerns today: CKD stage 3b and LDL 119 on 07/20/19.  Heidi Rivera wants to learn how to eat better to help manage CKD, lipids, BP, and blood sugar.  She has made some dietary changes already, but has been confused about what changes are indicated.    Learning Readiness: Change in progress; has discontinued most sweet drinks, is trying to eat 3 meals/day, and getting more nutritional balance at meals.    Usual eating pattern: 2 meals and ~4 snacks per day. Frequent foods and beverages: water, ~32 oz sweet tea/day (or tea-lemonade); hamburger/beef, chx, salad, fish.   Avoided foods: steak, onions, avocado, green peppers.   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 Sleep: Estimates she gets ~8 hrs/night. To bed ~9:30; up ~7:30 or 8, but sleep is restless.  Granddaughter has noticed Heidi Rivera both snoring and waking at night.    24-hr recall: (Up at 11:30 AM) B ( AM)-   --- Snk ( AM)-   --- L (12 PM)-  2 salmon patty, 1 c grits, 1 tsp butter, water Snk (3 PM)-  1 c apple sauce Snk (5 PM)-  1 bag Sunchips D (7 PM)-  Wendy's apple pecan grilled chx salad, drsng, water Snk (10:30)-  1 handful grapes Typical day? Yes.  Has been trying to eat breakfast more often, e.g., fruit.    Handouts given during visit include:  After-Visit Summary (AVS)  Handouts on dietary  phosphorus and potassium   Demonstrated degree of understanding via:  Teach Back  Barriers to learning/adherence to lifestyle change: Longstanding dietary habits and inactivity.  Patient seems highly motivated to make necessary changes, however.    Monitoring/Evaluation:  Dietary intake and physical activity in 5 week(s).

## 2019-08-15 NOTE — Patient Instructions (Addendum)
Make an appt to talk with Dr. Susa Simmonds about sleep problems as well as what your nephrologist has said and is ordering for you.  Bring to your appt information about your nephrologist's office, so Dr. Susa Simmonds can request results.      Renal Disease Uncontrolled high blood pressure damages the kidneys, and injured kidneys results in worsened blood pressure.  Adequate potassium is helpful in managing blood pressure, so more fruits and vegetables are good for high blood pressure.  If kidney damage is severe enough, however, intake of potassium (and other minerals) will need to be limited.    For your specific kidney function, limit protein to "normal" portion sizes, i.e.,  aim for about 4 ounces of meat, fish, or poultry per meal.   You also do not need to limit fluids at this time, but again, you want a "normal" intake, up to ~64-80 oz.  - Sweetened teas or other sweet beverages spike blood sugar, so are not especially good for you.  One way to get used to less-sweet drinks is to wean yourself from the sweet teas by diluting with unsweetened tea.  (When reading labels, know that every 4 grams of sugar = 1 teaspoon of sugar.)  Here are some food goals that will be good for your kidneys, blood cholesterol, blood pressure, and blood sugar:  1. Eat at least 3 meals and 1-2 snacks per day.  Eat breakfast within one hour of getting up.  Aim for no more than 5 hours between eating.   For lunch and dinner, include some protein, some starch, and vegetables.   (OR: Would you serve this to a guest in your home, and call it a meal?) 2. Obtain about 1/3 vegetables, 1/3 protein, and 1/3 starchy foods for both lunch and dinner. (Limit starchy foods to 2 slices of bread per meal or 1 full cup of potatoes, pasta, rice). 3. Limit dietary sodium to 1500 mg per day.  For the next two weeks, pay attention to all food labels for sodium level.    Good snacks: yogurt, any fresh fruit, unsweetened apple sauce, string cheese,  2-3  tbsp unsalted nuts, high-fiber cereal with milk or soymilk (for protein).    At next visit, we'll confirm a physical activity goal.  (It can't hurt for you to increase your walking now.)  Follow-up telehealth visit via [facility] on Monday, April 26 at 1:30 PM.

## 2019-08-17 ENCOUNTER — Other Ambulatory Visit: Payer: Self-pay

## 2019-08-17 ENCOUNTER — Ambulatory Visit
Admission: RE | Admit: 2019-08-17 | Discharge: 2019-08-17 | Disposition: A | Discharge status: Home / Self Care | Payer: Managed Care, Other (non HMO) | Source: Ambulatory Visit | Attending: Family Medicine | Admitting: Family Medicine

## 2019-08-17 DIAGNOSIS — Z0000 Encounter for general adult medical examination without abnormal findings: Secondary | ICD-10-CM

## 2019-09-10 ENCOUNTER — Encounter: Payer: Self-pay | Admitting: Radiology

## 2019-09-10 ENCOUNTER — Ambulatory Visit
Admission: RE | Admit: 2019-09-10 | Discharge: 2019-09-10 | Disposition: A | Discharge status: Home / Self Care | Payer: Managed Care, Other (non HMO) | Source: Ambulatory Visit | Attending: Nephrology | Admitting: Nephrology

## 2019-09-10 DIAGNOSIS — I129 Hypertensive chronic kidney disease with stage 1 through stage 4 chronic kidney disease, or unspecified chronic kidney disease: Secondary | ICD-10-CM

## 2019-09-10 MED ORDER — GADOBENATE DIMEGLUMINE 529 MG/ML IV SOLN
20.0000 mL | Freq: Once | INTRAVENOUS | Status: AC | PRN
  Administered 2019-09-10: 20 mL via INTRAVENOUS

## 2019-09-12 NOTE — Progress Notes (Signed)
Virtual Visit via Video Note The purpose of this virtual visit is to provide medical care while limiting exposure to the novel coronavirus.    Consent was obtained for video visit:  Yes.   Answered questions that patient had about telehealth interaction:  Yes.   I discussed the limitations, risks, security and privacy concerns of performing an evaluation and management service by telemedicine. I also discussed with the patient that there may be a patient responsible charge related to this service. The patient expressed understanding and agreed to proceed.  Pt location: Home Physician Location: office Name of referring provider:  Leeanne Rio, MD I connected with Heidi Rivera at patients initiation/request on 09/13/2019 at  1:50 PM EDT by video enabled telemedicine application and verified that I am speaking with the correct person using two identifiers. Pt MRN:  [id number] Pt DOB:  1966-03-08 Video Participants:  Heidi Rivera   History of Present Illness:  Heidi Rivera is a 54 year old right-handed black female with HTN and CKD stage III who presents for migraines.  History supplemented by family medicine notes.  She has had migraines since her early 31s.  They are severe bifrontal-temporal pressure over her eyes.  There is associated nausea, decreased appetite, photophobia, phonophobia, osmophobia and neck stiffness but no vomiting, speech disturbance, weakness or numbness.  Over the past year, she was having uncontrolled hypertension and was diagnosed with CKD stage 3.  Migraines started to become less severe but associated with visual aura of seeing bright spots in her vision.  They were lasting 3 to 4 days.  Sumatriptan is helpful, in which she gets relief in 30-60 minutes and completely resolves in about 1 to 2 days.  In past 3 weeks, she has had 3 migraines.  The smell of perfume and onions trigger them.  She was previously taking Excedrin Migraine  frequently but stopped as it contributed to her kidney dysfunction.  Current NSAIDS:  none Current analgesics:  none Current triptans:  Sumatriptan 50mg  Current ergotamine:  none Current anti-emetic:  Zofran ODT 4mg  Current muscle relaxants:  none Current anti-anxiolytic:  none Current sleep aide:  none Current Antihypertensive medications:  Amlodipine; losartan  Current Antidepressant medications:  none Current Anticonvulsant medications:  none Current anti-CGRP:  none Current Vitamins/Herbal/Supplements:  none Current Antihistamines/Decongestants:  none Other therapy:  none Hormone/birth control:  none  Past NSAIDS:  Ibuprofen, naproxen Past analgesics:  Excedrin Migraine (effective); acetaminophen; tramadol Past abortive triptans:  none Past abortive ergotamine:  none Past muscle relaxants:  none Past anti-emetic:  none Past antihypertensive medications:  none Past antidepressant medications:  Amitriptyline 10mg  (caused elevated blood pressure) Past anticonvulsant medications:  topiramate Past anti-CGRP:  none Past vitamins/Herbal/Supplements:  none Past antihistamines/decongestants:  Flonase Other past therapies:  none  Caffeine:  No coffee or soda. Diet:  Water.  1 bottle of iced green tea.  Seeing a dietician.   Exercise:  Not routine Depression:  some; Anxiety:  some Other pain:  no Sleep:  Okay.  Questionable OSA as she has daytime fatigue.  07/05/2019 BMP with Na 140, K 3.9, Cl103, CO2 22, glucose 77, BUN 16, Cr 2.04 (baseline)   Past Medical History: Past Medical History:  Diagnosis Date  . Acute gastritis   . Gout   . Hypertension   . Migraines   . Obesity   . Vertigo     Medications: Outpatient Encounter Medications as of 09/13/2019  Medication Sig  . albuterol (PROVENTIL HFA;VENTOLIN HFA) 108 (  90 BASE) MCG/ACT inhaler Inhale 2 puffs into the lungs every 4 (four) hours as needed for wheezing or shortness of breath. (Patient not taking: Reported on  08/15/2019)  . amLODipine (NORVASC) 10 MG tablet Take 1 tablet (10 mg total) by mouth at bedtime.  . Blood Pressure Monitoring (BLOOD PRESSURE CUFF) MISC 1 Device by Does not apply route daily.  [name] losartan (COZAAR) 50 MG tablet Take 0.5 tablets (25 mg total) by mouth at bedtime. (Patient taking differently: Take 50 mg by mouth in the morning and at bedtime. )  . ondansetron (ZOFRAN ODT) 4 MG disintegrating tablet Take 1 tablet (4 mg total) by mouth every 8 (eight) hours as needed for nausea or vomiting. (Patient not taking: Reported on 08/15/2019)  . SUMAtriptan (IMITREX) 50 MG tablet TAKE 1 TABLET BY MOUTH EVERY 2 HOURS AS NEEDED FOR MIGRAINE. MAY REPEAT IN 2 HOURS IF HEADACHE PERSISTS OR RECURS.  . [DISCONTINUED] fluticasone (FLONASE) 50 MCG/ACT nasal spray Place 2 sprays into both nostrils daily.   No facility-administered encounter medications on file as of 09/13/2019.    Allergies: Allergies  Allergen Reactions  . Tramadol Other (See Comments)    Intolerance : GI Upset    Family History: Family History  Problem Relation Age of Onset  . Diabetes Mother   . Breast cancer Mother   . Other Mother        Larynx cancer  . Diabetes Maternal Aunt   . Hypertension Father   . Hypercholesterolemia Father   . Other Father        DDD  . Cancer - Other Paternal Grandmother        bone  . Colon cancer Maternal Uncle     Social History: Social History   Socioeconomic History  . Marital status: Legally Separated    Spouse name: Not on file  . Number of children: 3  . Years of education: Not on file  . Highest education level: Not on file  Occupational History  . 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 Smoking status: Never Smoker  . Smokeless tobacco: Never Used  Substance and Sexual Activity  . Alcohol use: Yes    Comment: rare  . Drug use: No  . Sexual activity: Not on file  Other Topics Concern  . Not on file  Social History Narrative   Lives with sister and her  husband who smokes    Social Determinants of Health   Financial Resource Strain:   . Difficulty of Paying Living Expenses:   Food Insecurity:   . Worried About Charity fundraiser in the Last Year:   . Arboriculturist in the Last Year:   Transportation Needs:   . Film/video editor (Medical):   [name] Lack of Transportation (Non-Medical):   Physical Activity:   . Days of Exercise per Week:   . Minutes of Exercise per Session:   Stress:   . Feeling of Stress :   Social Connections:   . Frequency of Communication with Friends and Family:   . Frequency of Social Gatherings with Friends and Family:   . Attends Religious Services:   . Active Member of Clubs or Organizations:   . Attends Archivist Meetings:   [name] Marital Status:   Intimate Partner Violence:   . Fear of Current or Ex-Partner:   . Emotionally Abused:   [name] Physically Abused:   . Sexually Abused:     Observations/Objective:   There  were no vitals taken for this visit. No acute distress.  Alert and oriented.  Speech fluent and not dysarthric.  Language intact.  Eyes orthophoric on primary gaze.  Face symmetric.  Assessment and Plan:   Migraine with aura, without status migrainosus, intractable Excessive daytime somnolence  1.  For preventative management, we will start Aimovig 70mg  monthly.  She has failed amitriptyline and topiramate.  She is currently already on antihypertensive medications. 2.  For abortive therapy, increase sumatriptan to 100mg  3.  Limit use of pain relievers to no more than 2 days out of week to prevent risk of rebound or medication-overuse headache. 4. Refer for sleep study to evaluate for OSA 5.  Keep headache diary 6.  Exercise, hydration, caffeine cessation, sleep hygiene, monitor for and avoid triggers 7.  Follow up 4 months.   Follow Up Instructions:    -I discussed the assessment and treatment plan with the patient. The patient was provided an opportunity to ask questions and  all were answered. The patient agreed with the plan and demonstrated an understanding of the instructions.   The patient was advised to call back or seek an in-person evaluation if the symptoms worsen or if the condition fails to improve as anticipated.   Dudley Major, DO

## 2019-09-13 ENCOUNTER — Telehealth (INDEPENDENT_AMBULATORY_CARE_PROVIDER_SITE_OTHER): Payer: Managed Care, Other (non HMO) | Admitting: Neurology

## 2019-09-13 ENCOUNTER — Other Ambulatory Visit: Payer: Self-pay

## 2019-09-13 DIAGNOSIS — G43119 Migraine with aura, intractable, without status migrainosus: Secondary | ICD-10-CM | POA: Diagnosis not present

## 2019-09-13 DIAGNOSIS — R400 Somnolence: Secondary | ICD-10-CM | POA: Diagnosis not present

## 2019-09-13 DIAGNOSIS — G4730 Sleep apnea, unspecified: Secondary | ICD-10-CM

## 2019-09-13 MED ORDER — AIMOVIG 70 MG/ML ~~LOC~~ SOAJ: 70.0000 mg | SUBCUTANEOUS | 11 refills | Status: DC

## 2019-09-13 MED ORDER — SUMATRIPTAN SUCCINATE 100 MG PO TABS: ORAL_TABLET | ORAL | 3 refills | Status: DC

## 2019-09-19 ENCOUNTER — Ambulatory Visit (INDEPENDENT_AMBULATORY_CARE_PROVIDER_SITE_OTHER): Payer: Managed Care, Other (non HMO) | Admitting: Family Medicine

## 2019-09-19 ENCOUNTER — Encounter: Payer: Self-pay | Admitting: *Deleted

## 2019-09-19 ENCOUNTER — Other Ambulatory Visit: Payer: Self-pay

## 2019-09-19 DIAGNOSIS — E7800 Pure hypercholesterolemia, unspecified: Secondary | ICD-10-CM | POA: Diagnosis not present

## 2019-09-19 DIAGNOSIS — N1832 Chronic kidney disease, stage 3b: Secondary | ICD-10-CM

## 2019-09-19 NOTE — Progress Notes (Signed)
Telehealth Encounter PCP [name], DO I connected with Heidi Rivera (MRN [id number]) on 09/19/2019 by MyChart video-enabled, HIPAA-compliant telemedicine application, verified that I am speaking with the correct person using two identifiers, and that the patient was in a private environment conducive to confidentiality.  The patient agreed to proceed.  Provider was Kennith Center, PhD, RD, LDN, CEDRD Provider was located at [facility] during this telehealth encounter; patient was at work  Appt start time: 1330 end time: 1430 (1 hour)  Assessment:  Primary concerns today: CKD stage 3b and LDL 119 on 07/20/19.  Ms. Strehl has been reading labels, and is trying to limit sodium, but has not tallied daily total yet.  She is getting more vegetables, and has switched from canned to mostly frozen veg's.  She has made an effort to incorporate pro, carb, and veg's to meals.   Has been having dry mouth a couple weeks ago; not sure why (no change in med's).  Will be getting a sleep study ordered by neurologist.    Learning Readiness: Change in progress; has discontinued most sweet drinks, is trying to eat 3 meals/day, and getting more nutritional balance at meals.   Recent eating pattern: 2-3 meals and 2-4 snacks per day; not getting breakfast lately.   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 Sleep: Estimates she gets ~8 hrs/night. To bed ~9:30; up ~7:30 or 8, but sleep is restless.  Granddaughter has noticed both snoring and waking at night.    24-hr recall:  (Up at 9:30 AM) B (11:30 AM)- 1 1/2 c Rice&Wheat Chex, 1/2 c 2% milk, water Snk ( AM)-  --- L (2 PM)-  1/3 c steamed shrimp, 1/4 c coleslaw, 4 hush puppies, water, 12 oz lemonade Snk (5 PM)-  5-6 grapes, 1 apple D (7:30 PM)-  1/2 c steamed cabbage, 1 fried chx leg, 1/3 c mac&chs, water Snk ( PM)-  water Typical day? Yes.  Although usually gets more water.    Handouts  given during visit include:  After-Visit Summary (AVS)  Demonstrated degree of understanding via:  Teach Back  Barriers to learning/adherence to lifestyle change: Longstanding dietary habits and inactivity.  Patient seems highly motivated to make necessary changes, however.    Monitoring/Evaluation:  Dietary intake and physical activity in 4 week(s).

## 2019-09-19 NOTE — Progress Notes (Addendum)
Heidi Rivera (Key: [id number]) Aimovig 70MG /ML auto-injectors   Form Librarian, academic PA Form [phone] NCPDP) Created 22 hours ago Sent to Plan 22 hours ago Plan Response 22 hours ago Submit Clinical Questions 22 hours ago Determination Favorable 2 hours ago Message from Plan [id number];Review Type:Prior Auth;Coverage Start Date:09/19/2019;Coverage End Date:03/18/2020;

## 2019-09-19 NOTE — Patient Instructions (Addendum)
-   For drinks, try mixing juice with club soda.  Ultimately, a good goal is to drink JUST flavored (not sweetened) club soda or sparkling water.    Goals:  1. To make your 3 meals a day happen, fix something for breakfast the night before.   2. [place]met[place] Marker your total daily sodium intake according to the food labels.       Look online for sodium information for specific foods and restaurant (See https://www.lee.info/).  Remember that restaurant foods tend to be high in sodium.  3. Walk at least 20 minutes 3 times a week.      Document your walking time each time you go.    Congratulations on making some excellent progress on your goals!  Follow-up telehealth appt via MyChart on Monday, May 24 at 3:30 PM.

## 2019-10-13 ENCOUNTER — Other Ambulatory Visit: Payer: Self-pay

## 2019-10-13 ENCOUNTER — Telehealth (INDEPENDENT_AMBULATORY_CARE_PROVIDER_SITE_OTHER): Payer: Managed Care, Other (non HMO) | Admitting: Family Medicine

## 2019-10-13 DIAGNOSIS — J040 Acute laryngitis: Secondary | ICD-10-CM

## 2019-10-13 HISTORY — DX: Acute laryngitis: J04.0

## 2019-10-13 MED ORDER — BENZONATATE 100 MG PO CAPS: 100.0000 mg | ORAL_CAPSULE | Freq: Two times a day (BID) | ORAL | 0 refills | Status: DC | PRN

## 2019-10-13 NOTE — Progress Notes (Signed)
[facility] Telemedicine Visit  Patient consented to have virtual visit and was identified by name and date of birth. Method of visit: Video  Encounter participants: Patient: Heidi Rivera - located at home Provider: Matilde Haymaker - located at [employer] Others (if applicable): None  Chief Complaint: Sore throat  HPI:  Laryngitis Ms. Diop reports that she has had a sore throat and loss of voice for roughly the past week.  She seems to have gotten over the worst symptoms already and seems to be recovering at this point.  She notes that she lost her voice entirely on Sunday and also noted significant sore throat and a tender neck.  She is also had a persistent cough for the duration of her symptoms.  At home, the past week she has been trying to drink teas and broths to soothe her throat.  She is concerned that she may have strep throat.  She denies any fevers, body aches.  She is curious to know if she should be tested for strep throat or if she should be given antibiotics.  She has not been tested for Covid.  She has not had the Covid vaccine.   ROS: per HPI  Pertinent PMHx: Noncontributory  Exam:  General: Well-appearing middle-aged woman seated comfortably on her couch at home.  No acute distress. Respiratory: Breathing comfortably on room air.  Assessment/Plan:  Laryngitis She was told that she is likely experienced a case of viral laryngitis.  She was encouraged to have a Covid test done.  She was told that she does not technically need any time off of work for this condition.  She is informed that this should resolve on its own.  She can continue soothing broths and teas at home.  She was encouraged to call back with any new concerns or worsening symptoms. -Tessalon Perles sent in for cough    Time spent during visit with patient: 15 minutes

## 2019-10-13 NOTE — Assessment & Plan Note (Addendum)
She was told that she is likely experienced a case of viral laryngitis.  She was encouraged to have a Covid test done.  She was told that she does not technically need any time off of work for this condition.  She is informed that this should resolve on its own.  She can continue soothing broths and teas at home.  She was encouraged to call back with any new concerns or worsening symptoms. -Tessalon Perles sent in for cough

## 2019-10-17 ENCOUNTER — Encounter: Payer: Managed Care, Other (non HMO) | Admitting: Family Medicine

## 2019-10-17 ENCOUNTER — Other Ambulatory Visit: Payer: Self-pay

## 2019-10-17 NOTE — Progress Notes (Signed)
This encounter was created in error - please disregard.

## 2019-12-30 ENCOUNTER — Telehealth: Payer: Self-pay

## 2019-12-30 NOTE — Telephone Encounter (Signed)
NOTES ON FILE FROM  [facility] [phone], SENT REFERRAL TO SCHEDULING

## 2020-01-13 ENCOUNTER — Ambulatory Visit: Payer: Managed Care, Other (non HMO)

## 2020-01-13 ENCOUNTER — Emergency Department (HOSPITAL_COMMUNITY)
Admission: EM | Admit: 2020-01-13 | Discharge: 2020-01-13 | Disposition: A | Discharge status: Home / Self Care | Payer: Managed Care, Other (non HMO) | Attending: Emergency Medicine | Admitting: Emergency Medicine

## 2020-01-13 ENCOUNTER — Other Ambulatory Visit: Payer: Self-pay

## 2020-01-13 ENCOUNTER — Encounter (HOSPITAL_COMMUNITY): Payer: Self-pay | Admitting: Pediatrics

## 2020-01-13 DIAGNOSIS — I10 Essential (primary) hypertension: Secondary | ICD-10-CM | POA: Diagnosis not present

## 2020-01-13 DIAGNOSIS — R531 Weakness: Secondary | ICD-10-CM | POA: Insufficient documentation

## 2020-01-13 DIAGNOSIS — Z79899 Other long term (current) drug therapy: Secondary | ICD-10-CM | POA: Insufficient documentation

## 2020-01-13 DIAGNOSIS — G8929 Other chronic pain: Secondary | ICD-10-CM

## 2020-01-13 DIAGNOSIS — M5442 Lumbago with sciatica, left side: Secondary | ICD-10-CM | POA: Insufficient documentation

## 2020-01-13 MED ORDER — PREDNISONE 20 MG PO TABS: 40.0000 mg | ORAL_TABLET | Freq: Every day | ORAL | 0 refills | Status: AC

## 2020-01-13 MED ORDER — CYCLOBENZAPRINE HCL 10 MG PO TABS: 10.0000 mg | ORAL_TABLET | Freq: Two times a day (BID) | ORAL | 0 refills | Status: DC | PRN

## 2020-01-13 MED ORDER — MORPHINE SULFATE (PF) 2 MG/ML IV SOLN: 4.0000 mg | Freq: Once | INTRAVENOUS | Status: DC

## 2020-01-13 MED ORDER — PREDNISONE 20 MG PO TABS
40.0000 mg | ORAL_TABLET | Freq: Once | ORAL | Status: AC
  Administered 2020-01-13: 40 mg via ORAL
  Filled 2020-01-13: qty 2

## 2020-01-13 MED ORDER — OXYCODONE-ACETAMINOPHEN 5-325 MG PO TABS
1.0000 | ORAL_TABLET | Freq: Once | ORAL | Status: AC
  Administered 2020-01-13: 1 via ORAL
  Filled 2020-01-13: qty 1

## 2020-01-13 NOTE — ED Provider Notes (Signed)
[facility] EMERGENCY DEPARTMENT Provider Note   CSN: [id number] Arrival date & time: 01/13/20  1012     History Chief Complaint  Patient presents with  . Back Pain    Heidi Rivera is a 54 y.o. female.  HPI   Patient presents to the emergency department with chief complaint of left lower back pain with radiating pain down to her left leg as well as up into her back.  She states she has had chronic back pain but over the last week or so the pain has gotten worse and this morning she was unable to get out of bed or put on her pants.  She denies any recent or past trauma to the area, has had no surgery done to her back and is not seen by a spine specialist.  She admits that moving her back or lifting up her left leg causes pain.  She describes the pain as constant with intermittent back spasms which comes and goes.  She has tried placing ice and heat on it as well as taking over-the-counter pain medication without any relief.  She denies urinary incontinence, loss control of her bowels, paresthesias in her legs.  Patient denies headache, fever, chills, shortness of breath, chest pain, dominant, pedal edema.  Past Medical History:  Diagnosis Date  . Acute gastritis   . Gout   . Hypertension   . Migraines   . Obesity   . Vertigo     Patient Active Problem List   Diagnosis Date Noted  . Laryngitis 10/13/2019  . Healthcare maintenance 07/15/2019  . Stress incontinence 06/17/2019  . Costochondritis, acute 06/17/2019  . Migraine without aura 07/11/2008  . CHRONIC KIDNEY DISEASE STAGE III (MODERATE) 07/11/2008  . ESSENTIAL HYPERTENSION, BENIGN 08/30/2007  . OBESITY, NOS 07/23/2006  . Acute left-sided low back pain without sciatica 07/23/2006    Past Surgical History:  Procedure Laterality Date  . HEMORRHOID SURGERY       OB History   No obstetric history on file.     Family History  Problem Relation Age of Onset  . Diabetes Mother   . Breast  cancer Mother   . Other Mother        Larynx cancer  . Diabetes Maternal Aunt   . Hypertension Father   . Hypercholesterolemia Father   . Other Father        DDD  . Cancer - Other Paternal Grandmother        bone  . Colon cancer Maternal Uncle     Social History   Tobacco Use  . Smoking status: Never Smoker  . Smokeless tobacco: Never Used  Vaping Use  . Vaping Use: Never used  Substance Use Topics  . Alcohol use: Yes    Comment: rare  . Drug use: No    Home Medications Prior to Admission medications   Medication Sig Start Date End Date Taking? Authorizing Provider  albuterol (PROVENTIL HFA;VENTOLIN HFA) 108 (90 BASE) MCG/ACT inhaler Inhale 2 puffs into the lungs every 4 (four) hours as needed for wheezing or shortness of breath. 10/27/14  Yes Marylene Land, NP  amLODipine (NORVASC) 10 MG tablet Take 1 tablet (10 mg total) by mouth at bedtime. Patient taking differently: Take 10 mg by mouth at bedtime. Pt takes it in the morning. 06/17/19  Yes Mullis, Kiersten P, DO  Blood Pressure Monitoring (BLOOD PRESSURE CUFF) MISC 1 Device by Does not apply route daily. 06/17/19  Yes Mullis, Kiersten P, DO  Erenumab-aooe (AIMOVIG) 70 MG/ML SOAJ Inject 70 mg into the skin every 28 (twenty-eight) days. 09/13/19  Yes Jaffe, Adam R, DO  furosemide (LASIX) 40 MG tablet Take 40 mg by mouth daily. 12/21/19  Yes [provider]  losartan (COZAAR) 50 MG tablet Take 0.5 tablets (25 mg total) by mouth at bedtime. Patient taking differently: Take 150 mg by mouth daily. 50 mg in the morning , and 100 mg in the evening 07/05/19  Yes Winfrey, Alcario Drought, MD  SUMAtriptan (IMITREX) 100 MG tablet Take 1 tablet earliest onset of migraine.  May repeat in 2 hours if headache persists or recurs.  Maximum 2 tablets in 24 hours. 09/13/19  Yes Jaffe, Adam R, DO  Vitamin D, Ergocalciferol, (DRISDOL) 1.25 MG (50000 UNIT) CAPS capsule Take 50,000 Units by mouth once a week. 12/22/19  Yes [provider]    benzonatate (TESSALON) 100 MG capsule Take 1 capsule (100 mg total) by mouth 2 (two) times daily as needed for cough. 10/13/19   Matilde Haymaker, MD  cyclobenzaprine (FLEXERIL) 10 MG tablet Take 1 tablet (10 mg total) by mouth 2 (two) times daily as needed for muscle spasms. 01/13/20   Marcello Fennel, PA-C  ondansetron (ZOFRAN ODT) 4 MG disintegrating tablet Take 1 tablet (4 mg total) by mouth every 8 (eight) hours as needed for nausea or vomiting. 07/20/19   Brimage, Ronnette Juniper, DO  predniSONE (DELTASONE) 20 MG tablet Take 2 tablets (40 mg total) by mouth daily for 5 days. 01/13/20 01/18/20  Marcello Fennel, PA-C  fluticasone (FLONASE) 50 MCG/ACT nasal spray Place 2 sprays into both nostrils daily. 09/19/18 06/15/19  Ok Edwards, PA-C    Allergies    Tramadol  Review of Systems   Review of Systems  Constitutional: Negative for chills and fever.  HENT: Negative for congestion.   Respiratory: Negative for shortness of breath.   Cardiovascular: Negative for chest pain.  Gastrointestinal: Negative for abdominal pain.  Genitourinary: Negative for enuresis.  Musculoskeletal: Positive for back pain.  Skin: Negative for rash.  Neurological: Positive for weakness. Negative for dizziness, seizures, numbness and headaches.  Hematological: Does not bruise/bleed easily.    Physical Exam Updated Vital Signs BP (!) 151/95 (BP Location: Right Arm)   Pulse (!) 53   Temp 98.8 F (37.1 C) (Oral)   Resp 16   Ht 5' 9.5" (1.765 m)   Wt (!) 138.8 kg   SpO2 98%   BMI 44.54 kg/m   Physical Exam Vitals and nursing note reviewed.  Constitutional:      General: She is not in acute distress.    Appearance: Normal appearance. She is not ill-appearing or diaphoretic.  HENT:     Head: Normocephalic and atraumatic.     Nose: No congestion or rhinorrhea.     Mouth/Throat:     Mouth: Mucous membranes are moist.     Pharynx: Oropharynx is clear.  Eyes:     General: No visual field deficit or scleral  icterus.    Conjunctiva/sclera: Conjunctivae normal.     Pupils: Pupils are equal, round, and reactive to light.  Cardiovascular:     Rate and Rhythm: Normal rate and regular rhythm.     Pulses: Normal pulses.     Heart sounds: No murmur heard.  No friction rub. No gallop.   Pulmonary:     Effort: Pulmonary effort is normal. No respiratory distress.     Breath sounds: No wheezing, rhonchi or rales.  Abdominal:  General: There is no distension.     Palpations: Abdomen is soft.     Tenderness: There is no abdominal tenderness. There is no right CVA tenderness, left CVA tenderness or guarding.  Musculoskeletal:        General: No swelling or tenderness.     Right lower leg: No edema.     Left lower leg: No edema.     Comments: Patient spine was visualized, no gross abnormalities noted, spine was palpated nontender along the spine, tenderness along the paraspinal muscles.  Legs were evaluated, no rashes, lacerations, ecchymosis, edema or other gross abnormalities noted, she was able to dorsi and plantarflex her ankles without difficulty, she is able to flex and extend at the knee and hip, there was decreased range of motion due to pain. Strength was 5 out of 4 on left side compared to right due to pain. Neurovascular fully intact.  Skin:    General: Skin is warm and dry.     Findings: No rash.  Neurological:     General: No focal deficit present.     Mental Status: She is alert and oriented to person, place, and time.     GCS: GCS eye subscore is 4. GCS verbal subscore is 5. GCS motor subscore is 6.     Cranial Nerves: Cranial nerves are intact. No cranial nerve deficit or facial asymmetry.     Sensory: Sensation is intact. No sensory deficit.     Motor: Motor function is intact. No weakness or pronator drift.     Coordination: Coordination is intact. Romberg sign negative. Finger-Nose-Finger Test and Heel to [facility] Test normal.  Psychiatric:        Mood and Affect: Mood normal.      ED Results / Procedures / Treatments   Labs (all labs ordered are listed, but only abnormal results are displayed) Labs Reviewed - No data to display  EKG None  Radiology No results found.  Procedures Procedures (including critical care time)  Medications Ordered in ED Medications  predniSONE (DELTASONE) tablet 40 mg (40 mg Oral Given 01/13/20 1437)  oxyCODONE-acetaminophen (PERCOCET/ROXICET) 5-325 MG per tablet 1 tablet (1 tablet Oral Given 01/13/20 1437)    ED Course  I have reviewed the triage vital signs and the nursing notes.  Pertinent labs & imaging results that were available during my care of the patient were reviewed by me and considered in my medical decision making (see chart for details).    MDM Rules/Calculators/A&P                          I have personally reviewed all imaging, labs and have interpreted them.  Patient presents with lower back pain as well as left lower leg pain. Patient was alert and oriented, did not appear to be in any acute distress, vital signs reassuring. On exam she had tenderness along her perispinal muscles, she had full range of motion at her hip, knee, ankle, she had decreased strength on the left side 4/5 at the hip flexor and knee due to pain. Neurovascular fully intact.  I have low suspicion for spinal equina or spinal cord lesion as she denies incontinency, loss of bowels, paresthesias in her legs, she had full range of motion at her hip, knee, ankle, with decreased strength 4-5 at her knee and hip flexor on the left side due to pain. This is likely a pinched nerve as she describes the pain as a electrical  shock. We will give her pain meds and dose of steroids. No indication for further imaging. Low suspicion for septic arthritis as patient denies IV drug use, joints were visualized they were nonerythematous, not hot to the touch. Low suspicion for systemic infection as patient is nontoxic-appearing, vital signs reassuring no  obvious source of infection seen on exam. Due to well-appearing patient further lab work and imaging were not warranted.  Patient appears resting comfortably in bed show no acute signs stress. Vital signs have remained stable does not meet criteria to be admitted to the hospital. Likely patient is suffering from a pinched nerve which causes her pain up and down her leg. Will prescribe her muscle relaxers as well as steroids to help decrease inflammation as well as refer her to neurosurgery for further evaluation. Patient discussed with attending who agreed with assessment and plan. Patient was given at home care as well strict return precautions. Patient verbalized that she understood and agreed to plan. Final Clinical Impression(s) / ED Diagnoses Final diagnoses:  Chronic left-sided low back pain with left-sided sciatica    Rx / DC Orders ED Discharge Orders         Ordered    predniSONE (DELTASONE) 20 MG tablet  Daily        01/13/20 1450    cyclobenzaprine (FLEXERIL) 10 MG tablet  2 times daily PRN        01/13/20 1450           Aron Baba 01/13/20 1546    Isla Pence, MD 01/13/20 1549

## 2020-01-13 NOTE — ED Triage Notes (Signed)
Arrived via EMS; c/o exacerbation of chronic back pain x 1 week. Difficulty sitting and standing. C/O back spasm and shooting pain down left leg.

## 2020-01-13 NOTE — Discharge Instructions (Addendum)
You have been seen here for lower back pain.  Exam look reassuring.  I prescribed you steroids please take as prescribed.  I have also prescribed you muscle relaxer please use as prescribed.  Do not operate heavy machinery or drink while taking this medication as it can cause you become very drowsy.  I want you to continue taking over-the-counter pain medications like Tylenol as this is safe for pain control without damaging her kidneys.  I want you to follow-up with your primary care doctor for further evaluation and management.  I have given you contact arrange for a neurosurgeon who can look at your back for further evaluation.  I want to come back to the emergency department if you develop urinary incontinency, lose control of your bowels, have the inability to move your leg or foot, chest pain, shortness of breath, uncontrolled nausea, vomiting, diarrhea as the symptoms require further evaluation and management.

## 2020-01-20 NOTE — Progress Notes (Addendum)
NEUROLOGY FOLLOW UP OFFICE NOTE  Heidi Rivera [id number]  HISTORY OF PRESENT ILLNESS: Heidi Rivera is a 54 year old right-handed black female with HTN and CKD stage III who follows up today for low back pain.  In mid-August, she developed left lower back pain.  No preceding trauma or precipitating physical activity.  Prolonged sitting aggravated it.  Pain progressively got worse, causing numbness and tingling along the lateral and anterior left thigh down lateral leg and over dorsum of foot with associated shooting pain.  No bowel or bladder dysfunction, saddle anesthesia or focal lower extremity weakness.  However, due to severe pain, she was unable to walk and was stuck on the toilet for 45 minutes because she couldn't stand.  She went to the ED on 01/13/2020 where she was prescribed Flexeril and a prednisone taper.  She is now able to ambulate but still with pain.  Migraines are well-controlled.  PAST MEDICAL HISTORY: Past Medical History:  Diagnosis Date  . Acute gastritis   . Gout   . Hypertension   . Migraines   . Obesity   . Vertigo     MEDICATIONS: Current Outpatient Medications on File Prior to Visit  Medication Sig Dispense Refill  . albuterol (PROVENTIL HFA;VENTOLIN HFA) 108 (90 BASE) MCG/ACT inhaler Inhale 2 puffs into the lungs every 4 (four) hours as needed for wheezing or shortness of breath. 1 Inhaler 0  . amLODipine (NORVASC) 10 MG tablet Take 1 tablet (10 mg total) by mouth at bedtime. (Patient taking differently: Take 10 mg by mouth at bedtime. Pt takes it in the morning.) 90 tablet 3  . benzonatate (TESSALON) 100 MG capsule Take 1 capsule (100 mg total) by mouth 2 (two) times daily as needed for cough. 20 capsule 0  . Blood Pressure Monitoring (BLOOD PRESSURE CUFF) MISC 1 Device by Does not apply route daily. 1 each 0  . cyclobenzaprine (FLEXERIL) 10 MG tablet Take 1 tablet (10 mg total) by mouth 2 (two) times daily as needed for muscle spasms. 20  tablet 0  . Erenumab-aooe (AIMOVIG) 70 MG/ML SOAJ Inject 70 mg into the skin every 28 (twenty-eight) days. 1 pen 11  . furosemide (LASIX) 40 MG tablet Take 40 mg by mouth daily.    [name] losartan (COZAAR) 50 MG tablet Take 0.5 tablets (25 mg total) by mouth at bedtime. (Patient taking differently: Take 150 mg by mouth daily. 50 mg in the morning , and 100 mg in the evening) 30 tablet 2  . ondansetron (ZOFRAN ODT) 4 MG disintegrating tablet Take 1 tablet (4 mg total) by mouth every 8 (eight) hours as needed for nausea or vomiting. 20 tablet 0  . SUMAtriptan (IMITREX) 100 MG tablet Take 1 tablet earliest onset of migraine.  May repeat in 2 hours if headache persists or recurs.  Maximum 2 tablets in 24 hours. 10 tablet 3  . Vitamin D, Ergocalciferol, (DRISDOL) 1.25 MG (50000 UNIT) CAPS capsule Take 50,000 Units by mouth once a week.    . [DISCONTINUED] fluticasone (FLONASE) 50 MCG/ACT nasal spray Place 2 sprays into both nostrils daily. 1 g 0   No current facility-administered medications on file prior to visit.    ALLERGIES: Allergies  Allergen Reactions  . Tramadol Other (See Comments)    Intolerance : GI Upset    FAMILY HISTORY: Family History  Problem Relation Age of Onset  . Diabetes Mother   . Breast cancer Mother   . Other Mother  Larynx cancer  . Diabetes Maternal Aunt   . Hypertension Father   . Hypercholesterolemia Father   . Other Father        DDD  . Cancer - Other Paternal Grandmother        bone  . Colon cancer Maternal Uncle     SOCIAL HISTORY: Social History   Socioeconomic History  . Marital status: Legally Separated    Spouse name: Not on file  . Number of children: 3  . Years of education: Not on file  . Highest education level: Not on file  Occupational History  . 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 Smoking status: Never Smoker  . Smokeless tobacco: Never Used  Vaping Use  . Vaping Use: Never used  Substance and Sexual Activity  .  Alcohol use: Yes    Comment: rare  . Drug use: No  . Sexual activity: Not on file  Other Topics Concern  . Not on file  Social History Narrative   Lives with sister and her husband who smokes    Social Determinants of Health   Financial Resource Strain:   . Difficulty of Paying Living Expenses: Not on file  Food Insecurity:   . Worried About Charity fundraiser in the Last Year: Not on file  . Ran Out of Food in the Last Year: Not on file  Transportation Needs:   . Lack of Transportation (Medical): Not on file  . Lack of Transportation (Non-Medical): Not on file  Physical Activity:   . Days of Exercise per Week: Not on file  . Minutes of Exercise per Session: Not on file  Stress:   . Feeling of Stress : Not on file  Social Connections:   . Frequency of Communication with Friends and Family: Not on file  . Frequency of Social Gatherings with Friends and Family: Not on file  . Attends Religious Services: Not on file  . Active Member of Clubs or Organizations: Not on file  . Attends Archivist Meetings: Not on file  . Marital Status: Not on file  Intimate Partner Violence:   . Fear of Current or Ex-Partner: Not on file  . Emotionally Abused: Not on file  . Physically Abused: Not on file  . Sexually Abused: Not on file    PHYSICAL EXAM: Blood pressure (!) 169/82, pulse 68, height 5\' 9"  (1.753 m), weight (!) 308 lb (139.7 kg), SpO2 99 %. General: No acute distress.  Patient appears well-groomed.   Head:  Normocephalic/atraumatic Eyes:  Fundi examined but not visualized Neck: supple, no paraspinal tenderness, full range of motion Heart:  Regular rate and rhythm Lungs:  Clear to auscultation bilaterally Back: No paraspinal tenderness Neurological Exam: alert and oriented to person, place, and time. Attention span and concentration intact, recent and remote memory intact, fund of knowledge intact.  Speech fluent and not dysarthric, language intact.  CN II-XII intact.  Bulk and tone normal, muscle strength 5/5 throughout.  Sensation to pinprick reduced over anterior left ankle and dorsum of left foot; vibration intact.  Deep tendon reflexes 2+ throughout, toes downgoing.  Finger to nose and heel to shin testing intact.  Gait antalgic.  Romberg negative. Positive straight leg raise.  IMPRESSION: 1.  Acute left lumbar radiculopathy/radiculitis (possibly L5) 2.  Migraine without aura, without status migrainosus, not intractable 3.  Elevated blood pressure reading/hypertension  PLAN: 1.  Due to acute pain limiting routine daily activity, limited medication options because of  kidney disease, and failed prednisone and Flexeril, MRI of lumbar spine without contrast is indicated to evaluate for structural etiology that may respond to either epidural injections or surgery. 2. Start gabapentin 100mg  at bedtime.  Check BMP.  Caution given her kidney disease.  GFR today is 30.46.   3. Continue Flexeril as needed for muscle spasms. 4. Continue Aimovig and sumatriptan for migraine. 5. Further recommendations pending results.  Otherwise, follow up 4 to 6 months. 6. Follow up with PCP regarding blood pressure.   Metta Clines, DO  CC: [name], DO

## 2020-01-23 ENCOUNTER — Other Ambulatory Visit (INDEPENDENT_AMBULATORY_CARE_PROVIDER_SITE_OTHER): Payer: Managed Care, Other (non HMO)

## 2020-01-23 ENCOUNTER — Ambulatory Visit: Payer: Managed Care, Other (non HMO) | Admitting: Neurology

## 2020-01-23 ENCOUNTER — Encounter: Payer: Self-pay | Admitting: Neurology

## 2020-01-23 ENCOUNTER — Other Ambulatory Visit: Payer: Self-pay

## 2020-01-23 VITALS — BP 169/82 | HR 68 | Ht 69.0 in | Wt 308.0 lb

## 2020-01-23 DIAGNOSIS — G43009 Migraine without aura, not intractable, without status migrainosus: Secondary | ICD-10-CM | POA: Diagnosis not present

## 2020-01-23 DIAGNOSIS — M5416 Radiculopathy, lumbar region: Secondary | ICD-10-CM

## 2020-01-23 DIAGNOSIS — I10 Essential (primary) hypertension: Secondary | ICD-10-CM

## 2020-01-23 LAB — BASIC METABOLIC PANEL
BUN: 25 mg/dL — ABNORMAL HIGH (ref 6–23)
CO2: 27 mEq/L (ref 19–32)
Calcium: 8.7 mg/dL (ref 8.4–10.5)
Chloride: 102 mEq/L (ref 96–112)
Creatinine, Ser: 2.05 mg/dL — ABNORMAL HIGH (ref 0.40–1.20)
GFR: 30.46 mL/min — ABNORMAL LOW (ref >60.00)
Glucose, Bld: 84 mg/dL (ref 70–99)
Potassium: 3.9 mEq/L (ref 3.5–5.1)
Sodium: 137 mEq/L (ref 135–145)

## 2020-01-23 MED ORDER — GABAPENTIN 100 MG PO CAPS: 100.0000 mg | ORAL_CAPSULE | Freq: Every day | ORAL | 5 refills | Status: DC

## 2020-01-23 NOTE — Patient Instructions (Addendum)
1.  Start gabapentin 100mg  at bedtime.  Check BMP. 2.  Take cyclobenzaprine as needed for muscle spasms. 3.  Will get MRI of lumbar spine without contrast 4.  Further recommendations pending results.  Otherwise, follow up in 4 to 6 months.

## 2020-01-25 ENCOUNTER — Ambulatory Visit (INDEPENDENT_AMBULATORY_CARE_PROVIDER_SITE_OTHER): Payer: Managed Care, Other (non HMO) | Admitting: Family Medicine

## 2020-01-25 ENCOUNTER — Encounter: Payer: Self-pay | Admitting: Family Medicine

## 2020-01-25 ENCOUNTER — Other Ambulatory Visit: Payer: Self-pay

## 2020-01-25 VITALS — BP 140/90 | HR 64 | Ht 70.0 in | Wt 307.1 lb

## 2020-01-25 DIAGNOSIS — Z0000 Encounter for general adult medical examination without abnormal findings: Secondary | ICD-10-CM | POA: Diagnosis not present

## 2020-01-25 DIAGNOSIS — G8929 Other chronic pain: Secondary | ICD-10-CM

## 2020-01-25 DIAGNOSIS — Z1159 Encounter for screening for other viral diseases: Secondary | ICD-10-CM | POA: Diagnosis not present

## 2020-01-25 DIAGNOSIS — M5442 Lumbago with sciatica, left side: Secondary | ICD-10-CM

## 2020-01-25 MED ORDER — CYCLOBENZAPRINE HCL 10 MG PO TABS: 10.0000 mg | ORAL_TABLET | Freq: Three times a day (TID) | ORAL | 0 refills | Status: DC | PRN

## 2020-01-25 MED ORDER — OXYCODONE HCL 5 MG PO TABS: 5.0000 mg | ORAL_TABLET | Freq: Two times a day (BID) | ORAL | 0 refills | Status: DC | PRN

## 2020-01-25 MED ORDER — ACETAMINOPHEN 500 MG PO TABS: 500.0000 mg | ORAL_TABLET | Freq: Four times a day (QID) | ORAL | 0 refills | Status: DC | PRN

## 2020-01-25 NOTE — Assessment & Plan Note (Signed)
Obtain Hep C today.  Declines COVID and influenza vaccines at this time but will consider in the future.

## 2020-01-25 NOTE — Progress Notes (Signed)
   SUBJECTIVE:   CHIEF COMPLAINT / HPI:   Chief Complaint  Patient presents with  . Follow-up    lov     Heidi Rivera is a 54 y.o. female who presents for follow up of low back problems. Current symptoms include: pain in left lower back (aching, throbbing and tingling in character; 10/10 in severity), paresthesias in LLE and stiffness in lower back. Symptoms have worsened from the previous visit. Exacerbating factors identified by the patient are movement.  Denies perianal numbness, cancer, unexplained weight loss, immunosuppression, prolonged use of steroids, history of IV drug use, dysuria, fever, bladder or bowel incontinence and recent trauma.    PERTINENT  PMH / PSH:  reviewed and updated as appropriate   OBJECTIVE:   BP 140/90   Pulse 64   Ht 5\' 10"  (1.778 m)   Wt (!) 307 lb 2 oz (139.3 kg)   SpO2 99%   BMI 44.07 kg/m    GEN: pleasant uncomfortable appearing female CV: well perfused, distal pulses intact  RESP: no increased work of breathing MSK:  Lumbar spine: - Inspection: no gross deformity or asymmetry, swelling or ecchymosis. No skin changes - Palpation: TTP over the L4-S1 spinous processes, paraspinal muscles hypertrophied and ropy, No SI joint tenderness b/l - ROM: limited active ROM of the lumbar spine in flexion and extension secondary to  pain - Strength: 5/5 strength of lower extremities SKIN: warm, dry    ASSESSMENT/PLAN:   Healthcare maintenance Obtain Hep C today.  Declines COVID and influenza vaccines at this time but will consider in the future.   Chronic left-sided low back pain with left-sided sciatica Discussed with patient gradually returning to normal activities, as tolerated. Pt to continue ordinary activities within the limits permitted by pain.  Advised patient to avoid giving her renal function. Counseled patient on red flag symptoms and when to seek immediate care. No red flags suggesting cauda equina syndrome or progressive major  motor weakness. Follows with neurologist who has scheduled lumbar MRI later this month. Continue to stretch to help loosen muscles.   - Increase Flexeril 10 mg (muscle relaxer) to three times a day. - Trial taking Gabapentin at bedtime as prescribed by neurologist. This dose is renally adjusted.   - Start Tylenol 1000 mg three times a day. Take 1 tablet of oxycodone for severe breakthrough pain.   - Patient may benefit from OMT session    [name], DO PGY-2, [facility] 01/25/2020

## 2020-01-25 NOTE — Patient Instructions (Signed)
It was great seeing you today!   Visit Remembers: - Stop by the pharmacy to pick up your prescriptions  - Continue to work on your healthy eating habits and incorporating exercise into your daily life.  - Your goal is to have an BP < 120/80 - Medicine Changes: Increase Flexeril (muscle relaxer) to three times a day, Trial Gabapentin at bedtime  - Start Tylenol 1000 mg three times a day. Take 1 tablet of oxycodone for severe pain.  - To Do: get your MRI    Watch for worsening symptoms such as an increasing weakness or loss of sensation legs, increasing pain and the loss of bladder or bowel function. Should any of these occur, go to the emergency department immediately.   Regarding lab work today:  Due to recent changes in healthcare laws, you may see the results of your imaging and laboratory studies on MyChart before your provider has had a chance to review them.  I understand that in some cases there may be results that are confusing or concerning to you. Not all laboratory results come back in the same time frame and you may be waiting for multiple results in order to interpret others.  Please give [place] 72 hours in order for your provider to thoroughly review all the results before contacting the office for clarification of your results. If everything is normal, you will get a letter in the mail or a message in My Chart. Please give [place] a call if you do not hear from [place] after 2 weeks.  Please bring all of your medications with you to each visit.    If you haven't already, sign up for My Chart to have easy access to your labs results, and communication with your primary care physician.  Feel free to call with any questions or concerns at any time, at [phone].   Take care,  Dr. Rushie [facility]

## 2020-01-25 NOTE — Assessment & Plan Note (Signed)
Discussed with patient gradually returning to normal activities, as tolerated. Pt to continue ordinary activities within the limits permitted by pain.  Advised patient to avoid giving her renal function. Counseled patient on red flag symptoms and when to seek immediate care. No red flags suggesting cauda equina syndrome or progressive major motor weakness. Follows with neurologist who has scheduled lumbar MRI later this month. Continue to stretch to help loosen muscles.   - Increase Flexeril 10 mg (muscle relaxer) to three times a day. - Trial taking Gabapentin at bedtime as prescribed by neurologist. This dose is renally adjusted.   - Start Tylenol 1000 mg three times a day. Take 1 tablet of oxycodone for severe breakthrough pain.

## 2020-01-26 LAB — HCV AB W REFLEX TO QUANT PCR: HCV Ab: <0.1 s/co ratio (ref 0.0–0.9)

## 2020-01-26 LAB — HCV INTERPRETATION

## 2020-01-27 ENCOUNTER — Telehealth: Payer: Self-pay | Admitting: Neurology

## 2020-01-27 NOTE — Telephone Encounter (Signed)
Received prior authorization for MRI lumbar spine without contrast.  Authorization number [id number], good for 90 calendar days.

## 2020-02-02 ENCOUNTER — Telehealth: Payer: Self-pay

## 2020-02-02 NOTE — Telephone Encounter (Signed)
Received fax from pharmacy, PA needed on Oxycodone 5mg . Clinical questions submitted via Cover My Meds. Waiting on response, could take up to 72 hours.  Cover My Meds info: Key: [id number]

## 2020-02-03 ENCOUNTER — Ambulatory Visit
Admission: RE | Admit: 2020-02-03 | Discharge: 2020-02-03 | Disposition: A | Discharge status: Home / Self Care | Payer: Managed Care, Other (non HMO) | Source: Ambulatory Visit | Attending: Neurology | Admitting: Neurology

## 2020-02-03 ENCOUNTER — Other Ambulatory Visit: Payer: Self-pay

## 2020-02-03 DIAGNOSIS — M5416 Radiculopathy, lumbar region: Secondary | ICD-10-CM

## 2020-02-06 ENCOUNTER — Telehealth: Payer: Self-pay

## 2020-02-06 DIAGNOSIS — M5416 Radiculopathy, lumbar region: Secondary | ICD-10-CM

## 2020-02-06 NOTE — Telephone Encounter (Signed)
-----   Message from Pieter Partridge, DO sent at 02/03/2020  4:37 PM EDT ----- MRI shows a small disc bulge but nothing that would be pinching the nerve.  At this point I would continue gabapentin.  We can refer her to physical therapy if she is agreeable.

## 2020-02-06 NOTE — Telephone Encounter (Signed)
Pt advised of MRI results. Order for Pt added per pt and DR. Tomi Likens

## 2020-02-14 ENCOUNTER — Other Ambulatory Visit: Payer: Self-pay

## 2020-02-14 ENCOUNTER — Ambulatory Visit: Payer: Managed Care, Other (non HMO) | Attending: Neurology | Admitting: Physical Therapy

## 2020-02-14 ENCOUNTER — Encounter: Payer: Self-pay | Admitting: Physical Therapy

## 2020-02-14 DIAGNOSIS — M5442 Lumbago with sciatica, left side: Secondary | ICD-10-CM | POA: Diagnosis present

## 2020-02-14 DIAGNOSIS — M62830 Muscle spasm of back: Secondary | ICD-10-CM | POA: Diagnosis present

## 2020-02-14 DIAGNOSIS — M5416 Radiculopathy, lumbar region: Secondary | ICD-10-CM

## 2020-02-15 ENCOUNTER — Encounter: Payer: Self-pay | Admitting: Physical Therapy

## 2020-02-15 NOTE — Therapy (Signed)
[place], [place], [address] Phone: [phone]   Fax:  [phone]  Physical Therapy Evaluation  Patient Details  Name: Heidi Rivera MRN: [id number] Date of Birth: [birth year] Referring Provider (PT): Dr Pleas Koch    Encounter Date: 02/14/2020   PT End of Session - 02/14/20 1603    Visit Number 1    Number of Visits 6    Date for PT Re-Evaluation 03/27/20    Authorization Type [id number] dollar co-pay    PT Start Time 1545    PT Stop Time 1630    PT Time Calculation (min) 45 min    Activity Tolerance Patient tolerated treatment well    Behavior During Therapy [facility] for tasks assessed/performed           Past Medical History:  Diagnosis Date  . Acute gastritis   . Gout   . Hypertension   . Migraines   . Obesity   . Vertigo     Past Surgical History:  Procedure Laterality Date  . HEMORRHOID SURGERY      There were no vitals filed for this visit.    Subjective Assessment - 02/14/20 1551    Subjective Patient had an acute onset of lewer back pain with left radiculopathy in August of 2021. At the time the pain was intracatable but has improved since that point. She contineus to have pain down her leg    Pertinent History kidney disrease    Limitations Sitting    How long can you sit comfortably? Stiffens when she sits at work    How long can you stand comfortably? 10 minutes before she has pain    How long can you walk comfortably? has pain walking around the grocery store    Diagnostic tests MRI: small disc bulge in the back    Patient Stated Goals to have less pain    Currently in Pain? Yes    Pain Score 5     Pain Location Back    Pain Orientation Left    Pain Descriptors / Indicators Aching    Pain Type Acute pain    Pain Radiating Towards radiating into her left leg    Aggravating Factors  standing to cook, Bending to pick up objects    Pain Relieving Factors Sitting but  after a while it sitffens,    Effect of Pain on Daily Activities difficulty standing to perfrom ADL's              [facility] PT Assessment - 02/15/20 0001      Assessment   Medical Diagnosis Low back pain with left radiculopathy     Referring Provider (PT) Dr Pleas Koch     Hand Dominance Right    Next MD Visit March 2022     Prior Therapy None       Precautions   Precautions None      Restrictions   Weight Bearing Restrictions No      Balance Screen   Has the patient fallen in the past 6 months No    Has the patient had a decrease in activity level because of a fear of falling?  No    Is the patient reluctant to leave their home because of a fear of falling?  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    Leisure Walking, water aerobics, dancing       Cognition   Overall Cognitive Status Within Functional Limits for tasks assessed    Attention Focused    Focused Attention Appears intact    Memory Appears intact    Awareness Appears intact    Problem Solving Appears intact      Observation/Other Assessments   Focus on Therapeutic Outcomes (FOTO)  66% limitation       Sensation   Light Touch Appears Intact      Coordination   Gross Motor Movements are Fluid and Coordinated Yes    Fine Motor Movements are Fluid and Coordinated Yes      AROM   AROM Assessment Site Lumbar    Lumbar Flexion 30 degrees with pain     Lumbar Extension no pain     Lumbar - Left Side Bend pain     Lumbar - Left Rotation pain       PROM   PROM Assessment Site Hip    Right/Left Hip Right;Left    Left Hip Flexion 40 with pain       Strength   Strength Assessment Site Hip;Knee    Right/Left Hip Right;Left    Right Hip Flexion 5/5    Left Hip Flexion 4/5    Left Hip ABduction 4/5    Left Hip ADduction 4/5    Right/Left  Knee Right;Left    Right Knee Flexion 5/5    Right Knee Extension 5/5    Left Knee Flexion 5/5    Left Knee Extension 5/5      Palpation   Palpation comment significant spasming in the left lumbar paraspinal and left gluteal       Special Tests   Other special tests SLR (+) left at 35 degrees       Transfers   Comments slowtransfer from prone to supine       Ambulation/Gait   Gait Comments decreased hip flexion bilateral                       Objective measurements completed on examination: See above findings.       [place] [facility] PT Treatment/Exercise - 02/15/20 0001      Lumbar Exercises: Stretches   Lower Trunk Rotation Limitations x10     Press Ups Limitations prone on elbows     Other Lumbar Stretch Exercise heel slide with towel for hip flexion     Other Lumbar Stretch Exercise tennis ball trigger point release       Manual Therapy   Manual Therapy Manual Traction    Manual Traction attmepted gentle LAD to improve hip flexion but patient had increased pain                   PT Education - 02/14/20 1604    Education Details reviewed FOTO    Person(s) Educated Patient    Methods Explanation;Verbal cues;Tactile cues;Demonstration    Comprehension Verbalized understanding;Returned demonstration;Verbal cues required;Tactile cues required            PT Short Term Goals - 02/14/20 1729      PT SHORT TERM GOAL #1   Title Pateint will increase passive left hip flexion to 90 degres    Time 6    Period Weeks    Status New    Target Date 03/27/20  PT SHORT TERM GOAL #2   Title Patient will report centrialzed LBP <3/10    Time 3    Period Weeks    Status New    Target Date 03/06/20      PT SHORT TERM GOAL #3   Title Patient will increase left hip flexion strength to 4+/5    Time 3    Period Weeks    Status New    Target Date 03/06/20             PT Long Term Goals - 02/14/20 1737      PT LONG TERM GOAL #1   Title Patient  will stand to wash dishes for 20 minutes without pain    Time 6    Period Weeks    Status New    Target Date 03/27/20      PT LONG TERM GOAL #2   Title Patient will go up and down 6 steps without increasaed pain in her lowr back    Time 6    Period Weeks    Status New    Target Date 03/27/20      PT LONG TERM GOAL #3   Title Patient will go shopping wihtout self reported pain in her lower back    Time 6    Period Weeks    Status New    Target Date 03/27/20                  Plan - 02/14/20 1604    Clinical Impression Statement Patient is a 54 year old female with an acute onset of left sided low back pain with left radiculopathy. Her pain can go down into her left foot. She has significant spasming in her left paraspinals and into her left gluteal. She has increased pain with flexion. She had no pain with extension. She alos has pain with left sidebend and rotation. Signs and symptoms are consitent with left sided disc dysfunction. She would benefit from skilled therapy to reduce spasming and improve lumbar mobility.    Personal Factors and Comorbidities Comorbidity 1;Fitness;Profession    Comorbidities kidney disease which casues her to retain fluid,    Examination-Activity Limitations Sit;Squat;Lift;Stand;Bend;Locomotion Level;Transfers    Examination-Participation Restrictions Driving;Cleaning;Occupation;Meal Prep    Stability/Clinical Decision Making Evolving/Moderate complexity    Clinical Decision Making Moderate    Rehab Potential Good    PT Frequency 1x / week   high co-pay   PT Duration 6 weeks    PT Treatment/Interventions ADLs/Self Care Home Management;Electrical Stimulation;Cryotherapy;Iontophoresis 4mg /ml Dexamethasone;Moist Heat;DME Instruction;Functional mobility training;Therapeutic activities;Therapeutic exercise;Neuromuscular re-education;Patient/family education;Manual techniques;Dry needling;Taping    PT Next Visit Plan trigger point dry needling;  manual  soft tissue mobilization; manual therapy to the left hip; begin vgentle core strengthening; review prone positioning.    PT Home Exercise Plan piriformis stretch, tennis ball trigger point release, LTR, prone on elbows    Consulted and Agree with Plan of Care Patient           Patient will benefit from skilled therapeutic intervention in order to improve the following deficits and impairments:  Abnormal gait, Difficulty walking, Decreased range of motion, Pain, Increased muscle spasms, Decreased activity tolerance, Decreased strength, Decreased endurance, Decreased mobility  Visit Diagnosis: Acute left-sided low back pain with left-sided sciatica  Radiculopathy, lumbar region  Muscle spasm of back     Problem List Patient Active Problem List   Diagnosis Date Noted  . Laryngitis 10/13/2019  . Healthcare maintenance 07/15/2019  .  Stress incontinence 06/17/2019  . Costochondritis, acute 06/17/2019  . Migraine without aura 07/11/2008  . CHRONIC KIDNEY DISEASE STAGE III (MODERATE) 07/11/2008  . ESSENTIAL HYPERTENSION, BENIGN 08/30/2007  . OBESITY, NOS 07/23/2006  . Chronic left-sided low back pain with left-sided sciatica 07/23/2006    Carney Living PT DPT  02/15/2020, 12:51 PM  [facility] [address] [place], [place], [address] Phone: [phone]   Fax:  [phone]  Name: Heidi Rivera MRN: [id number] Date of Birth: [birth year]

## 2020-02-15 NOTE — Patient Instructions (Signed)
Access Code: [email or web address] by: Shanon Brow CarrollExercises  Standing Glute Med Mobilization with [employer] on Wall - 1 x daily - 7 x weekly - 3 sets - 10 reps  Supine Lower Trunk Rotation - 1 x daily - 7 x weekly - 3 sets - 10 reps  Cervical Retraction Prone on Elbows - 1 x daily - 7 x weekly - 3 sets - 10 reps  Supine Heel Slide with Strap - 1 x daily - 7 x weekly - 3 sets - 10 reps - 5 sec hold hold

## 2020-02-16 ENCOUNTER — Other Ambulatory Visit: Payer: Managed Care, Other (non HMO)

## 2020-02-19 ENCOUNTER — Other Ambulatory Visit: Payer: Self-pay

## 2020-02-19 ENCOUNTER — Ambulatory Visit (HOSPITAL_COMMUNITY)
Admission: EM | Admit: 2020-02-19 | Discharge: 2020-02-19 | Disposition: A | Discharge status: Home / Self Care | Payer: Managed Care, Other (non HMO) | Attending: Urgent Care | Admitting: Urgent Care

## 2020-02-19 ENCOUNTER — Ambulatory Visit (INDEPENDENT_AMBULATORY_CARE_PROVIDER_SITE_OTHER): Payer: Managed Care, Other (non HMO)

## 2020-02-19 ENCOUNTER — Encounter (HOSPITAL_COMMUNITY): Payer: Self-pay | Admitting: Emergency Medicine

## 2020-02-19 DIAGNOSIS — M25572 Pain in left ankle and joints of left foot: Secondary | ICD-10-CM

## 2020-02-19 DIAGNOSIS — M533 Sacrococcygeal disorders, not elsewhere classified: Secondary | ICD-10-CM | POA: Diagnosis not present

## 2020-02-19 DIAGNOSIS — W19XXXA Unspecified fall, initial encounter: Secondary | ICD-10-CM | POA: Diagnosis not present

## 2020-02-19 DIAGNOSIS — M7918 Myalgia, other site: Secondary | ICD-10-CM | POA: Diagnosis not present

## 2020-02-19 DIAGNOSIS — M545 Low back pain: Secondary | ICD-10-CM

## 2020-02-19 DIAGNOSIS — S93402A Sprain of unspecified ligament of left ankle, initial encounter: Secondary | ICD-10-CM

## 2020-02-19 NOTE — ED Triage Notes (Addendum)
Fell on steps Friday morning.  Patient turned, slipped and landed on buttocks.  Patient reports hearing a pop in left ankle.    Tail bone pain, lower back pain.  Patient does have a bulging disc diagnosed in ED recently.  Right ankle is painful, but can bear weight.  Left ankle is swollen and more painful than right ankle.  Left pedal pulse 2 +.  Patient has been soaking, icing sore places.  Patient is able to wiggle toes

## 2020-02-19 NOTE — ED Provider Notes (Signed)
[place]   MRN: [id number] DOB: 1965-10-06  Subjective:   Heidi Rivera is a 54 y.o. female presenting for suffering an accidental fall 2 days ago. Patient states that she was outside in somehow slipped on the front steps, fell very quickly and knows that she landed on her buttock but also had her leg twisted and felt a pop in her left ankle. She has since had significant difficulty bearing weight on her left ankle with the worst of her pain medially. She has also had significant buttock pain. Landed on concrete and is worried about fracture. She is been prescribed oxycodone, muscle relaxants given recent diagnosis of bulging disc. Has been using these medications together with Tylenol.  No current facility-administered medications for this encounter.  Current Outpatient Medications:    amLODipine (NORVASC) 10 MG tablet, Take 1 tablet (10 mg total) by mouth at bedtime. (Patient taking differently: Take 10 mg by mouth at bedtime. Pt takes it in the morning.), Disp: 90 tablet, Rfl: 3   cyclobenzaprine (FLEXERIL) 10 MG tablet, Take 1 tablet (10 mg total) by mouth 3 (three) times daily as needed for muscle spasms., Disp: 20 tablet, Rfl: 0   furosemide (LASIX) 40 MG tablet, Take 40 mg by mouth daily., Disp: , Rfl:    losartan (COZAAR) 50 MG tablet, Take 0.5 tablets (25 mg total) by mouth at bedtime. (Patient taking differently: Take 150 mg by mouth daily. 50 mg in the morning , and 100 mg in the evening), Disp: 30 tablet, Rfl: 2   oxyCODONE (ROXICODONE) 5 MG immediate release tablet, Take 1 tablet (5 mg total) by mouth every 12 (twelve) hours as needed for severe pain., Disp: 30 tablet, Rfl: 0   acetaminophen (TYLENOL) 500 MG tablet, Take 1 tablet (500 mg total) by mouth every 6 (six) hours as needed., Disp: 30 tablet, Rfl: 0   albuterol (PROVENTIL HFA;VENTOLIN HFA) 108 (90 BASE) MCG/ACT inhaler, Inhale 2 puffs into the lungs every 4 (four) hours as needed for  wheezing or shortness of breath., Disp: 1 Inhaler, Rfl: 0   Blood Pressure Monitoring (BLOOD PRESSURE CUFF) MISC, 1 Device by Does not apply route daily., Disp: 1 each, Rfl: 0   Erenumab-aooe (AIMOVIG) 70 MG/ML SOAJ, Inject 70 mg into the skin every 28 (twenty-eight) days., Disp: 1 pen, Rfl: 11   gabapentin (NEURONTIN) 100 MG capsule, Take 1 capsule (100 mg total) by mouth at bedtime. (Patient not taking: Reported on 02/14/2020), Disp: 30 capsule, Rfl: 5   SUMAtriptan (IMITREX) 100 MG tablet, Take 1 tablet earliest onset of migraine.  May repeat in 2 hours if headache persists or recurs.  Maximum 2 tablets in 24 hours., Disp: 10 tablet, Rfl: 3   Vitamin D, Ergocalciferol, (DRISDOL) 1.25 MG (50000 UNIT) CAPS capsule, Take 50,000 Units by mouth once a week., Disp: , Rfl:    Allergies  Allergen Reactions   Tramadol Other (See Comments)    Intolerance : GI Upset    Past Medical History:  Diagnosis Date   Acute gastritis    Gout    Hypertension    Migraines    Obesity    Vertigo      Past Surgical History:  Procedure Laterality Date   HEMORRHOID SURGERY      Family History  Problem Relation Age of Onset   Diabetes Mother    Breast cancer Mother    Other Mother        Larynx cancer   Diabetes Maternal  Aunt    Hypertension Father    Hypercholesterolemia Father    Other Father        DDD   Cancer - Other Paternal Grandmother        bone   Colon cancer Maternal Uncle     Social History   Tobacco Use   Smoking status: Never Smoker   Smokeless tobacco: Never Used  Vaping Use   Vaping Use: Never used  Substance Use Topics   Alcohol use: Yes    Comment: rare   Drug use: No    ROS   Objective:   Vitals: BP (!) 140/94 (BP Location: Right Arm) Comment (BP Location): large cuff   Pulse 72    Temp 98.7 F (37.1 C) (Oral)    Resp 20    SpO2 96%   Physical Exam Constitutional:      General: She is not in acute distress.    Appearance: Normal  appearance. She is well-developed. She is obese. She is not ill-appearing, toxic-appearing or diaphoretic.  HENT:     Head: Normocephalic and atraumatic.     Nose: Nose normal.     Mouth/Throat:     Mouth: Mucous membranes are moist.     Pharynx: Oropharynx is clear.  Eyes:     General: No scleral icterus.       Right eye: No discharge.        Left eye: No discharge.     Extraocular Movements: Extraocular movements intact.     Conjunctiva/sclera: Conjunctivae normal.     Pupils: Pupils are equal, round, and reactive to light.  Cardiovascular:     Rate and Rhythm: Normal rate.  Pulmonary:     Effort: Pulmonary effort is normal.  Musculoskeletal:       Back:     Left ankle: Swelling present. No deformity, ecchymosis or lacerations. Tenderness present over the lateral malleolus and medial malleolus. No posterior TF ligament, base of 5th metatarsal or proximal fibula tenderness. Decreased range of motion.  Skin:    General: Skin is warm and dry.  Neurological:     General: No focal deficit present.     Mental Status: She is alert and oriented to person, place, and time.  Psychiatric:        Mood and Affect: Mood normal.        Behavior: Behavior normal.        Thought Content: Thought content normal.        Judgment: Judgment normal.     DG Sacrum/Coccyx  Result Date: 02/19/2020 CLINICAL DATA:  Pain. EXAM: SACRUM AND COCCYX - 2+ VIEW COMPARISON:  None. FINDINGS: There is no evidence of fracture or other focal bone lesions. IMPRESSION: Negative. Electronically Signed   By: Constance Holster M.D.   On: 02/19/2020 17:03   DG Ankle Complete Left  Result Date: 02/19/2020 CLINICAL DATA:  Pain EXAM: LEFT ANKLE COMPLETE - 3+ VIEW COMPARISON:  None. FINDINGS: There is soft tissue swelling about the ankle. There osseous fragments adjacent to the medial and lateral malleoli that appear well corticated and are likely related to an old remote injury. There is no definite acute displaced  fracture or dislocation on this study. There are mild degenerative changes of the midfoot. No radiopaque foreign body. IMPRESSION: 1. Soft tissue swelling about the ankle without evidence for an acute displaced fracture or dislocation. 2. Probable old remote avulsion fractures involving the medial and lateral malleoli. Electronically Signed   By: Jamie Kato.D.  On: 02/19/2020 17:04   Left ankle wrapped using 4" Ace wrap in figure-8 method.   Assessment and Plan :   PDMP not reviewed this encounter.  1. Acute left ankle pain   2. Acute buttock pain   3. Accidental fall, initial encounter   4. Coccyx pain   5. Sprain of left ankle, unspecified ligament, initial encounter     Will manage for ankle sprain coccyx pain/contusion related to the fall with rice method, NSAID. Counseled patient on potential for adverse effects with medications prescribed/recommended today, ER and return-to-clinic precautions discussed, patient verbalized understanding.    Jaynee Eagles, PA-C 02/19/20 1710

## 2020-02-26 NOTE — Progress Notes (Deleted)
Cardiology Cardiology Note    Date:  02/26/2020   ID:  Heidi Rivera, DOB 1966/04/10, MRN [id number]  PCP:  [name], DO  Cardiologist:  Fransico Him, MD   No chief complaint on file.   History of Present Illness:  Heidi Rivera is a 54 y.o. female who is being seen today for the evaluation of chest pain at the request of Janalee Dane, [place]*.  This is a 54yo AAF with a hx of CKD stage 3, HTN and migraine HAs who is referred for evaluation of chest pain.    Past Medical History:  Diagnosis Date  . Acute gastritis   . CHRONIC KIDNEY DISEASE STAGE III (MODERATE) 07/11/2008   Qualifier: Diagnosis of  By: Tye Savoy MD, Tommi Rumps    . Chronic left-sided low back pain with left-sided sciatica 07/23/2006   Qualifier: Diagnosis of  By: Herma Ard    . Costochondritis, acute 06/17/2019  . Essential hypertension, benign 08/30/2007   Qualifier: Diagnosis of  By: Darylene Price MD, Elta Guadeloupe    . Gout   . Hypertension   . Laryngitis 10/13/2019  . Migraine without aura 07/11/2008   Qualifier: Diagnosis of  By: Carlena Sax  MD, Colletta Maryland    . Migraines   . Obesity   . OBESITY, NOS 07/23/2006   Qualifier: Diagnosis of  By: Herma Ard    . Stress incontinence 06/17/2019  . Vertigo     Past Surgical History:  Procedure Laterality Date  . HEMORRHOID SURGERY      Current Medications: No outpatient medications have been marked as taking for the 02/27/20 encounter (Appointment) with Sueanne Margarita, MD.    Allergies:   Tramadol   Social History   Socioeconomic History  . Marital status: Legally Separated    Spouse name: Not on file  . Number of children: 3  . Years of education: Not on file  . Highest education level: Not on file  Occupational History  . 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 Smoking status: Never Smoker  . Smokeless tobacco: Never Used  Vaping Use  . Vaping Use: Never used  Substance and Sexual Activity  . Alcohol use: Yes    Comment:  rare  . Drug use: No  . Sexual activity: Not on file  Other Topics Concern  . Not on file  Social History Narrative   Lives with sister and her husband who smokes    Social Determinants of Health   Financial Resource Strain:   . Difficulty of Paying Living Expenses: Not on file  Food Insecurity:   . Worried About Charity fundraiser in the Last Year: Not on file  . Ran Out of Food in the Last Year: Not on file  Transportation Needs:   . Lack of Transportation (Medical): Not on file  . Lack of Transportation (Non-Medical): Not on file  Physical Activity:   . Days of Exercise per Week: Not on file  . Minutes of Exercise per Session: Not on file  Stress:   . Feeling of Stress : Not on file  Social Connections:   . Frequency of Communication with Friends and Family: Not on file  . Frequency of Social Gatherings with Friends and Family: Not on file  . Attends Religious Services: Not on file  . Active Member of Clubs or Organizations: Not on file  . Attends Archivist Meetings: Not on file  . Marital Status: Not on file  Family History:  The patient's family history includes Breast cancer in her mother; Cancer - Other in her paternal grandmother; Colon cancer in her maternal uncle; Diabetes in her maternal aunt and mother; Hypercholesterolemia in her father; Hypertension in her father; Other in her father and mother.   ROS:   Please see the history of present illness.    ROS All other systems reviewed and are negative.  No flowsheet data found.     PHYSICAL EXAM:   VS:  There were no vitals taken for this visit.   GEN: Well nourished, well developed, in no acute distress  HEENT: normal  Neck: no JVD, carotid bruits, or masses Cardiac: RRR; no murmurs, rubs, or gallops,no edema.  Intact distal pulses bilaterally.  Respiratory:  clear to auscultation bilaterally, normal work of breathing GI: soft, nontender, nondistended, + BS MS: no deformity or atrophy    Skin: warm and dry, no rash Neuro:  Alert and Oriented x 3, Strength and sensation are intact Psych: euthymic mood, full affect  Wt Readings from Last 3 Encounters:  01/25/20 (!) 307 lb 2 oz (139.3 kg)  01/23/20 (!) 308 lb (139.7 kg)  01/13/20 (!) 306 lb (138.8 kg)      Studies/Labs Reviewed:   EKG:  EKG is ordered today.  The ekg ordered today demonstrates ***  Recent Labs: 06/15/2019: Hemoglobin 12.5; Platelets 205 01/23/2020: BUN 25; Creatinine, Ser 2.05; Potassium 3.9; Sodium 137   Lipid Panel    Component Value Date/Time   CHOL 198 07/20/2019 1616   TRIG 106 07/20/2019 1616   HDL 60 07/20/2019 1616   CHOLHDL 3.3 07/20/2019 1616   CHOLHDL 3.7 Ratio 09/02/2007 1339   VLDL 18 09/02/2007 1339   LDLCALC 119 (H) 07/20/2019 1616    Additional studies/ records that were reviewed today include:  EKG and OV notes from PCP    ASSESSMENT:    1. Chest pain of uncertain etiology   2. Essential hypertension, benign      PLAN:  In order of problems listed above:  1. Chest pain    Medication Adjustments/Labs and Tests Ordered: Current medicines are reviewed at length with the patient today.  Concerns regarding medicines are outlined above.  Medication changes, Labs and Tests ordered today are listed in the Patient Instructions below.  There are no Patient Instructions on file for this visit.   Signed, Fransico Him, MD  02/26/2020 4:53 PM    [facility] [place], [place], [place]  [address] Phone: [phone]; Fax: [phone]

## 2020-02-27 ENCOUNTER — Ambulatory Visit: Payer: Managed Care, Other (non HMO) | Admitting: Cardiology

## 2020-03-01 ENCOUNTER — Other Ambulatory Visit: Payer: Self-pay

## 2020-03-01 ENCOUNTER — Ambulatory Visit: Payer: Managed Care, Other (non HMO) | Attending: Neurology | Admitting: Physical Therapy

## 2020-03-01 ENCOUNTER — Encounter: Payer: Self-pay | Admitting: Family Medicine

## 2020-03-01 ENCOUNTER — Encounter: Payer: Self-pay | Admitting: Physical Therapy

## 2020-03-01 DIAGNOSIS — M5416 Radiculopathy, lumbar region: Secondary | ICD-10-CM | POA: Diagnosis present

## 2020-03-01 DIAGNOSIS — M5442 Lumbago with sciatica, left side: Secondary | ICD-10-CM | POA: Diagnosis present

## 2020-03-01 DIAGNOSIS — M62830 Muscle spasm of back: Secondary | ICD-10-CM | POA: Diagnosis present

## 2020-03-01 NOTE — Therapy (Signed)
[place], [place], [address] Phone: [phone]   Fax:  [phone]  Physical Therapy Treatment  Patient Details  Name: Heidi Rivera MRN: [id number] Date of Birth: 04-14-1966 Referring Provider (PT): Dr Pleas Koch    Encounter Date: 03/01/2020   PT End of Session - 03/01/20 1517    Visit Number 2    Number of Visits 6    Authorization Type [id number] dollar co-pay    PT Start Time 1330    PT Stop Time 1414    PT Time Calculation (min) 44 min    Activity Tolerance Patient tolerated treatment well    Behavior During Therapy [facility] for tasks assessed/performed           Past Medical History:  Diagnosis Date  . Acute gastritis   . CHRONIC KIDNEY DISEASE STAGE III (MODERATE) 07/11/2008   Qualifier: Diagnosis of  By: Tye Savoy MD, Tommi Rumps    . Chronic left-sided low back pain with left-sided sciatica 07/23/2006   Qualifier: Diagnosis of  By: Herma Ard    . Costochondritis, acute 06/17/2019  . Essential hypertension, benign 08/30/2007   Qualifier: Diagnosis of  By: Darylene Price MD, Elta Guadeloupe    . Gout   . Hypertension   . Laryngitis 10/13/2019  . Migraine without aura 07/11/2008   Qualifier: Diagnosis of  By: Carlena Sax  MD, Colletta [place]    . Migraines   . Obesity   . OBESITY, NOS 07/23/2006   Qualifier: Diagnosis of  By: Herma Ard    . Stress incontinence 06/17/2019  . Vertigo     Past Surgical History:  Procedure Laterality Date  . HEMORRHOID SURGERY      There were no vitals filed for this visit.   Subjective Assessment - 03/01/20 1513    Subjective Patient had a fall on 02/19/2020. She hurt her left ankle. After her intial eval she had had some imporvement in her back. Over the past few days the patient had increased pain in her left hip and back. She is putting decreased weight on the left ankle.    Pertinent History kidney disrease    Limitations Sitting    How long can you sit comfortably?  Stiffens when she sits at work    How long can you stand comfortably? 10 minutes before she has pain    How long can you walk comfortably? has pain walking around the grocery store    Diagnostic tests MRI: small disc bulge in the back    Patient Stated Goals to have less pain    Currently in Pain? Yes    Pain Score 6     Pain Location Back    Pain Orientation Left    Pain Descriptors / Indicators Aching    Pain Type Chronic pain    Pain Onset More than a month ago    Pain Frequency Constant    Aggravating Factors  standing and walking    Pain Relieving Factors sitting stiffens her back    Effect of Pain on Daily Activities difficulty standing    Multiple Pain Sites Yes    Pain Score 3    Pain Location Ankle    Pain Orientation Left    Pain Descriptors / Indicators Aching    Pain Type Chronic pain    Pain Onset More than a month ago    Pain Frequency Constant    Aggravating Factors  standing and walking    Pain Relieving Factors rest  [facility] Adult PT Treatment/Exercise - 03/01/20 0001      Self-Care   Self-Care ADL's;Other Self-Care Comments    Other Self-Care Comments  use of an ASO and where to purchase, use of thera-cane and where to purchase       Lumbar Exercises: Stretches   Lower Trunk Rotation Limitations x10     Other Lumbar Stretch Exercise seated hamstrring stretch 3x20 sec hold bilateral       Manual Therapy   Manual Therapy Soft tissue mobilization    Soft tissue mobilization trigger point release to gluteal and lumbar spine with roller and with therapist mobilization     Manual Traction able to tolerate grade 3 left and right                   PT Education - 03/01/20 1517    Education Details reviewed bracing for the ankle; light stretching of the ankle    Person(s) Educated Patient    Methods Explanation;Demonstration;Tactile cues;Verbal cues    Comprehension Verbalized understanding;Tactile cues  required;Returned demonstration;Verbal cues required            PT Short Term Goals - 02/14/20 1729      PT SHORT TERM GOAL #1   Title Pateint will increase passive left hip flexion to 90 degres    Time 6    Period Weeks    Status New    Target Date 03/27/20      PT SHORT TERM GOAL #2   Title Patient will report centrialzed LBP <3/10    Time 3    Period Weeks    Status New    Target Date 03/06/20      PT SHORT TERM GOAL #3   Title Patient will increase left hip flexion strength to 4+/5    Time 3    Period Weeks    Status New    Target Date 03/06/20             PT Long Term Goals - 02/14/20 1737      PT LONG TERM GOAL #1   Title Patient will stand to wash dishes for 20 minutes without pain    Time 6    Period Weeks    Status New    Target Date 03/27/20      PT LONG TERM GOAL #2   Title Patient will go up and down 6 steps without increasaed pain in her lowr back    Time 6    Period Weeks    Status New    Target Date 03/27/20      PT LONG TERM GOAL #3   Title Patient will go shopping wihtout self reported pain in her lower back    Time 6    Period Weeks    Status New    Target Date 03/27/20                 Plan - 03/01/20 1518    Clinical Impression Statement Therapy focused on light stretching and trigger point release to the lumbar spine. She reported a minor improvement with treatment. Her ankle has mild swelling but per visual inspection is lacking DF. It is effecting her gait which is likley contributing to her new spasming in her hip and back. Therapy adivsed a lce up ASO when she is working and light DF stretching. Therapy also reviewed how to use a thera-cane for her back.    Personal Factors and Comorbidities Comorbidity 1;Fitness;Profession  Comorbidities kidney disease which casues her to retain fluid,    Examination-Activity Limitations Sit;Squat;Lift;Stand;Bend;Locomotion Level;Transfers    Examination-Participation Restrictions  Driving;Cleaning;Occupation;Meal Prep    Stability/Clinical Decision Making Evolving/Moderate complexity    Clinical Decision Making Moderate    Rehab Potential Good    PT Frequency 1x / week    PT Duration 6 weeks    PT Treatment/Interventions ADLs/Self Care Home Management;Electrical Stimulation;Cryotherapy;Iontophoresis 4mg /ml Dexamethasone;Moist Heat;DME Instruction;Functional mobility training;Therapeutic activities;Therapeutic exercise;Neuromuscular re-education;Patient/family education;Manual techniques;Dry needling;Taping    PT Next Visit Plan trigger point dry needling;  manual soft tissue mobilization; manual therapy to the left hip; begin vgentle core strengthening; review prone positioning.    PT Home Exercise Plan piriformis stretch, tennis ball trigger point release, LTR, prone on elbows    Consulted and Agree with Plan of Care Patient           Patient will benefit from skilled therapeutic intervention in order to improve the following deficits and impairments:  Abnormal gait, Difficulty walking, Decreased range of motion, Pain, Increased muscle spasms, Decreased activity tolerance, Decreased strength, Decreased endurance, Decreased mobility  Visit Diagnosis: Acute left-sided low back pain with left-sided sciatica  Radiculopathy, lumbar region  Muscle spasm of back     Problem List Patient Active Problem List   Diagnosis Date Noted  . Laryngitis 10/13/2019  . Healthcare maintenance 07/15/2019  . Stress incontinence 06/17/2019  . Costochondritis, acute 06/17/2019  . Migraine without aura 07/11/2008  . CHRONIC KIDNEY DISEASE STAGE III (MODERATE) 07/11/2008  . ESSENTIAL HYPERTENSION, BENIGN 08/30/2007  . OBESITY, NOS 07/23/2006  . Chronic left-sided low back pain with left-sided sciatica 07/23/2006    Carney Living PT DPT  03/01/2020, 3:26 PM  [place], [place], [address] Phone:  [phone]   Fax:  [phone]  Name: Heidi Rivera MRN: [id number] Date of Birth: Oct 20, 1965

## 2020-03-06 ENCOUNTER — Encounter: Payer: Self-pay | Admitting: Neurology

## 2020-03-06 NOTE — Progress Notes (Addendum)
Heidi Rivera (Key: [id number]) Aimovig 70MG /ML auto-injectors   Form Librarian, academic PA Form [phone] NCPDP) Created 7 days ago Sent to Plan 6 days ago Plan Response 6 days ago Submit Clinical Questions 6 days ago Determination Favorable 1 minute ago Message from Plan CaseId:[id number];Status:Approved;Review Type:Prior Auth;Coverage Start Date:03/06/2020;Coverage End Date:03/12/2021;

## 2020-03-08 ENCOUNTER — Ambulatory Visit: Payer: Managed Care, Other (non HMO) | Admitting: Physical Therapy

## 2020-03-15 ENCOUNTER — Encounter: Payer: Self-pay | Admitting: Physical Therapy

## 2020-03-15 ENCOUNTER — Ambulatory Visit: Payer: Managed Care, Other (non HMO) | Admitting: Physical Therapy

## 2020-03-15 ENCOUNTER — Other Ambulatory Visit: Payer: Self-pay

## 2020-03-15 DIAGNOSIS — M62830 Muscle spasm of back: Secondary | ICD-10-CM

## 2020-03-15 DIAGNOSIS — M5416 Radiculopathy, lumbar region: Secondary | ICD-10-CM

## 2020-03-15 DIAGNOSIS — M5442 Lumbago with sciatica, left side: Secondary | ICD-10-CM

## 2020-03-16 NOTE — Therapy (Signed)
[place], [place], [address] Phone: [phone]   Fax:  [phone]  Physical Therapy Treatment  Patient Details  Name: Heidi Rivera MRN: [id number] Date of Birth: [birth year] Referring Provider (PT): Dr Pleas Koch    Encounter Date: 03/15/2020   PT End of Session - 03/16/20 0843    Visit Number 3    Number of Visits 6    Date for PT Re-Evaluation 03/27/20    Authorization Type [id number] dollar co-pay    PT Start Time 1630    PT Stop Time 1710    PT Time Calculation (min) 40 min    Activity Tolerance Patient tolerated treatment well    Behavior During Therapy [facility] for tasks assessed/performed           Past Medical History:  Diagnosis Date  . Acute gastritis   . CHRONIC KIDNEY DISEASE STAGE III (MODERATE) 07/11/2008   Qualifier: Diagnosis of  By: Tye Savoy MD, Tommi Rumps    . Chronic left-sided low back pain with left-sided sciatica 07/23/2006   Qualifier: Diagnosis of  By: Herma Ard    . Costochondritis, acute 06/17/2019  . Essential hypertension, benign 08/30/2007   Qualifier: Diagnosis of  By: Darylene Price MD, Elta Guadeloupe    . Gout   . Hypertension   . Laryngitis 10/13/2019  . Migraine without aura 07/11/2008   Qualifier: Diagnosis of  By: Carlena Sax  MD, Colletta [place]    . Migraines   . Obesity   . OBESITY, NOS 07/23/2006   Qualifier: Diagnosis of  By: Herma Ard    . Stress incontinence 06/17/2019  . Vertigo     Past Surgical History:  Procedure Laterality Date  . HEMORRHOID SURGERY      There were no vitals filed for this visit.   Subjective Assessment - 03/15/20 1634    Subjective Patient reports she has good days and she has bad days. Some days she has difficulty getting up and going. Other days she feels fine.    Pertinent History kidney disrease    Limitations Sitting    How long can you sit comfortably? Stiffens when she sits at work    How long can you stand comfortably? 10  minutes before she has pain    How long can you walk comfortably? has pain walking around the grocery store    Diagnostic tests MRI: small disc bulge in the back    Patient Stated Goals to have less pain    Currently in Pain? No/denies                             [facility] Adult PT Treatment/Exercise - 03/16/20 0001      Lumbar Exercises: Stretches   Active Hamstring Stretch Limitations 2x30 sec hold     Lower Trunk Rotation Limitations x10       Lumbar Exercises: Standing   Other Standing Lumbar Exercises Shoulder extneison 2x10 red     Other Standing Lumbar Exercises scap retraction 2x10 red. TA breathing reviewd.       Manual Therapy   Soft tissue mobilization trigger point release to gluteal and lumbar spine with roller and with therapist mobilization     Manual Traction able to tolerate grade 3 left and right             Trigger Point Dry Needling - 03/16/20 0001    Consent Given? Yes    Education Handout Provided Yes  Muscles Treated Back/Hip Gluteus medius;Lumbar multifidi    Dry Needling Comments 2 spots in gluteals and 2 in lumbar lowrer multifidi L4 and L5 all on the left using a .30x75 needle     Gluteus Medius Response Twitch response elicited    Lumbar multifidi Response Twitch response elicited                PT Education - 03/16/20 0843    Education Details benefits and risks of TPDN    Person(s) Educated Patient    Methods Explanation;Demonstration;Tactile cues;Verbal cues    Comprehension Verbalized understanding;Returned demonstration;Verbal cues required;Tactile cues required            PT Short Term Goals - 02/14/20 1729      PT SHORT TERM GOAL #1   Title Pateint will increase passive left hip flexion to 90 degres    Time 6    Period Weeks    Status New    Target Date 03/27/20      PT SHORT TERM GOAL #2   Title Patient will report centrialzed LBP <3/10    Time 3    Period Weeks    Status New    Target Date 03/06/20       PT SHORT TERM GOAL #3   Title Patient will increase left hip flexion strength to 4+/5    Time 3    Period Weeks    Status New    Target Date 03/06/20             PT Long Term Goals - 02/14/20 1737      PT LONG TERM GOAL #1   Title Patient will stand to wash dishes for 20 minutes without pain    Time 6    Period Weeks    Status New    Target Date 03/27/20      PT LONG TERM GOAL #2   Title Patient will go up and down 6 steps without increasaed pain in her lowr back    Time 6    Period Weeks    Status New    Target Date 03/27/20      PT LONG TERM GOAL #3   Title Patient will go shopping wihtout self reported pain in her lower back    Time 6    Period Weeks    Status New    Target Date 03/27/20                 Plan - 03/16/20 0843    Clinical Impression Statement Patient had a great tiwtch respose in her lumbar spine, and gluteals. She was encouraged to continue with her stretchign and strengthening at home. SShe was given UE strengthening for home.    Personal Factors and Comorbidities Comorbidity 1;Fitness;Profession    Comorbidities kidney disease which casues her to retain fluid,    Examination-Activity Limitations Sit;Squat;Lift;Stand;Bend;Locomotion Level;Transfers    Examination-Participation Restrictions Driving;Cleaning;Occupation;Meal Prep    Stability/Clinical Decision Making Evolving/Moderate complexity    Clinical Decision Making Moderate    Rehab Potential Good    PT Frequency 1x / week    PT Duration 6 weeks    PT Treatment/Interventions ADLs/Self Care Home Management;Electrical Stimulation;Cryotherapy;Iontophoresis 4mg /ml Dexamethasone;Moist Heat;DME Instruction;Functional mobility training;Therapeutic activities;Therapeutic exercise;Neuromuscular re-education;Patient/family education;Manual techniques;Dry needling;Taping    PT Next Visit Plan trigger point dry needling;  manual soft tissue mobilization; manual therapy to the left hip; begin  vgentle core strengthening; review prone positioning.    PT Home Exercise Plan piriformis stretch,  tennis ball trigger point release, LTR, prone on elbows    Consulted and Agree with Plan of Care Patient           Patient will benefit from skilled therapeutic intervention in order to improve the following deficits and impairments:  Abnormal gait, Difficulty walking, Decreased range of motion, Pain, Increased muscle spasms, Decreased activity tolerance, Decreased strength, Decreased endurance, Decreased mobility  Visit Diagnosis: Acute left-sided low back pain with left-sided sciatica  Radiculopathy, lumbar region  Muscle spasm of back     Problem List Patient Active Problem List   Diagnosis Date Noted  . Laryngitis 10/13/2019  . Healthcare maintenance 07/15/2019  . Stress incontinence 06/17/2019  . Costochondritis, acute 06/17/2019  . Migraine without aura 07/11/2008  . CHRONIC KIDNEY DISEASE STAGE III (MODERATE) 07/11/2008  . ESSENTIAL HYPERTENSION, BENIGN 08/30/2007  . OBESITY, NOS 07/23/2006  . Chronic left-sided low back pain with left-sided sciatica 07/23/2006    Carney Living PT DPT  03/16/2020, 8:49 AM  [place], [place], [address] Phone: [phone]   Fax:  [phone]  Name: Heidi Rivera MRN: [id number] Date of Birth: December 10, 1965

## 2020-03-18 NOTE — Progress Notes (Signed)
Cardiology Office Note:    Date:  03/20/2020   ID:  Heidi Rivera, DOB 05-May-1966, MRN [id number]  PCP:  [name], DO  [facility] Cardiologist:  Freada Bergeron, MD  [facility] Electrophysiologist:  None   Referring MD: Janalee Dane, PA*    History of Present Illness:    Heidi Rivera is a 54 y.o. female with a hx of CKD, HTN, Gout and migraines who was referred by Heidi Paganini, PA for intermittent chest pain.   Patient has been having central chest pain at night when laying down at night. Feels like "something lodged in the throat." Has taken Gas-X without relief. Better with sitting up; worse when laying down flat. Does not radiate to arm/jaw. No symptoms when walking or exerting herself. Not as active due to joint pain and back pain. Able to walk about 0.7miles before needing to stop to rest to catch her breath. No nausea, vomiting, fevers or chills. No known family history of CAD or early cardiac death.   Blood pressure running at home: 180s/90-110s. Has been following with Nephrology. MRI showed bilateral RAS most concerning for atherosclerosis. Was planned for renal angiogram but no appointment yet. Has been taking amlodipine 10mg , Losartan 50mg  qAM and 100mg  qPM, lasix 40mg  BID.   Patient is very concerned today about her blood pressure and the possibility of making her renal function worse that she may need HD in the future. She is very motivated to try and prevent this from happening. She is interested in diet programs as well as food education to help with her hypertension and weight loss.  Family history: mother-DMII; father: HTN, HLD.  TC 198, LDL 119, HDL 60, A1C 6.0.  Past Medical History:  Diagnosis Date  . Acute gastritis   . CHRONIC KIDNEY DISEASE STAGE III (MODERATE) 07/11/2008   Qualifier: Diagnosis of  By: Tye Savoy MD, Tommi Rumps    . Chronic left-sided low back pain with left-sided sciatica 07/23/2006   Qualifier:  Diagnosis of  By: Herma Ard    . Costochondritis, acute 06/17/2019  . Essential hypertension, benign 08/30/2007   Qualifier: Diagnosis of  By: Darylene Price MD, Elta Guadeloupe    . Gout   . Hypertension   . Laryngitis 10/13/2019  . Migraine without aura 07/11/2008   Qualifier: Diagnosis of  By: Carlena Sax  MD, Colletta [place]    . Migraines   . Obesity   . OBESITY, NOS 07/23/2006   Qualifier: Diagnosis of  By: Herma Ard    . Stress incontinence 06/17/2019  . Vertigo     Past Surgical History:  Procedure Laterality Date  . HEMORRHOID SURGERY      Current Medications: Current Meds  Medication Sig  . acetaminophen (TYLENOL) 500 MG tablet Take 1 tablet (500 mg total) by mouth every 6 (six) hours as needed.  [name] albuterol (PROVENTIL HFA;VENTOLIN HFA) 108 (90 BASE) MCG/ACT inhaler Inhale 2 puffs into the lungs every 4 (four) hours as needed for wheezing or shortness of breath.  [name] amLODipine (NORVASC) 10 MG tablet Take 1 tablet (10 mg total) by mouth at bedtime.  . Blood Pressure Monitoring (BLOOD PRESSURE CUFF) MISC 1 Device by Does not apply route daily.  . cyclobenzaprine (FLEXERIL) 10 MG tablet Take 1 tablet (10 mg total) by mouth 3 (three) times daily as needed for muscle spasms.  [name] (AIMOVIG) 70 MG/ML SOAJ Inject 70 mg into the skin every 28 (twenty-eight) days.  . furosemide (LASIX) 40 MG tablet Take 40 mg by  mouth 2 (two) times daily.  [name] losartan (COZAAR) 50 MG tablet Take 50 mg by mouth daily. Per patient taking 50 mg in the morning and 100 mg at night  . oxyCODONE (ROXICODONE) 5 MG immediate release tablet Take 1 tablet (5 mg total) by mouth every 12 (twelve) hours as needed for severe pain.  . SUMAtriptan (IMITREX) 100 MG tablet Take 1 tablet earliest onset of migraine.  May repeat in 2 hours if headache persists or recurs.  Maximum 2 tablets in 24 hours.  . Vitamin D, Ergocalciferol, (DRISDOL) 1.25 MG (50000 UNIT) CAPS capsule Take 50,000 Units by mouth once a week.     Allergies:    Tramadol   Social History   Socioeconomic History  . Marital status: Legally Separated    Spouse name: Not on file  . Number of children: 3  . Years of education: Not on file  . Highest education level: Not on file  Occupational History  . 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 Smoking status: Never Smoker  . Smokeless tobacco: Never Used  Vaping Use  . Vaping Use: Never used  Substance and Sexual Activity  . Alcohol use: Yes    Comment: rare  . Drug use: No  . Sexual activity: Not on file  Other Topics Concern  . Not on file  Social History Narrative   Lives with sister and her husband who smokes    Social Determinants of Health   Financial Resource Strain:   . Difficulty of Paying Living Expenses: Not on file  Food Insecurity:   . Worried About Charity fundraiser in the Last Year: Not on file  . Ran Out of Food in the Last Year: Not on file  Transportation Needs:   . Lack of Transportation (Medical): Not on file  . Lack of Transportation (Non-Medical): Not on file  Physical Activity:   . Days of Exercise per Week: Not on file  . Minutes of Exercise per Session: Not on file  Stress:   . Feeling of Stress : Not on file  Social Connections:   . Frequency of Communication with Friends and Family: Not on file  . Frequency of Social Gatherings with Friends and Family: Not on file  . Attends Religious Services: Not on file  . Active Member of Clubs or Organizations: Not on file  . Attends Archivist Meetings: Not on file  . Marital Status: Not on file     Family History: The patient's family history includes Breast cancer in her mother; Cancer - Other in her paternal grandmother; Colon cancer in her maternal uncle; Diabetes in her maternal aunt and mother; Hypercholesterolemia in her father; Hypertension in her father; Other in her father and mother.  ROS:   Please see the history of present illness.    Review of Systems  Constitutional: Positive  for malaise/fatigue. Negative for chills and fever.  HENT: Negative for sore throat.   Eyes: Negative for blurred vision.  Respiratory: Negative for cough.   Cardiovascular: Positive for chest pain. Negative for palpitations, orthopnea and claudication.  Gastrointestinal: Positive for heartburn. Negative for abdominal pain, nausea and vomiting.  Genitourinary: Negative for dysuria.  Musculoskeletal: Positive for myalgias.  Neurological: Positive for headaches. Negative for weakness.  Psychiatric/Behavioral: Positive for depression.    EKGs/Labs/Other Studies Reviewed:    The following studies were reviewed today: CTA chest 03/2005: Findings: No filling defects are seen in the pulmonary arteries to suggest  pulmonary  emboli. Lungs are clear. No effusions. No mediastinal, hilar, or  axillary adenopathy.    IMPRESSION:    No acute findings. No evidence of pulmonary embolus.   CTA abdomen/pelvis 09/2016: FINDINGS: Lower chest: Lung bases are clear. No effusions. Heart is normal size.  Hepatobiliary: Diffuse fatty infiltration of the liver. No focal hepatic abnormality. Gallbladder unremarkable.  Pancreas: No focal abnormality or ductal dilatation.  Spleen: No focal abnormality.  Normal size.  Adrenals/Urinary Tract: No adrenal abnormality. No focal renal abnormality. No stones or hydronephrosis. Urinary bladder is unremarkable.  Stomach/Bowel: Normal appendix. Stomach, large and small bowel grossly unremarkable.  Vascular/Lymphatic: No evidence of aneurysm or adenopathy.  Reproductive: Uterus and adnexa unremarkable.  No mass.  Other: No free fluid or free air.  Musculoskeletal: No acute bony abnormality.  IMPRESSION: No CT evidence for acute pancreatitis.  Fatty infiltration of the liver.  No acute findings.  MRI 09/10/19: FINDINGS: Vascular: Normal course caliber and contour of the abdominal aorta. No aneurysm, dissection, or significant  calcified plaque.  Celiac artery: Patent celiac artery without significant stenosis.  SMA: Patent SMA without significant stenosis.  IMA: Patent IMA  Renal arteries:  Right renal artery: Short segment stenosis of the right renal artery origin is demonstrated on the MR angiogram images. In addition, there is suggestion of a distal renal artery stenosis, before the division of the renal artery.  No pre hilar branch is on the right. No accessory right renal arteries identified.  Left renal artery: Short segment stenosis of the left renal artery at the origin is demonstrated.  No pre hilar branch is on the left. No accessory left renal artery identified.  Nonvascular:  Lower chest: No acute findings.  Hepatobiliary: Unremarkable liver.  Unremarkable gallbladder.  Pancreas:  Unremarkable  Spleen:  Unremarkable  Adrenals/Urinary Tract:  - Right adrenal gland:  Unremarkable  - Left adrenal gland: Nodule measures less than 1 cm, present on comparison CT though slightly larger. Current MR is nondiagnostic.  - Right kidney: No hydronephrosis. Symmetric signal and perfusion to the left kidney. No significant renal cortical thinning. No focal lesion.  - Left Kidney: No hydronephrosis. Symmetric signal and perfusion to the right. No significant renal cortical thinning. No focal lesion.  Stomach/Bowel: Unremarkable signal of the visualized small bowel and colon.  IMPRESSION: The MR angiogram demonstrates bilateral renal artery origin stenoses, greater on the left. In addition there is a more distal right renal artery stenosis, proximal to the division of the right renal artery. Etiology is presumed to be atherosclerotic, as there are no overt changes of FMD.  Left adrenal nodule measuring 1 cm, with the current MR protocol nondiagnostic.  EKG:  EKG is  ordered today.  The ekg ordered today demonstrates NSR with HR 75 and non-specific ST-T wave  changes.   Recent Labs: 06/15/2019: Hemoglobin 12.5; Platelets 205 01/23/2020: BUN 25; Creatinine, Ser 2.05; Potassium 3.9; Sodium 137  Recent Lipid Panel    Component Value Date/Time   CHOL 198 07/20/2019 1616   TRIG 106 07/20/2019 1616   HDL 60 07/20/2019 1616   CHOLHDL 3.3 07/20/2019 1616   CHOLHDL 3.7 Ratio 09/02/2007 1339   VLDL 18 09/02/2007 1339   LDLCALC 119 (H) 07/20/2019 1616      Physical Exam:    VS:  BP (!) 196/127   Pulse 75   Ht 5' 9.5" (1.765 m)   Wt (!) 311 lb 9.6 oz (141.3 kg)   SpO2 97%   BMI 45.36 kg/m  Wt Readings from Last 3 Encounters:  03/20/20 (!) 311 lb 9.6 oz (141.3 kg)  01/25/20 (!) 307 lb 2 oz (139.3 kg)  01/23/20 (!) 308 lb (139.7 kg)     GEN:  Comfortable HEENT: Normal NECK: No JVD; No carotid bruits CARDIAC: RRR, no murmurs, rubs, gallops RESPIRATORY:  Clear to auscultation without rales, wheezing or rhonchi  ABDOMEN: Soft, non-tender, non-distended MUSCULOSKELETAL:  No edema; No deformity  SKIN: Warm and dry NEUROLOGIC:  Alert and oriented x 3 PSYCHIATRIC:  Normal affect   ASSESSMENT:    1. Chest pain of uncertain etiology   2. Class 3 severe obesity due to excess calories with serious comorbidity and body mass index (BMI) of 40.0 to 44.9 in adult ([place])   3. Shortness of breath   4. Essential hypertension, benign   5. Renal artery stenosis ([place])   6. Dyspnea on exertion   7. Morbid obesity ([place])    PLAN:    In order of problems listed above:  #Atypical Chest Pain: Patient with the feeling like "something is lodged in her throat" when she lays down at night. Improves with sitting elevated. No exertional symptoms although exercise is limited due to joint pain and recent fall. Symptoms consistent with GERD with low suspicion for angina. -Low suspicion for cardiac etiology -Start nexium for GERD -Follow-up with PCP as scheduled  #Resistent HTN: #CKD #Renal Artery Stenosis on MRI: Patient with resistant HTN with BP  running 180-190s at home despite being on amlodipine, losartan and lasix. MRI of the abdomen with bilateral RAS with L>R which may be contributing. Also with suspected OSA not on CPAP. Will plan for f/u with Dr. Gwenlyn Found as well as sleep study. -Start coreg 6.25mg  BID -Continue amlodipine 10mg ; losartan 50mg  QAM and 100mg  QPM and lasix 40mg  BID per Nephrology -Will arrange for follow-up with Dr. Gwenlyn Found regarding RAS -Obtain Sleep Study -Start crestor 10mg  daily given atherosclerosis on MRI as well as multiple risk factors  #DOE: Suspect deconditioning in the setting of low activity. Poorly controlled HTN may also be contributing. However, also with several risk factors including HTN, CKD, and obesity. Will check TTE. -Check TTE  #Snoring #Suspected OSA: -Sleep study as above  #Morbid Obestiy: BMI 45. Counseled extensively about diet and weight loss today. Very motivated to lose weight and is interested in following up at weight loss clinic. -Refer to weight loss clinic -Discussed mediterranean diet and exercise at length as detailed below  Exercise recommendations: Goal of exercising for at least 30 minutes a day, at least 5 times per week.  Please exercise to a moderate exertion.  This means that while exercising it is difficult to speak in full sentences, however you are not so short of breath that you feel you must stop, and not so comfortable that you can carry on a full conversation.  Exertion level should be approximately a 5/10, if 10 is the most exertion you can perform.  Diet recommendations: Recommend a heart healthy diet such as the Mediterranean diet.  This diet consists of plant based foods, healthy fats, lean meats, olive oil.  It suggests limiting the intake of simple carbohydrates such as white breads, pastries, and pastas.  It also limits the amount of red meat, wine, and dairy products such as cheese that one should consume on a daily basis.    Medication Adjustments/Labs  and Tests Ordered: Current medicines are reviewed at length with the patient today.  Concerns regarding medicines are outlined above.  Orders Placed  This Encounter  Procedures  . Amb Ref to Medical Weight Management  . Ambulatory referral to Cardiology  . EKG 12-Lead  . ECHOCARDIOGRAM COMPLETE  . Home sleep test   Meds ordered this encounter  Medications  . rosuvastatin (CRESTOR) 10 MG tablet    Sig: Take 1 tablet (10 mg total) by mouth daily.    Dispense:  90 tablet    Refill:  3  . carvedilol (COREG) 6.25 MG tablet    Sig: Take 1 tablet (6.25 mg total) by mouth 2 (two) times daily.    Dispense:  180 tablet    Refill:  3    Patient Instructions  Medication Instructions:  Your physician has recommended you make the following change in your medication:  1) START taking Coreg (carvedilol) 6.25 mg twice per day 2) START taking Crestor (rosuvastatin) 10 mg daily   *If you need a refill on your cardiac medications before your next appointment, please call your pharmacy*  Testing/Procedures: Your physician has recommended that you have a sleep study. This test records several body functions during sleep, including: brain activity, eye movement, oxygen and carbon dioxide blood levels, heart rate and rhythm, breathing rate and rhythm, the flow of air through your mouth and nose, snoring, body muscle movements, and chest and belly movement.  Your physician has requested that you have an echocardiogram. Echocardiography is a painless test that uses sound waves to create images of your heart. It provides your doctor with information about the size and shape of your heart and how well your heart's chambers and valves are working. This procedure takes approximately one hour. There are no restrictions for this procedure.  Follow-Up: At [facility], you and your health needs are our priority.  As part of our continuing mission to provide you with exceptional heart care, we have created  designated Provider Care Teams.  These Care Teams include your primary Cardiologist (physician) and Advanced Practice Providers (APPs -  Physician Assistants and Nurse Practitioners) who all work together to provide you with the care you need, when you need it.  Your next appointment:   3 month(s)  The format for your next appointment:   In Person  Provider:   You may see Freada Bergeron, MD or one of the following Advanced Practice Providers on your designated Care Team:    Richardson Dopp, PA-C  Vin Sipsey, [place]  Other Instructions You have been referred to the Healthy Weight and Wellness program.     Mediterranean Diet A Mediterranean diet refers to food and lifestyle choices that are based on the traditions of countries located on the [employer]. This way of eating has been shown to help prevent certain conditions and improve outcomes for people who have chronic diseases, like kidney disease and heart disease. What are tips for following this plan? Lifestyle  Cook and eat meals together with your family, when possible.  Drink enough fluid to keep your urine clear or pale yellow.  Be physically active every day. This includes: ? Aerobic exercise like running or swimming. ? Leisure activities like gardening, walking, or housework.  Get 7-8 hours of sleep each night.  If recommended by your health care provider, drink red wine in moderation. This means 1 glass a day for nonpregnant women and 2 glasses a day for men. A glass of wine equals 5 oz (150 mL). Reading food labels   Check the serving size of packaged foods. For foods such as rice and pasta, the  serving size refers to the amount of cooked product, not dry.  Check the total fat in packaged foods. Avoid foods that have saturated fat or trans fats.  Check the ingredients list for added sugars, such as corn syrup. Shopping  At the grocery store, buy most of your food from the areas near the walls of the  store. This includes: ? Fresh fruits and vegetables (produce). ? Grains, beans, nuts, and seeds. Some of these may be available in unpackaged forms or large amounts (in bulk). ? Fresh seafood. ? Poultry and eggs. ? Low-fat dairy products.  Buy whole ingredients instead of prepackaged foods.  Buy fresh fruits and vegetables in-season from local farmers markets.  Buy frozen fruits and vegetables in resealable bags.  If you do not have access to quality fresh seafood, buy precooked frozen shrimp or canned fish, such as tuna, salmon, or sardines.  Buy small amounts of raw or cooked vegetables, salads, or olives from the deli or salad bar at your store.  Stock your pantry so you always have certain foods on hand, such as olive oil, canned tuna, canned tomatoes, rice, pasta, and beans. Cooking  Cook foods with extra-virgin olive oil instead of using butter or other vegetable oils.  Have meat as a side dish, and have vegetables or grains as your main dish. This means having meat in small portions or adding small amounts of meat to foods like pasta or stew.  Use beans or vegetables instead of meat in common dishes like chili or lasagna.  Experiment with different cooking methods. Try roasting or broiling vegetables instead of steaming or sauteing them.  Add frozen vegetables to soups, stews, pasta, or rice.  Add nuts or seeds for added healthy fat at each meal. You can add these to yogurt, salads, or vegetable dishes.  Marinate fish or vegetables using olive oil, lemon juice, garlic, and fresh herbs. Meal planning   Plan to eat 1 vegetarian meal one day each week. Try to work up to 2 vegetarian meals, if possible.  Eat seafood 2 or more times a week.  Have healthy snacks readily available, such as: ? Vegetable sticks with hummus. ? [place] yogurt. ? Fruit and nut trail mix.  Eat balanced meals throughout the week. This includes: ? Fruit: 2-3 servings a day ? Vegetables: 4-5  servings a day ? Low-fat dairy: 2 servings a day ? Fish, poultry, or lean meat: 1 serving a day ? Beans and legumes: 2 or more servings a week ? Nuts and seeds: 1-2 servings a day ? Whole grains: 6-8 servings a day ? Extra-virgin olive oil: 3-4 servings a day  Limit red meat and sweets to only a few servings a month What are my food choices?  Mediterranean diet ? Recommended  Grains: Whole-grain pasta. Brown rice. Bulgar wheat. Polenta. Couscous. Whole-wheat bread. [place].  Vegetables: Artichokes. Beets. Broccoli. Cabbage. Carrots. Eggplant. Green beans. Chard. Kale. Spinach. Onions. Leeks. Peas. Squash. Tomatoes. Peppers. Radishes.  Fruits: Apples. Apricots. Avocado. Berries. Bananas. Cherries. Dates. Figs. Grapes. Lemons. Melon. Oranges. Peaches. Plums. Pomegranate.  Meats and other protein foods: Beans. Almonds. Sunflower seeds. Pine nuts. Peanuts. [place]. Salmon. Scallops. Shrimp. [place]. Tilapia. Clams. Oysters. Eggs.  Dairy: Low-fat milk. Cheese. Greek yogurt.  Beverages: Water. Red wine. Herbal tea.  Fats and oils: Extra virgin olive oil. Avocado oil. Grape seed oil.  Sweets and desserts: [place] yogurt with honey. Baked apples. Poached pears. Trail mix.  Seasoning and other foods: Basil. Cilantro. Coriander. Cumin. Mint.  Parsley. Sage. Rosemary. Tarragon. Garlic. Oregano. Thyme. Pepper. Balsalmic vinegar. Tahini. Hummus. Tomato sauce. Olives. Mushrooms. ? Limit these  Grains: Prepackaged pasta or rice dishes. Prepackaged cereal with added sugar.  Vegetables: Deep fried potatoes (french fries).  Fruits: Fruit canned in syrup.  Meats and other protein foods: Beef. Pork. Lamb. Poultry with skin. Hot dogs. Berniece Salines.  Dairy: Ice cream. Sour cream. Whole milk.  Beverages: Juice. Sugar-sweetened soft drinks. Beer. Liquor and spirits.  Fats and oils: Butter. Canola oil. Vegetable oil. Beef fat (tallow). Lard.  Sweets and desserts: Cookies. Cakes. Pies.  Candy.  Seasoning and other foods: Mayonnaise. Premade sauces and marinades. The items listed may not be a complete list. Talk with your dietitian about what dietary choices are right for you. Summary  The Mediterranean diet includes both food and lifestyle choices.  Eat a variety of fresh fruits and vegetables, beans, nuts, seeds, and whole grains.  Limit the amount of red meat and sweets that you eat.  Talk with your health care provider about whether it is safe for you to drink red wine in moderation. This means 1 glass a day for nonpregnant women and 2 glasses a day for men. A glass of wine equals 5 oz (150 mL). This information is not intended to replace advice given to you by your health care provider. Make sure you discuss any questions you have with your health care provider. Document Revised: 01/10/2016 Document Reviewed: 01/03/2016 Elsevier Patient Education  2020 [place], Freada Bergeron, MD  03/20/2020 10:59 AM    [place]

## 2020-03-20 ENCOUNTER — Other Ambulatory Visit: Payer: Self-pay

## 2020-03-20 ENCOUNTER — Encounter: Payer: Self-pay | Admitting: Cardiology

## 2020-03-20 ENCOUNTER — Ambulatory Visit: Payer: Managed Care, Other (non HMO) | Admitting: Cardiology

## 2020-03-20 VITALS — BP 196/127 | HR 75 | Ht 69.5 in | Wt 311.6 lb

## 2020-03-20 DIAGNOSIS — I10 Essential (primary) hypertension: Secondary | ICD-10-CM

## 2020-03-20 DIAGNOSIS — R0602 Shortness of breath: Secondary | ICD-10-CM | POA: Diagnosis not present

## 2020-03-20 DIAGNOSIS — Z6841 Body Mass Index (BMI) 40.0 and over, adult: Secondary | ICD-10-CM

## 2020-03-20 DIAGNOSIS — R079 Chest pain, unspecified: Secondary | ICD-10-CM | POA: Diagnosis not present

## 2020-03-20 DIAGNOSIS — R0609 Other forms of dyspnea: Secondary | ICD-10-CM

## 2020-03-20 DIAGNOSIS — R0600 Dyspnea, unspecified: Secondary | ICD-10-CM

## 2020-03-20 DIAGNOSIS — I701 Atherosclerosis of renal artery: Secondary | ICD-10-CM

## 2020-03-20 MED ORDER — CARVEDILOL 6.25 MG PO TABS: 6.2500 mg | ORAL_TABLET | Freq: Two times a day (BID) | ORAL | 3 refills | Status: DC

## 2020-03-20 MED ORDER — ROSUVASTATIN CALCIUM 10 MG PO TABS: 10.0000 mg | ORAL_TABLET | Freq: Every day | ORAL | 3 refills | Status: DC

## 2020-03-20 NOTE — Patient Instructions (Addendum)
Medication Instructions:  Your physician has recommended you make the following change in your medication:  1) START taking Coreg (carvedilol) 6.25 mg twice per day 2) START taking Crestor (rosuvastatin) 10 mg daily   *If you need a refill on your cardiac medications before your next appointment, please call your pharmacy*  Testing/Procedures: Your physician has recommended that you have a sleep study. This test records several body functions during sleep, including: brain activity, eye movement, oxygen and carbon dioxide blood levels, heart rate and rhythm, breathing rate and rhythm, the flow of air through your mouth and nose, snoring, body muscle movements, and chest and belly movement.  Your physician has requested that you have an echocardiogram. Echocardiography is a painless test that uses sound waves to create images of your heart. It provides your doctor with information about the size and shape of your heart and how well your heart's chambers and valves are working. This procedure takes approximately one hour. There are no restrictions for this procedure.  Follow-Up: At [facility], you and your health needs are our priority.  As part of our continuing mission to provide you with exceptional heart care, we have created designated Provider Care Teams.  These Care Teams include your primary Cardiologist (physician) and Advanced Practice Providers (APPs -  Physician Assistants and Nurse Practitioners) who all work together to provide you with the care you need, when you need it.  Your next appointment:   3 month(s)  The format for your next appointment:   In Person  Provider:   You may see Freada Bergeron, MD or one of the following Advanced Practice Providers on your designated Care Team:    Richardson Dopp, PA-C  [place]in [place], [place]  Other Instructions You have been referred to the Healthy Weight and Wellness program.     Mediterranean Diet A Mediterranean diet refers to  food and lifestyle choices that are based on the traditions of countries located on the [employer]. This way of eating has been shown to help prevent certain conditions and improve outcomes for people who have chronic diseases, like kidney disease and heart disease. What are tips for following this plan? Lifestyle  Cook and eat meals together with your family, when possible.  Drink enough fluid to keep your urine clear or pale yellow.  Be physically active every day. This includes: ? Aerobic exercise like running or swimming. ? Leisure activities like gardening, walking, or housework.  Get 7-8 hours of sleep each night.  If recommended by your health care provider, drink red wine in moderation. This means 1 glass a day for nonpregnant women and 2 glasses a day for men. A glass of wine equals 5 oz (150 mL). Reading food labels   Check the serving size of packaged foods. For foods such as rice and pasta, the serving size refers to the amount of cooked product, not dry.  Check the total fat in packaged foods. Avoid foods that have saturated fat or trans fats.  Check the ingredients list for added sugars, such as corn syrup. Shopping  At the grocery store, buy most of your food from the areas near the walls of the store. This includes: ? Fresh fruits and vegetables (produce). ? Grains, beans, nuts, and seeds. Some of these may be available in unpackaged forms or large amounts (in bulk). ? Fresh seafood. ? Poultry and eggs. ? Low-fat dairy products.  Buy whole ingredients instead of prepackaged foods.  Buy fresh fruits and vegetables in-season  from local farmers markets.  Buy frozen fruits and vegetables in resealable bags.  If you do not have access to quality fresh seafood, buy precooked frozen shrimp or canned fish, such as tuna, salmon, or sardines.  Buy small amounts of raw or cooked vegetables, salads, or olives from the deli or salad bar at your store.  Stock your  pantry so you always have certain foods on hand, such as olive oil, canned tuna, canned tomatoes, rice, pasta, and beans. Cooking  Cook foods with extra-virgin olive oil instead of using butter or other vegetable oils.  Have meat as a side dish, and have vegetables or grains as your main dish. This means having meat in small portions or adding small amounts of meat to foods like pasta or stew.  Use beans or vegetables instead of meat in common dishes like chili or lasagna.  Experiment with different cooking methods. Try roasting or broiling vegetables instead of steaming or sauteing them.  Add frozen vegetables to soups, stews, pasta, or rice.  Add nuts or seeds for added healthy fat at each meal. You can add these to yogurt, salads, or vegetable dishes.  Marinate fish or vegetables using olive oil, lemon juice, garlic, and fresh herbs. Meal planning   Plan to eat 1 vegetarian meal one day each week. Try to work up to 2 vegetarian meals, if possible.  Eat seafood 2 or more times a week.  Have healthy snacks readily available, such as: ? Vegetable sticks with hummus. ? [place] yogurt. ? Fruit and nut trail mix.  Eat balanced meals throughout the week. This includes: ? Fruit: 2-3 servings a day ? Vegetables: 4-5 servings a day ? Low-fat dairy: 2 servings a day ? Fish, poultry, or lean meat: 1 serving a day ? Beans and legumes: 2 or more servings a week ? Nuts and seeds: 1-2 servings a day ? Whole grains: 6-8 servings a day ? Extra-virgin olive oil: 3-4 servings a day  Limit red meat and sweets to only a few servings a month What are my food choices?  Mediterranean diet ? Recommended  Grains: Whole-grain pasta. Brown rice. Bulgar wheat. Polenta. Couscous. Whole-wheat bread. [place].  Vegetables: Artichokes. Beets. Broccoli. Cabbage. Carrots. Eggplant. Green beans. Chard. Kale. Spinach. Onions. Leeks. Peas. Squash. Tomatoes. Peppers. Radishes.  Fruits: Apples.  Apricots. Avocado. Berries. Bananas. Cherries. Dates. Figs. Grapes. Lemons. Melon. Oranges. Peaches. Plums. Pomegranate.  Meats and other protein foods: Beans. Almonds. Sunflower seeds. Pine nuts. Peanuts. [place]. Salmon. Scallops. Shrimp. [place]. Tilapia. Clams. Oysters. Eggs.  Dairy: Low-fat milk. Cheese. Greek yogurt.  Beverages: Water. Red wine. Herbal tea.  Fats and oils: Extra virgin olive oil. Avocado oil. Grape seed oil.  Sweets and desserts: [place] yogurt with honey. Baked apples. Poached pears. Trail mix.  Seasoning and other foods: Basil. Cilantro. Coriander. Cumin. Mint. Parsley. Sage. Rosemary. Tarragon. Garlic. Oregano. Thyme. Pepper. Balsalmic vinegar. Tahini. Hummus. Tomato sauce. Olives. Mushrooms. ? Limit these  Grains: Prepackaged pasta or rice dishes. Prepackaged cereal with added sugar.  Vegetables: Deep fried potatoes (french fries).  Fruits: Fruit canned in syrup.  Meats and other protein foods: Beef. Pork. Lamb. Poultry with skin. Hot dogs. Berniece Salines.  Dairy: Ice cream. Sour cream. Whole milk.  Beverages: Juice. Sugar-sweetened soft drinks. Beer. Liquor and spirits.  Fats and oils: Butter. Canola oil. Vegetable oil. Beef fat (tallow). Lard.  Sweets and desserts: Cookies. Cakes. Pies. Candy.  Seasoning and other foods: Mayonnaise. Premade sauces and marinades. The items listed may not be  a complete list. Talk with your dietitian about what dietary choices are right for you. Summary  The Mediterranean diet includes both food and lifestyle choices.  Eat a variety of fresh fruits and vegetables, beans, nuts, seeds, and whole grains.  Limit the amount of red meat and sweets that you eat.  Talk with your health care provider about whether it is safe for you to drink red wine in moderation. This means 1 glass a day for nonpregnant women and 2 glasses a day for men. A glass of wine equals 5 oz (150 mL). This information is not intended to replace advice given to you by  your health care provider. Make sure you discuss any questions you have with your health care provider. Document Revised: 01/10/2016 Document Reviewed: 01/03/2016 Elsevier Patient Education  [place].

## 2020-03-21 ENCOUNTER — Telehealth: Payer: Self-pay | Admitting: *Deleted

## 2020-03-21 NOTE — Telephone Encounter (Signed)
-----   Message from Antonieta Iba, RN sent at 03/20/2020 10:29 AM EDT ----- Home sleep study has been ordered.  Thanks!

## 2020-03-22 ENCOUNTER — Ambulatory Visit: Payer: Managed Care, Other (non HMO) | Admitting: Physical Therapy

## 2020-03-22 ENCOUNTER — Other Ambulatory Visit: Payer: Self-pay

## 2020-03-22 ENCOUNTER — Encounter: Payer: Self-pay | Admitting: Physical Therapy

## 2020-03-22 DIAGNOSIS — M5442 Lumbago with sciatica, left side: Secondary | ICD-10-CM | POA: Diagnosis not present

## 2020-03-22 DIAGNOSIS — M5416 Radiculopathy, lumbar region: Secondary | ICD-10-CM

## 2020-03-22 DIAGNOSIS — M62830 Muscle spasm of back: Secondary | ICD-10-CM

## 2020-03-22 NOTE — Patient Instructions (Signed)
Access Code: [email or web address] URL: [email or web address] Date: 03/22/2020 Prepared by: Starr Lake  Exercises Seated Hamstring Stretch - 1 x daily - 7 x weekly - 2 reps - 2 sets - 30 hold Lower Trunk Rotation - 1 x daily - 7 x weekly - 2 sets - 10 reps SLR - 1 x daily - 7 x weekly - 10 reps - 2 sets - 1 hold

## 2020-03-22 NOTE — Therapy (Addendum)
Heidi Rivera, Heidi Rivera, Heidi Rivera Phone: [phone]   Fax:  [phone]  Physical Therapy Treatment / Discharge  Patient Details  Name: Heidi Rivera MRN: [id number] Date of Birth: 1965-07-22 Referring Provider (PT): Dr Pleas Koch    Encounter Date: 03/22/2020   PT End of Session - 03/22/20 1627    Visit Number 4    Number of Visits 6    Date for PT Re-Evaluation 03/27/20    Authorization Type [id number] dollar co-pay    PT Start Time 1628    PT Stop Time 1715    PT Time Calculation (min) 47 min    Activity Tolerance Patient tolerated treatment well    Behavior During Therapy [facility] for tasks assessed/performed           Past Medical History:  Diagnosis Date  . Acute gastritis   . CHRONIC KIDNEY DISEASE STAGE III (MODERATE) 07/11/2008   Qualifier: Diagnosis of  By: Tye Savoy MD, Tommi Rumps    . Chronic left-sided low back pain with left-sided sciatica 07/23/2006   Qualifier: Diagnosis of  By: Herma Ard    . Costochondritis, acute 06/17/2019  . Essential hypertension, benign 08/30/2007   Qualifier: Diagnosis of  By: Darylene Price MD, Elta Guadeloupe    . Gout   . Hypertension   . Laryngitis 10/13/2019  . Migraine without aura 07/11/2008   Qualifier: Diagnosis of  By: Carlena Sax  MD, Colletta [place]    . Migraines   . Obesity   . OBESITY, NOS 07/23/2006   Qualifier: Diagnosis of  By: Herma Ard    . Stress incontinence 06/17/2019  . Vertigo     Past Surgical History:  Procedure Laterality Date  . HEMORRHOID SURGERY      There were no vitals filed for this visit.   Subjective Assessment - 03/22/20 1630    Subjective "Things have been going good. I am having pain both ankles which occured from a fall down about 6 steps.    Currently in Pain? Yes    Pain Score 2     Pain Location Back    Pain Orientation Left    Pain Descriptors / Indicators Aching    Pain Type Chronic pain    Pain Onset More than a month  ago    Pain Frequency Intermittent    Aggravating Factors  bending forward, waking up in the AM, seated posture.    Pain Relieving Factors doing the stretches, using the band, prone on the stomach,    Pain Score 2    Pain Location Ankle    Pain Orientation Left    Pain Descriptors / Indicators Aching    Pain Onset More than a month ago    Pain Frequency Intermittent    Aggravating Factors  standing/ walking    Pain Relieving Factors rest              [facility] PT Assessment - 03/22/20 0001      Assessment   Medical Diagnosis Low back pain with left radiculopathy     Referring Provider (PT) Dr Pleas Koch     Hand Dominance Right    Next MD Visit March 2022                          [facility] Adult PT Treatment/Exercise - 03/22/20 0001      Ambulation/Gait   Stairs Yes    Stairs Assistance 7: Independent;5: Supervision  Stairs Assistance Details (indicate cue type and reason) Demonstration, SBA, verbal cuing    Stair Management Technique Two rails;Alternating pattern    Number of Stairs 4    Height of Stairs 6      Lumbar Exercises: Stretches   Active Hamstring Stretch Limitations 2x30 sec hold     Lower Trunk Rotation Limitations x10   1 x 10 bil     Lumbar Exercises: Aerobic   Nustep L5 x 6 min UE/LE      Lumbar Exercises: Supine   Bent Knee Raise 20 reps   PPT, 10 reps on each leg   Straight Leg Raise 10 reps   1 x on RLE, 2 x on LLE     Knee/Hip Exercises: Standing   Stairs navigating up/ down 6 inch step with verbal cues for proper form going forward vs to the side x 4                  PT Education - 03/22/20 1707    Education Details Anatomy pertaining to SIJ and low back, stair ascent/descent, benefit of moving thoughtfully    Person(s) Educated Patient    Methods Explanation;Demonstration    Comprehension Verbalized understanding;Returned demonstration            PT Short Term Goals - 02/14/20 1729      PT SHORT TERM GOAL #1    Title Pateint will increase passive left hip flexion to 90 degres    Time 6    Period Weeks    Status New    Target Date 03/27/20      PT SHORT TERM GOAL #2   Title Patient will report centrialzed LBP <3/10    Time 3    Period Weeks    Status New    Target Date 03/06/20      PT SHORT TERM GOAL #3   Title Patient will increase left hip flexion strength to 4+/5    Time 3    Period Weeks    Status New    Target Date 03/06/20             PT Long Term Goals - 02/14/20 1737      PT LONG TERM GOAL #1   Title Patient will stand to wash dishes for 20 minutes without pain    Time 6    Period Weeks    Status New    Target Date 03/27/20      PT LONG TERM GOAL #2   Title Patient will go up and down 6 steps without increasaed pain in her lowr back    Time 6    Period Weeks    Status New    Target Date 03/27/20      PT LONG TERM GOAL #3   Title Patient will go shopping wihtout self reported pain in her lower back    Time 6    Period Weeks    Status New    Target Date 03/27/20                 Plan - 03/22/20 1700    Clinical Impression Statement Pt came in with some pain located approximately in the left PSIS and the patient was evaluated for SIJ dysfunction via thigh thrust test and hip flexion MET, both of which were positive. Patient demonstrated tight hamstrings which were stretched in the supine position and then combined with LTRs, SLRs, and supine marches. Pt tolerated these exercises well with  minimal cuing. Practiced stair training which she required cues for proper form going forward which she demonstrated improvement in mobility and decreased apprehension    Personal Factors and Comorbidities Comorbidity 1;Fitness;Profession    Comorbidities kidney disease which casues her to retain fluid,    Examination-Activity Limitations Sit;Squat;Lift;Stand;Bend;Locomotion Level;Transfers    Examination-Participation Restrictions Driving;Cleaning;Occupation;Meal Prep     Stability/Clinical Decision Making Evolving/Moderate complexity    Clinical Decision Making Moderate    Rehab Potential Good    PT Frequency 1x / week    PT Duration 6 weeks    PT Treatment/Interventions ADLs/Self Care Home Management;Electrical Stimulation;Cryotherapy;Iontophoresis 16m/ml Dexamethasone;Moist Heat;DME Instruction;Functional mobility training;Therapeutic activities;Therapeutic exercise;Neuromuscular re-education;Patient/family education;Manual techniques;Dry needling;Taping    PT Next Visit Plan trigger point dry needling;  manual soft tissue mobilization;  begin vgentle core strengthening; review prone positioning.    PT Home Exercise Plan piriformis stretch, tennis ball trigger point release, LTR, prone on elbows    Consulted and Agree with Plan of Care Patient           Patient will benefit from skilled therapeutic intervention in order to improve the following deficits and impairments:  Abnormal gait, Difficulty walking, Decreased range of motion, Pain, Increased muscle spasms, Decreased activity tolerance, Decreased strength, Decreased endurance, Decreased mobility  Visit Diagnosis: Acute left-sided low back pain with left-sided sciatica  Radiculopathy, lumbar region  Muscle spasm of back     Problem List Patient Active Problem List   Diagnosis Date Noted  . Laryngitis 10/13/2019  . Healthcare maintenance 07/15/2019  . Stress incontinence 06/17/2019  . Costochondritis, acute 06/17/2019  . Migraine without aura 07/11/2008  . CHRONIC KIDNEY DISEASE STAGE III (MODERATE) 07/11/2008  . ESSENTIAL HYPERTENSION, BENIGN 08/30/2007  . OBESITY, NOS 07/23/2006  . Chronic left-sided low back pain with left-sided sciatica 07/23/2006   KStarr LakePT, DPT, LAT, ATC  03/22/20  5:33 PM      [facility]CA[facility]GYarrow [place] [address]Phone: [phone]  Fax:  [phone] Name: Heidi BachmannMRN: [id number] of Birth: [birth year]     PHYSICAL THERAPY DISCHARGE SUMMARY  Visits from Start of Care: 4  Current functional level related to goals / functional outcomes: See goals   Remaining deficits: Current status unknown   Education / Equipment: HEP, theraband, posture,   Plan: Patient agrees to discharge.  Patient goals were partially met. Patient is being discharged due to not returning since the last visit.  ?????        Zykiria Bruening PT, DPT, LAT, ATC  05/07/20  2:45 PM

## 2020-03-29 ENCOUNTER — Ambulatory Visit
Payer: Managed Care, Other (non HMO) | Attending: Neurology | Admitting: Rehabilitative and Restorative Service Providers"

## 2020-04-03 ENCOUNTER — Other Ambulatory Visit: Payer: Self-pay

## 2020-04-03 ENCOUNTER — Ambulatory Visit (HOSPITAL_COMMUNITY): Payer: Managed Care, Other (non HMO) | Attending: Cardiology

## 2020-04-03 DIAGNOSIS — R0602 Shortness of breath: Secondary | ICD-10-CM | POA: Diagnosis present

## 2020-04-03 LAB — ECHOCARDIOGRAM COMPLETE
Area-P 1/2: 3.08 cm2
S' Lateral: 2.5 cm

## 2020-04-04 ENCOUNTER — Ambulatory Visit (INDEPENDENT_AMBULATORY_CARE_PROVIDER_SITE_OTHER): Payer: Self-pay | Admitting: Bariatrics

## 2020-04-05 ENCOUNTER — Ambulatory Visit: Payer: Managed Care, Other (non HMO) | Admitting: Physical Therapy

## 2020-04-13 ENCOUNTER — Encounter: Payer: Self-pay | Admitting: Cardiovascular Disease

## 2020-04-13 ENCOUNTER — Ambulatory Visit (INDEPENDENT_AMBULATORY_CARE_PROVIDER_SITE_OTHER): Payer: Managed Care, Other (non HMO) | Admitting: Cardiovascular Disease

## 2020-04-13 ENCOUNTER — Other Ambulatory Visit: Payer: Self-pay

## 2020-04-13 VITALS — BP 140/90 | HR 66 | Ht 69.5 in | Wt 314.0 lb

## 2020-04-13 DIAGNOSIS — I701 Atherosclerosis of renal artery: Secondary | ICD-10-CM | POA: Diagnosis not present

## 2020-04-13 DIAGNOSIS — Z01810 Encounter for preprocedural cardiovascular examination: Secondary | ICD-10-CM

## 2020-04-13 DIAGNOSIS — Z01818 Encounter for other preprocedural examination: Secondary | ICD-10-CM

## 2020-04-13 LAB — CBC
Hematocrit: 41 % (ref 34.0–46.6)
Hemoglobin: 13.6 g/dL (ref 11.1–15.9)
MCH: 28 pg (ref 26.6–33.0)
MCHC: 33.2 g/dL (ref 31.5–35.7)
MCV: 85 fL (ref 79–97)
Platelets: 248 10*3/uL (ref 150–450)
RBC: 4.85 x10E6/uL (ref 3.77–5.28)
RDW: 13.9 % (ref 11.7–15.4)
WBC: 6.9 10*3/uL (ref 3.4–10.8)

## 2020-04-13 LAB — BASIC METABOLIC PANEL
BUN/Creatinine Ratio: 9 (ref 9–23)
BUN: 18 mg/dL (ref 6–24)
CO2: 24 mmol/L (ref 20–29)
Calcium: 9.3 mg/dL (ref 8.7–10.2)
Chloride: 103 mmol/L (ref 96–106)
Creatinine, Ser: 2.11 mg/dL — ABNORMAL HIGH (ref 0.57–1.00)
GFR calc Af Amer: 30 mL/min/{1.73_m2} — ABNORMAL LOW (ref >59)
GFR calc non Af Amer: 26 mL/min/{1.73_m2} — ABNORMAL LOW (ref >59)
Glucose: 107 mg/dL — ABNORMAL HIGH (ref 65–99)
Potassium: 4.7 mmol/L (ref 3.5–5.2)
Sodium: 140 mmol/L (ref 134–144)

## 2020-04-13 NOTE — Patient Instructions (Signed)
Medication Instructions:  No changes *If you need a refill on your cardiac medications before your next appointment, please call your pharmacy*   Testing/Procedures: Your physician has requested that you have a peripheral vascular angiogram. This exam is performed at the hospital. During this exam IV contrast is used to look at arterial blood flow. Please review the information sheet given for details.  Follow-Up: At [facility], you and your health needs are our priority.  As part of our continuing mission to provide you with exceptional heart care, we have created designated Provider Care Teams.  These Care Teams include your primary Cardiologist (physician) and Advanced Practice Providers (APPs -  Physician Assistants and Nurse Practitioners) who all work together to provide you with the care you need, when you need it.  We recommend signing up for the patient portal called "MyChart".  Sign up information is provided on this After Visit Summary.  MyChart is used to connect with patients for Virtual Visits (Telemedicine).  Patients are able to view lab/test results, encounter notes, upcoming appointments, etc.  Non-urgent messages can be sent to your provider as well.   To learn more about what you can do with MyChart, go to [email or web address].    Your next appointment:   Follow up in one month with Dr. Gwenlyn Found after the procedure on 04/26/20   Other Instructions    [place] [address] [place] [address] Dept: [phone] Loc: [place]  04/13/2020  You are scheduled for a Peripheral Angiogram on Thursday, December 2 with Dr. Quay Burow.  1. Please arrive at the [facility] (Main Entrance A) at [facility]: [address] [place], [place] [address] at 7:30 AM (This time is two hours before your procedure to ensure your preparation). Free valet  parking service is available.   Special note: Every effort is made to have your procedure done on time. Please understand that emergencies sometimes delay scheduled procedures.  2. Diet: Do not eat solid foods after midnight.  The patient may have clear liquids until 5am upon the day of the procedure.  3. Labs:  You will need to have the coronavirus test completed prior to your procedure. An appointment has been made at 2:40 pm on 04/23/20. This is a Drive Up Visit at [address], [place], [place] [address]. Please tell them that you are there for procedure testing. Stay in your car and someone will be with you shortly. Please make sure to have all other labs completed before this test because you will need to stay quarantined until your procedure.   4. Medication instructions in preparation for your procedure: Hold the Furosemide the morning of the procedure Hold the Losartan the morning of the procedure]  On the morning of your procedure, take your Aspirin and any morning medicines NOT listed above.  You may use sips of water.  5. Plan for one night stay--bring personal belongings. 6. Bring a current list of your medications and current insurance cards. 7. You MUST have a responsible person to drive you home. 8. Someone MUST be with you the first 24 hours after you arrive home or your discharge will be delayed. 9. Please wear clothes that are easy to get on and off and wear slip-on shoes.  Thank you for allowing [place] to care for you!   -- [place] [facility]

## 2020-04-13 NOTE — Progress Notes (Signed)
04/13/2020 Heidi Rivera   04-Dec-1965  [id number]  Primary Physician [name], DO Primary Cardiologist: [name] MD [name], [place]  HPI:  Heidi Rivera is a 54 y.o. morbidly overweight single African-American female mother of 3 children, grandmother of 31 grandchildren who is accompanied by [name], one of her daughters.  She is referred by [name] for diagnosis and treatment of presumed renal vascular hypertension.  She has been hypertensive for last 10 years and has not had documented renal insufficiency for at least a year.  She is on 3 antihypertensive medications with blood pressures that run in the 200/100 range at home.  She does get a occasional chest pressure.  She is not diabetic nor does she smoke.  There is no family history for heart disease.  She is never had a heart attack or stroke.  She did have a MRI angiogram performed 09/10/2019 that suggested bilateral renal artery stenosis.  Current Meds  Medication Sig  . acetaminophen (TYLENOL) 500 MG tablet Take 1 tablet (500 mg total) by mouth every 6 (six) hours as needed.  [name] albuterol (PROVENTIL HFA;VENTOLIN HFA) 108 (90 BASE) MCG/ACT inhaler Inhale 2 puffs into the lungs every 4 (four) hours as needed for wheezing or shortness of breath.  [name] amLODipine (NORVASC) 10 MG tablet Take 1 tablet (10 mg total) by mouth at bedtime.  . Blood Pressure Monitoring (BLOOD PRESSURE CUFF) MISC 1 Device by Does not apply route daily.  . carvedilol (COREG) 6.25 MG tablet Take 1 tablet (6.25 mg total) by mouth 2 (two) times daily.  . cyclobenzaprine (FLEXERIL) 10 MG tablet Take 1 tablet (10 mg total) by mouth 3 (three) times daily as needed for muscle spasms.  [name] (AIMOVIG) 70 MG/ML SOAJ Inject 70 mg into the skin every 28 (twenty-eight) days.  . furosemide (LASIX) 40 MG tablet Take 40 mg by mouth 2 (two) times daily.  [name] losartan (COZAAR) 50 MG tablet Take 50 mg by mouth daily. Per patient  taking 50 mg in the morning and 100 mg at night  . oxyCODONE (ROXICODONE) 5 MG immediate release tablet Take 1 tablet (5 mg total) by mouth every 12 (twelve) hours as needed for severe pain.  . rosuvastatin (CRESTOR) 10 MG tablet Take 1 tablet (10 mg total) by mouth daily.  . SUMAtriptan (IMITREX) 100 MG tablet Take 1 tablet earliest onset of migraine.  May repeat in 2 hours if headache persists or recurs.  Maximum 2 tablets in 24 hours.  . Vitamin D, Ergocalciferol, (DRISDOL) 1.25 MG (50000 UNIT) CAPS capsule Take 50,000 Units by mouth once a week.     Allergies  Allergen Reactions  . Tramadol Other (See Comments)    Intolerance : GI Upset    Social History   Socioeconomic History  . Marital status: Legally Separated    Spouse name: Not on file  . Number of children: 3  . Years of education: Not on file  . Highest education level: Not on file  Occupational History  . 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 Smoking status: Never Smoker  . Smokeless tobacco: Never Used  Vaping Use  . Vaping Use: Never used  Substance and Sexual Activity  . Alcohol use: Yes    Comment: rare  . Drug use: No  . Sexual activity: Not on file  Other Topics Concern  . Not on file  Social History Narrative   Lives with sister and her  husband who smokes    Social Determinants of Radio broadcast assistant Strain:   . Difficulty of Paying Living Expenses: Not on file  Food Insecurity:   . Worried About Charity fundraiser in the Last Year: Not on file  . Ran Out of Food in the Last Year: Not on file  Transportation Needs:   . Lack of Transportation (Medical): Not on file  . Lack of Transportation (Non-Medical): Not on file  Physical Activity:   . Days of Exercise per Week: Not on file  . Minutes of Exercise per Session: Not on file  Stress:   . Feeling of Stress : Not on file  Social Connections:   . Frequency of Communication with Friends and Family: Not on file  . Frequency of  Social Gatherings with Friends and Family: Not on file  . Attends Religious Services: Not on file  . Active Member of Clubs or Organizations: Not on file  . Attends Archivist Meetings: Not on file  . Marital Status: Not on file  Intimate Partner Violence:   . Fear of Current or Ex-Partner: Not on file  . Emotionally Abused: Not on file  . Physically Abused: Not on file  . Sexually Abused: Not on file     Review of Systems: General: negative for chills, fever, night sweats or weight changes.  Cardiovascular: negative for chest pain, dyspnea on exertion, edema, orthopnea, palpitations, paroxysmal nocturnal dyspnea or shortness of breath Dermatological: negative for rash Respiratory: negative for cough or wheezing Urologic: negative for hematuria Abdominal: negative for nausea, vomiting, diarrhea, bright red blood per rectum, melena, or hematemesis Neurologic: negative for visual changes, syncope, or dizziness All other systems reviewed and are otherwise negative except as noted above.    Blood pressure 140/90, pulse 66, height 5' 9.5" (1.765 m), weight (!) 314 lb (142.4 kg).  General appearance: alert and no distress Neck: no adenopathy, no carotid bruit, no JVD, supple, symmetrical, trachea midline and thyroid not enlarged, symmetric, no tenderness/mass/nodules Lungs: clear to auscultation bilaterally Heart: regular rate and rhythm, S1, S2 normal, no murmur, click, rub or gallop Extremities: extremities normal, atraumatic, no cyanosis or edema Pulses: 2+ and symmetric Skin: Skin color, texture, turgor normal. No rashes or lesions Neurologic: Alert and oriented X 3, normal strength and tone. Normal symmetric reflexes. Normal coordination and gait  EKG not performed today  ASSESSMENT AND PLAN:   Renal artery stenosis ([name]) [name] was referred to me by [name] and [facility], her nephrologist, for evaluation and treatment of presumed renal vascular  hypertension.  She has had hypertension for last 10 years and recently was noted to have renal dysfunction in January.  She is on 3 antihypertensive medications.  MR angiogram performed 09/10/2019 suggested bilateral renal artery stenosis.  Her serum creatinine runs in the 2 range.  I think she would benefit from CO2 abdominal aortography, selective renal artery angiography plus or minus stenting for treatment of presumed renal artery stenosis.  I am going to get renal Dopplers as a baseline as well.      [name] MD FACP,FACC,FAHA, [facility] 04/13/2020 9:17 AM

## 2020-04-13 NOTE — Assessment & Plan Note (Signed)
Ms. Parkin was referred to me by [name] and [facility], her nephrologist, for evaluation and treatment of presumed renal vascular hypertension.  She has had hypertension for last 10 years and recently was noted to have renal dysfunction in January.  She is on 3 antihypertensive medications.  MR angiogram performed 09/10/2019 suggested bilateral renal artery stenosis.  Her serum creatinine runs in the 2 range.  I think she would benefit from CO2 abdominal aortography, selective renal artery angiography plus or minus stenting for treatment of presumed renal artery stenosis.  I am going to get renal Dopplers as a baseline as well.

## 2020-04-13 NOTE — H&P (View-Only) (Signed)
04/13/2020 Heidi Rivera   1965-10-27  [id number]  Primary Physician Heidi Hensen, DO Primary Cardiologist: Heidi Harp MD Heidi Rivera, [place]  HPI:  Heidi Rivera is a 54 y.o. morbidly overweight single African-American female mother of 3 children, grandmother of 55 grandchildren who is accompanied by Heidi Rivera, one of her daughters.  She is referred by Dr. Johney Rivera for diagnosis and treatment of presumed renal vascular hypertension.  She has been hypertensive for last 10 years and has not had documented renal insufficiency for at least a year.  She is on 3 antihypertensive medications with blood pressures that run in the 200/100 range at home.  She does get a occasional chest pressure.  She is not diabetic nor does she smoke.  There is no family history for heart disease.  She is never had a heart attack or stroke.  She did have a MRI angiogram performed 09/10/2019 that suggested bilateral renal artery stenosis.  Current Meds  Medication Sig  . acetaminophen (TYLENOL) 500 MG tablet Take 1 tablet (500 mg total) by mouth every 6 (six) hours as needed.  [name] albuterol (PROVENTIL HFA;VENTOLIN HFA) 108 (90 BASE) MCG/ACT inhaler Inhale 2 puffs into the lungs every 4 (four) hours as needed for wheezing or shortness of breath.  [name] amLODipine (NORVASC) 10 MG tablet Take 1 tablet (10 mg total) by mouth at bedtime.  . Blood Pressure Monitoring (BLOOD PRESSURE CUFF) MISC 1 Device by Does not apply route daily.  . carvedilol (COREG) 6.25 MG tablet Take 1 tablet (6.25 mg total) by mouth 2 (two) times daily.  . cyclobenzaprine (FLEXERIL) 10 MG tablet Take 1 tablet (10 mg total) by mouth 3 (three) times daily as needed for muscle spasms.  [name] (AIMOVIG) 70 MG/ML SOAJ Inject 70 mg into the skin every 28 (twenty-eight) days.  . furosemide (LASIX) 40 MG tablet Take 40 mg by mouth 2 (two) times daily.  [name] losartan (COZAAR) 50 MG tablet Take 50 mg by mouth daily. Per patient  taking 50 mg in the morning and 100 mg at night  . oxyCODONE (ROXICODONE) 5 MG immediate release tablet Take 1 tablet (5 mg total) by mouth every 12 (twelve) hours as needed for severe pain.  . rosuvastatin (CRESTOR) 10 MG tablet Take 1 tablet (10 mg total) by mouth daily.  . SUMAtriptan (IMITREX) 100 MG tablet Take 1 tablet earliest onset of migraine.  May repeat in 2 hours if headache persists or recurs.  Maximum 2 tablets in 24 hours.  . Vitamin D, Ergocalciferol, (DRISDOL) 1.25 MG (50000 UNIT) CAPS capsule Take 50,000 Units by mouth once a week.     Allergies  Allergen Reactions  . Tramadol Other (See Comments)    Intolerance : GI Upset    Social History   Socioeconomic History  . Marital status: Legally Separated    Spouse name: Not on file  . Number of children: 3  . Years of education: Not on file  . Highest education level: Not on file  Occupational History  . 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 Smoking status: Never Smoker  . Smokeless tobacco: Never Used  Vaping Use  . Vaping Use: Never used  Substance and Sexual Activity  . Alcohol use: Yes    Comment: rare  . Drug use: No  . Sexual activity: Not on file  Other Topics Concern  . Not on file  Social History Narrative   Lives with sister and her  husband who smokes    Social Determinants of Radio broadcast assistant Strain:   . Difficulty of Paying Living Expenses: Not on file  Food Insecurity:   . Worried About Charity fundraiser in the Last Year: Not on file  . Ran Out of Food in the Last Year: Not on file  Transportation Needs:   . Lack of Transportation (Medical): Not on file  . Lack of Transportation (Non-Medical): Not on file  Physical Activity:   . Days of Exercise per Week: Not on file  . Minutes of Exercise per Session: Not on file  Stress:   . Feeling of Stress : Not on file  Social Connections:   . Frequency of Communication with Friends and Family: Not on file  . Frequency of  Social Gatherings with Friends and Family: Not on file  . Attends Religious Services: Not on file  . Active Member of Clubs or Organizations: Not on file  . Attends Archivist Meetings: Not on file  . Marital Status: Not on file  Intimate Partner Violence:   . Fear of Current or Ex-Partner: Not on file  . Emotionally Abused: Not on file  . Physically Abused: Not on file  . Sexually Abused: Not on file     Review of Systems: General: negative for chills, fever, night sweats or weight changes.  Cardiovascular: negative for chest pain, dyspnea on exertion, edema, orthopnea, palpitations, paroxysmal nocturnal dyspnea or shortness of breath Dermatological: negative for rash Respiratory: negative for cough or wheezing Urologic: negative for hematuria Abdominal: negative for nausea, vomiting, diarrhea, bright red blood per rectum, melena, or hematemesis Neurologic: negative for visual changes, syncope, or dizziness All other systems reviewed and are otherwise negative except as noted above.    Blood pressure 140/90, pulse 66, height 5' 9.5" (1.765 m), weight (!) 314 lb (142.4 kg).  General appearance: alert and no distress Neck: no adenopathy, no carotid bruit, no JVD, supple, symmetrical, trachea midline and thyroid not enlarged, symmetric, no tenderness/mass/nodules Lungs: clear to auscultation bilaterally Heart: regular rate and rhythm, S1, S2 normal, no murmur, click, rub or gallop Extremities: extremities normal, atraumatic, no cyanosis or edema Pulses: 2+ and symmetric Skin: Skin color, texture, turgor normal. No rashes or lesions Neurologic: Alert and oriented X 3, normal strength and tone. Normal symmetric reflexes. Normal coordination and gait  EKG not performed today  ASSESSMENT AND PLAN:   Renal artery stenosis ([place]) Ms. Below was referred to me by Dr. Johney Rivera and [facility], her nephrologist, for evaluation and treatment of presumed renal vascular  hypertension.  She has had hypertension for last 10 years and recently was noted to have renal dysfunction in January.  She is on 3 antihypertensive medications.  MR angiogram performed 09/10/2019 suggested bilateral renal artery stenosis.  Her serum creatinine runs in the 2 range.  I think she would benefit from CO2 abdominal aortography, selective renal artery angiography plus or minus stenting for treatment of presumed renal artery stenosis.  I am going to get renal Dopplers as a baseline as well.      Heidi Harp MD FACP,FACC,FAHA, [facility] 04/13/2020 9:17 AM

## 2020-04-16 ENCOUNTER — Other Ambulatory Visit: Payer: Self-pay

## 2020-04-16 ENCOUNTER — Ambulatory Visit (HOSPITAL_COMMUNITY)
Admission: RE | Admit: 2020-04-16 | Discharge: 2020-04-16 | Disposition: A | Discharge status: Home / Self Care | Payer: Managed Care, Other (non HMO) | Source: Ambulatory Visit | Attending: Internal Medicine | Admitting: Internal Medicine

## 2020-04-16 DIAGNOSIS — Z01818 Encounter for other preprocedural examination: Secondary | ICD-10-CM | POA: Insufficient documentation

## 2020-04-16 DIAGNOSIS — Z01810 Encounter for preprocedural cardiovascular examination: Secondary | ICD-10-CM | POA: Diagnosis not present

## 2020-04-16 DIAGNOSIS — I701 Atherosclerosis of renal artery: Secondary | ICD-10-CM | POA: Insufficient documentation

## 2020-04-16 MED ORDER — LOSARTAN POTASSIUM 50 MG PO TABS
50.0000 mg | ORAL_TABLET | Freq: Every day | ORAL | 1 refills | Status: DC
Start: 2020-04-16 — End: 2020-06-05

## 2020-04-18 ENCOUNTER — Ambulatory Visit (INDEPENDENT_AMBULATORY_CARE_PROVIDER_SITE_OTHER): Payer: Self-pay | Admitting: Bariatrics

## 2020-04-23 ENCOUNTER — Other Ambulatory Visit (HOSPITAL_COMMUNITY)
Admission: RE | Admit: 2020-04-23 | Discharge: 2020-04-23 | Disposition: A | Discharge status: Home / Self Care | Payer: Managed Care, Other (non HMO) | Source: Ambulatory Visit | Attending: Cardiovascular Disease | Admitting: Cardiovascular Disease

## 2020-04-23 DIAGNOSIS — Z20822 Contact with and (suspected) exposure to covid-19: Secondary | ICD-10-CM | POA: Diagnosis not present

## 2020-04-23 DIAGNOSIS — Z01812 Encounter for preprocedural laboratory examination: Secondary | ICD-10-CM | POA: Diagnosis present

## 2020-04-23 LAB — SARS CORONAVIRUS 2 (TAT 6-24 HRS): SARS Coronavirus 2: NEGATIVE

## 2020-04-24 ENCOUNTER — Telehealth: Payer: Self-pay | Admitting: *Deleted

## 2020-04-24 NOTE — Telephone Encounter (Addendum)
Pt contacted pre-PV procedure  scheduled at [facility] for: Thursday April 26, 2020 9:30 AM Verified arrival time and place: [place]) at: 5:30 AM-pre-procedure hydration   No solid food after midnight prior to cath, clear liquids until 5 AM day of procedure.  Hold: Lasix-AM of procedure Losartan-PM prior/AM of procedure  Except hold medications AM meds can be  taken pre-cath with sips of water including: ASA 81 mg   Confirmed patient has responsible adult to drive home post procedure and be with patient first 24 hours after arriving home: yes  You are allowed ONE visitor in the waiting room during the time you are at the hospital for your procedure. Both you and your visitor must wear a mask once you enter the hospital.       COVID-19 Pre-Screening Questions:  . In the past 14 days have you had any symptoms concerning for COVID-19 infection (fever, chills, cough, or new shortness of breath)? no . In the past 14 days have you been around anyone with known Covid 19? no   Reviewed procedure/mask/visitor instructions, COVID-19 questions with patient, discussed change in arrival time to 5:30 AM to allow time for pre-procedure hydration.

## 2020-04-26 ENCOUNTER — Encounter (HOSPITAL_COMMUNITY)
Admission: RE | Disposition: A | Discharge status: Home / Self Care | Payer: Self-pay | Source: Home / Self Care | Attending: Cardiovascular Disease

## 2020-04-26 ENCOUNTER — Other Ambulatory Visit: Payer: Self-pay

## 2020-04-26 ENCOUNTER — Ambulatory Visit (HOSPITAL_COMMUNITY)
Admission: RE | Admit: 2020-04-26 | Discharge: 2020-04-26 | Disposition: A | Discharge status: Home / Self Care | Payer: Managed Care, Other (non HMO) | Attending: Cardiovascular Disease | Admitting: Cardiovascular Disease

## 2020-04-26 DIAGNOSIS — Z6841 Body Mass Index (BMI) 40.0 and over, adult: Secondary | ICD-10-CM | POA: Insufficient documentation

## 2020-04-26 DIAGNOSIS — Z79899 Other long term (current) drug therapy: Secondary | ICD-10-CM | POA: Insufficient documentation

## 2020-04-26 DIAGNOSIS — I701 Atherosclerosis of renal artery: Secondary | ICD-10-CM | POA: Diagnosis present

## 2020-04-26 DIAGNOSIS — Z885 Allergy status to narcotic agent status: Secondary | ICD-10-CM | POA: Diagnosis not present

## 2020-04-26 DIAGNOSIS — I10 Essential (primary) hypertension: Secondary | ICD-10-CM | POA: Insufficient documentation

## 2020-04-26 HISTORY — PX: PERIPHERAL VASCULAR INTERVENTION: CATH118257

## 2020-04-26 HISTORY — PX: RENAL ANGIOGRAPHY: CATH118260

## 2020-04-26 LAB — POCT ACTIVATED CLOTTING TIME: Activated Clotting Time: 279 seconds

## 2020-04-26 LAB — PREGNANCY, URINE: Preg Test, Ur: NEGATIVE

## 2020-04-26 SURGERY — RENAL ANGIOGRAPHY
Anesthesia: LOCAL | Laterality: Right

## 2020-04-26 MED ORDER — IODIXANOL 320 MG/ML IV SOLN
INTRAVENOUS | Status: DC | PRN
  Administered 2020-04-26: 75 mL via INTRA_ARTERIAL

## 2020-04-26 MED ORDER — HYDRALAZINE HCL 20 MG/ML IJ SOLN
INTRAMUSCULAR | Status: DC | PRN
  Administered 2020-04-26 (×2): 10 mg via INTRAVENOUS

## 2020-04-26 MED ORDER — ONDANSETRON HCL 4 MG/2ML IJ SOLN
4.0000 mg | Freq: Four times a day (QID) | INTRAMUSCULAR | Status: DC | PRN
  Administered 2020-04-26: 4 mg via INTRAVENOUS
  Filled 2020-04-26: qty 2

## 2020-04-26 MED ORDER — ROSUVASTATIN CALCIUM 40 MG PO TABS: 40.0000 mg | ORAL_TABLET | Freq: Every day | ORAL | 3 refills | Status: DC

## 2020-04-26 MED ORDER — MIDAZOLAM HCL 2 MG/2ML IJ SOLN
INTRAMUSCULAR | Status: DC | PRN
  Administered 2020-04-26: 1 mg via INTRAVENOUS

## 2020-04-26 MED ORDER — FENTANYL CITRATE (PF) 100 MCG/2ML IJ SOLN
INTRAMUSCULAR | Status: DC | PRN
  Administered 2020-04-26: 25 ug via INTRAVENOUS

## 2020-04-26 MED ORDER — HYDRALAZINE HCL 20 MG/ML IJ SOLN: 5.0000 mg | INTRAMUSCULAR | Status: DC | PRN

## 2020-04-26 MED ORDER — SODIUM CHLORIDE 0.9% FLUSH: 3.0000 mL | INTRAVENOUS | Status: DC | PRN

## 2020-04-26 MED ORDER — CLOPIDOGREL BISULFATE 75 MG PO TABS: 75.0000 mg | ORAL_TABLET | Freq: Every day | ORAL | Status: DC

## 2020-04-26 MED ORDER — CLOPIDOGREL BISULFATE 300 MG PO TABS
ORAL_TABLET | ORAL | Status: AC
  Filled 2020-04-26: qty 1

## 2020-04-26 MED ORDER — CLOPIDOGREL BISULFATE 75 MG PO TABS: 75.0000 mg | ORAL_TABLET | Freq: Every day | ORAL | 3 refills | Status: DC

## 2020-04-26 MED ORDER — LIDOCAINE HCL (PF) 1 % IJ SOLN
INTRAMUSCULAR | Status: AC
  Filled 2020-04-26: qty 30

## 2020-04-26 MED ORDER — SODIUM CHLORIDE 0.9 % IV SOLN: INTRAVENOUS | Status: DC

## 2020-04-26 MED ORDER — FENTANYL CITRATE (PF) 100 MCG/2ML IJ SOLN
INTRAMUSCULAR | Status: AC
  Filled 2020-04-26: qty 2

## 2020-04-26 MED ORDER — SODIUM CHLORIDE 0.9% FLUSH: 3.0000 mL | Freq: Two times a day (BID) | INTRAVENOUS | Status: DC

## 2020-04-26 MED ORDER — SODIUM CHLORIDE 0.9 % IV SOLN: 250.0000 mL | INTRAVENOUS | Status: DC | PRN

## 2020-04-26 MED ORDER — ACETAMINOPHEN 325 MG PO TABS
650.0000 mg | ORAL_TABLET | ORAL | Status: DC | PRN
  Administered 2020-04-26: 650 mg via ORAL

## 2020-04-26 MED ORDER — ACETAMINOPHEN 325 MG PO TABS
ORAL_TABLET | ORAL | Status: AC
  Filled 2020-04-26: qty 2

## 2020-04-26 MED ORDER — HEPARIN SODIUM (PORCINE) 1000 UNIT/ML IJ SOLN
INTRAMUSCULAR | Status: DC | PRN
  Administered 2020-04-26: 15000 [IU] via INTRAVENOUS

## 2020-04-26 MED ORDER — SODIUM CHLORIDE 0.9 % WEIGHT BASED INFUSION
3.0000 mL/kg/h | INTRAVENOUS | Status: DC
  Administered 2020-04-26: 3 mL/kg/h via INTRAVENOUS

## 2020-04-26 MED ORDER — HEPARIN (PORCINE) IN NACL 1000-0.9 UT/500ML-% IV SOLN
INTRAVENOUS | Status: AC
  Filled 2020-04-26: qty 500

## 2020-04-26 MED ORDER — LIDOCAINE HCL (PF) 1 % IJ SOLN
INTRAMUSCULAR | Status: DC | PRN
  Administered 2020-04-26: 20 mL

## 2020-04-26 MED ORDER — HEPARIN (PORCINE) IN NACL 1000-0.9 UT/500ML-% IV SOLN
INTRAVENOUS | Status: DC | PRN
  Administered 2020-04-26 (×2): 500 mL

## 2020-04-26 MED ORDER — CLOPIDOGREL BISULFATE 300 MG PO TABS
ORAL_TABLET | ORAL | Status: DC | PRN
  Administered 2020-04-26: 300 mg via ORAL

## 2020-04-26 MED ORDER — SODIUM CHLORIDE 0.9 % WEIGHT BASED INFUSION
1.0000 mL/kg/h | INTRAVENOUS | Status: DC
  Administered 2020-04-26 (×2): 1 mL/kg/h via INTRAVENOUS

## 2020-04-26 MED ORDER — ASPIRIN EC 81 MG PO TBEC: 81.0000 mg | DELAYED_RELEASE_TABLET | Freq: Every day | ORAL | Status: DC

## 2020-04-26 MED ORDER — HEPARIN SODIUM (PORCINE) 1000 UNIT/ML IJ SOLN
INTRAMUSCULAR | Status: AC
  Filled 2020-04-26: qty 2

## 2020-04-26 MED ORDER — ASPIRIN 81 MG PO CHEW
81.0000 mg | CHEWABLE_TABLET | ORAL | Status: AC
  Administered 2020-04-26: 81 mg via ORAL
  Filled 2020-04-26: qty 1

## 2020-04-26 MED ORDER — SODIUM CHLORIDE 0.9 % IV SOLN: INTRAVENOUS | Status: AC

## 2020-04-26 MED ORDER — MIDAZOLAM HCL 2 MG/2ML IJ SOLN
INTRAMUSCULAR | Status: AC
  Filled 2020-04-26: qty 2

## 2020-04-26 MED ORDER — LABETALOL HCL 5 MG/ML IV SOLN: 10.0000 mg | INTRAVENOUS | Status: DC | PRN

## 2020-04-26 MED FILL — CLOPIDOGREL 75 MG TABLET: 75 | 30 days supply | Qty: 30 | Fill #0

## 2020-04-26 MED FILL — ROSUVASTATIN CALCIUM 40 MG: 40 | 30 days supply | Qty: 30 | Fill #0

## 2020-04-26 SURGICAL SUPPLY — 17 items
CATH ANGIO 5F PIGTAIL 65CM (CATHETERS) ×3 IMPLANT
CLOSURE PERCLOSE PROSTYLE (VASCULAR PRODUCTS) ×3 IMPLANT
FILTER CO2 0.2 MICRON (VASCULAR PRODUCTS) ×3 IMPLANT
GUIDE CATH VISTA JR4 6F (CATHETERS) ×3 IMPLANT
KIT ENCORE 26 ADVANTAGE (KITS) ×3 IMPLANT
KIT PV (KITS) ×3 IMPLANT
RESERVOIR CO2 (VASCULAR PRODUCTS) ×3 IMPLANT
SET FLUSH CO2 (MISCELLANEOUS) ×3 IMPLANT
SHEATH PINNACLE 6F 10CM (SHEATH) ×3 IMPLANT
SHEATH PROBE COVER 6X72 (BAG) ×3 IMPLANT
STENT HERCULINK RX 6.0X15X135 (Permanent Stent) ×3 IMPLANT
STOPCOCK MORSE 400PSI 3WAY (MISCELLANEOUS) ×3 IMPLANT
TRANSDUCER W/STOPCOCK (MISCELLANEOUS) ×3 IMPLANT
TRAY PV CATH (CUSTOM PROCEDURE TRAY) ×3 IMPLANT
TUBING CIL FLEX 10 FLL-RA (TUBING) ×3 IMPLANT
WIRE HITORQ VERSACORE ST 145CM (WIRE) ×6 IMPLANT
WIRE STABILIZER XS .014X180CM (WIRE) ×3 IMPLANT

## 2020-04-26 NOTE — Discharge Summary (Addendum)
Discharge Summary for Same Day PV   Patient ID: Heidi Rivera MRN: [id number]; DOB: [birth year]  Admit date: 04/26/2020 Discharge date: 04/26/2020  Primary Care Provider: [name], DO  Primary Cardiologist: Freada Bergeron, MD  Primary Electrophysiologist:  None   Discharge Diagnoses    Active Problems:   Renal artery stenosis [facility])    Diagnostic Studies/Procedures    PV/renal angiogram 04/26/2020:  Angiographic Data:    1: Abdominal aorta-widely patent with no atherosclerosis noted 2: Right renal artery-widely patent 3: Left renal artery-60 to 70% proximal left renal artery stenosis with a 20 to 30 mm pullback gradient   IMPRESSION: Heidi Rivera has a significant proximal left renal artery stenosis.  This may be contributing to her resistant hypertension on 3 antihypertensive medications.  We will proceed with PTA and stenting.  Final Impression: Successful left renal artery PTA and stent using a 6 mm x 15 mm long balloon expandable stent for what appeared to be a hemodynamically significant proximal left renal artery stenosis in the setting of presumed renal vascular hypertension.  Patient did receive 300 mg of Plavix.  She had hemostasis achieved in her groin with Mynx closure device.  She will be hydrated for the next 6 hours given her moderate renal insufficiency and discharged home on dual antiplatelet therapy.  We will arrange renal artery Doppler studies in our [facility]  office next week and I will see her back 1 to 2 weeks thereafter.   Heidi Rivera. MD, Heidi Rivera 04/26/2020 11:12 AM  _____________   History of Present Illness     Heidi Rivera is a 54 y.o. female who was referred to Dr. Gwenlyn Found by [name] for diagnosis and treatment of presumed renal vascular hypertension.  She has been hypertensive for last 10 years and has not had documented renal insufficiency for at least a year.  She is on 3 antihypertensive medications with blood  pressures that run in the 200/100 range at home.  She does get a occasional chest pressure.  She denies being diabetic nor does she smoke.  There is no family history for heart disease.  She is never had a heart attack or stroke.  She did have a MRI angiogram performed 09/10/2019 that suggested bilateral renal artery stenosis. Given this finding she was set up for outpatient PV/renal angiogram.   Hospital Course     The patient underwent PV/renal angiogram with Dr. Gwenlyn Found noted above with successful left renal artery PTA and stenting with 4mx15mm stent. She received 3032mof plavix during procedure. Plan for DAPT with ASA/plavix for at least one year. She was hydrated post procedure while in short stay. No complications noted. Able to ambulate without difficulty. Femoral cath site stable at the time of discharge.   Heidi Kissleras seen by Dr. BeGwenlyn Foundnd determined stable for discharge home. Follow up with our office has been arranged. Medications are listed below. Pertinent changes include addition of ASA, plavix and statin was increased from Crestor 10 to 4039maily. _____________   Discharge Vitals Blood pressure (!) 153/93, pulse 70, temperature (!) 97.3 F (36.3 C), temperature source Oral, resp. rate 13, height 5' 9"  (1.753 m), weight (!) 141.1 kg, last menstrual period 04/19/2020, SpO2 100 %.  Filed Weights   04/26/20 0709  Weight: (!) 141.1 kg    Last Labs & Radiologic Studies    CBC No results for input(s): WBC, NEUTROABS, HGB, HCT, MCV, PLT in the last 72 hours. Basic Metabolic Panel No results  for input(s): NA, K, CL, CO2, GLUCOSE, BUN, CREATININE, CALCIUM, MG, PHOS in the last 72 hours. Liver Function Tests No results for input(s): AST, ALT, ALKPHOS, BILITOT, PROT, ALBUMIN in the last 72 hours. No results for input(s): LIPASE, AMYLASE in the last 72 hours. High Sensitivity Troponin:   No results for input(s): TROPONINIHS in the last 720 hours.  BNP Invalid input(s):  POCBNP D-Dimer No results for input(s): DDIMER in the last 72 hours. Hemoglobin A1C No results for input(s): HGBA1C in the last 72 hours. Fasting Lipid Panel No results for input(s): CHOL, HDL, LDLCALC, TRIG, CHOLHDL, LDLDIRECT in the last 72 hours. Thyroid Function Tests No results for input(s): TSH, T4TOTAL, T3FREE, THYROIDAB in the last 72 hours.  Invalid input(s): FREET3 _____________  PERIPHERAL VASCULAR CATHETERIZATION  Result Date: 04/26/2020  [id number] LOCATION:  FACILITY: [facility] PHYSICIAN: Heidi Rivera, M.D. 1965/05/28 DATE OF PROCEDURE:  04/26/2020 DATE OF DISCHARGE: PV Angiogram/Intervention History obtained from chart review.Heidi Rivera is a 54 y.o. morbidly overweight single African-American female mother of 3 children, grandmother of 52 grandchildren who is accompanied by Heidi Rivera, one of her daughters.  She is referred by [name] for diagnosis and treatment of presumed renal vascular hypertension.  She has been hypertensive for last 10 years and has not had documented renal insufficiency for at least a year.  She is on 3 antihypertensive medications with blood pressures that run in the 200/100 range at home.  She does get a occasional chest pressure.  She is not diabetic nor does she smoke.  There is no family history for heart disease.  She is never had a heart attack or stroke.  She did have a MRI angiogram performed 09/10/2019 that suggested bilateral renal artery stenosis. Pre Procedure Diagnosis: Renal vascular hypertension Post Procedure Diagnosis: Renal vascular hypertension Operators: Dr. Quay Rivera Procedures Performed:  1.  Ultrasound-guided right common femoral access  2.  CO2 abdominal aortogram  3.  Selective right and left renal artery angiogram  4.  PTA and stenting proximal left renal artery stenosis  5.  Mynx closure right common femoral artery PROCEDURE DESCRIPTION: The patient was brought to the second floor [facility] cath lab in the the  postabsorptive state. She was premedicated with IV Versed and fentanyl. Her right groin was prepped and shaved in usual sterile fashion. Xylocaine 1% was used for local anesthesia. A 6 French sheath was inserted into the right common femoral artery using standard Seldinger technique.  Ultrasound was used to identify the artery but unfortunately the artery was too deep and a sheath was placed manually.  5 French pigtail catheter was placed in the mid abdominal aorta.  Abdominal aortography was performed using CO2 angiography.  A 6 French short JR4 guide catheter was used for selective right and left renal artery angiography.  On the pigtail was used for the entirety of the case.  Retroaortic pressures monitored to the case.  Angiographic Data: 1: Abdominal aorta-widely patent with no atherosclerosis noted 2: Right renal artery-widely patent 3: Left renal artery-60 to 70% proximal left renal artery stenosis with a 20 to 30 mm pullback gradient   Heidi Rivera has a significant proximal left renal artery stenosis.  This may be contributing to her resistant hypertension on 3 antihypertensive medications.  We will proceed with PTA and stenting. Procedure Description: Patient received a total of 15,000 of heparin with an ACT of 279.  Using a 6 [place] JR4 guide catheter along with a 0.14 stabilizer wire the proximal left  renal artery stenosis was primarily stented with a 6 mm x 15 mm long balloon expandable stent at 10 atm (5.6 mm resulting reduction of a 60 to 70% stenosis to 0% residual.  The patient tolerated procedure well.  The guidewire and catheter were removed.  The right common femoral angiogram was performed and a Mynx closure device successfully deployed achieving hemostasis.  Patient did receive 300 mg of p.o. Plavix.. Final Impression: Successful left renal artery PTA and stent using a 6 mm x 15 mm long balloon expandable stent for what appeared to be a hemodynamically significant proximal left renal artery  stenosis in the setting of presumed renal vascular hypertension.  Patient did receive 300 mg of Plavix.  She had hemostasis achieved in her groin with Mynx closure device.  She will be hydrated for the next 6 hours given her moderate renal insufficiency and discharged home on dual antiplatelet therapy.  We will arrange renal artery Doppler studies in our [facility]  office next week and I will see her back 1 to 2 weeks thereafter. Heidi Rivera. MD, [facility] 04/26/2020 11:12 AM   ECHOCARDIOGRAM COMPLETE  Result Date: 04/03/2020    ECHOCARDIOGRAM REPORT   Patient Name:   Heidi Rivera Date of Exam: 04/03/2020 Medical Rec #:  [id number]              Height:       69.5 in Accession #:    [id number]             Weight:       311.6 lb Date of Birth:  07/03/1965              BSA:          2.507 m Patient Age:    54 years               BP:           188/109 mmHg Patient Gender: F                      HR:           72 bpm. Exam Location:  [place] Procedure: 2D Echo, 3D Echo, Cardiac Doppler and Color Doppler Indications:    R06.00 SOB  History:        Patient has no prior history of Echocardiogram examinations.                 Signs/Symptoms:Chest Pain; Risk Factors:CKD, Obesity and                 Hypertension.  Sonographer:    Marygrace Drought RCS Referring Phys: [id number] [place]  1. Left ventricular ejection fraction, by estimation, is 65 to 70%. The left ventricle has normal function. The left ventricle has no regional wall motion abnormalities. There is moderate concentric left ventricular hypertrophy. Left ventricular diastolic parameters are consistent with Grade I diastolic dysfunction (impaired relaxation). Elevated left atrial pressure. The average left ventricular global longitudinal strain is -18.1 %. The global longitudinal strain is normal.  2. Right ventricular systolic function is normal. The right ventricular size is normal. There is normal pulmonary artery systolic pressure.   3. The mitral valve is normal in structure. Trivial mitral valve regurgitation. No evidence of mitral stenosis.  4. The aortic valve is normal in structure. Aortic valve regurgitation is not visualized. No aortic stenosis is present.  5. The inferior vena cava is normal in size with  greater than 50% respiratory variability, suggesting right atrial pressure of 3 mmHg. FINDINGS  Left Ventricle: Left ventricular ejection fraction, by estimation, is 65 to 70%. The left ventricle has normal function. The left ventricle has no regional wall motion abnormalities. The average left ventricular global longitudinal strain is -18.1 %. The global longitudinal strain is normal. The left ventricular internal cavity size was normal in size. There is moderate concentric left ventricular hypertrophy. Left ventricular diastolic parameters are consistent with Grade I diastolic dysfunction (impaired relaxation). Elevated left atrial pressure. Right Ventricle: The right ventricular size is normal. No increase in right ventricular wall thickness. Right ventricular systolic function is normal. There is normal pulmonary artery systolic pressure. The tricuspid regurgitant velocity is 1.52 m/s, and  with an assumed right atrial pressure of 3 mmHg, the estimated right ventricular systolic pressure is 78.9 mmHg. Left Atrium: Left atrial size was normal in size. Right Atrium: Right atrial size was normal in size. Pericardium: There is no evidence of pericardial effusion. Mitral Valve: The mitral valve is normal in structure. Trivial mitral valve regurgitation. No evidence of mitral valve stenosis. Tricuspid Valve: The tricuspid valve is normal in structure. Tricuspid valve regurgitation is mild . No evidence of tricuspid stenosis. Aortic Valve: The aortic valve is normal in structure. Aortic valve regurgitation is not visualized. No aortic stenosis is present. Pulmonic Valve: The pulmonic valve was normal in structure. Pulmonic valve  regurgitation is not visualized. No evidence of pulmonic stenosis. Aorta: The aortic root is normal in size and structure. Venous: The inferior vena cava is normal in size with greater than 50% respiratory variability, suggesting right atrial pressure of 3 mmHg. IAS/Shunts: No atrial level shunt detected by color flow Doppler.  LEFT VENTRICLE PLAX 2D LVIDd:         4.40 cm  Diastology LVIDs:         2.50 cm  LV e' medial:    5.44 cm/s LV PW:         1.20 cm  LV E/e' medial:  12.8 LV IVS:        1.20 cm  LV e' lateral:   10.30 cm/s LVOT diam:     2.00 cm  LV E/e' lateral: 6.7 LV SV:         71 LV SV Index:   28       2D Longitudinal Strain LVOT Area:     3.14 cm 2D Strain GLS Avg:     -18.1 %  RIGHT VENTRICLE RV Basal diam:  3.00 cm RV S prime:     11.90 cm/s TAPSE (M-mode): 2.4 cm RVSP:           12.2 mmHg LEFT ATRIUM             Index       RIGHT ATRIUM           Index LA diam:        3.70 cm 1.48 cm/m  RA Pressure: 3.00 mmHg LA Vol (A2C):   42.8 ml 17.07 ml/m RA Area:     12.70 cm LA Vol (A4C):   31.2 ml 12.45 ml/m RA Volume:   27.70 ml  11.05 ml/m LA Biplane Vol: 36.4 ml 14.52 ml/m  AORTIC VALVE LVOT Vmax:   106.00 cm/s LVOT Vmean:  80.700 cm/s LVOT VTI:    0.227 m  AORTA Ao Root diam: 3.50 cm Ao Asc diam:  3.90 cm MITRAL VALVE  TRICUSPID VALVE MV Area (PHT):             TR Peak grad:   9.2 mmHg MV Decel Time:             TR Vmax:        152.00 cm/s MV E velocity: 69.40 cm/s  Estimated RAP:  3.00 mmHg MV A velocity: 74.60 cm/s  RVSP:           12.2 mmHg MV E/A ratio:  0.93                            SHUNTS                            Systemic VTI:  0.23 m                            Systemic Diam: 2.00 cm Ena Dawley MD Electronically signed by Ena Dawley MD Signature Date/Time: 04/03/2020/10:44:44 AM    Final    VAS US RENAL ARTERY DUPLEX  Result Date: 04/16/2020 ABDOMINAL VISCERAL Indications: Renal artery stenosis. Patient denies any abdominal pain. High Risk Factors:  Hypertension, no history of smoking. Limitations: Air/bowel gas and obesity. Comparison Study: No prior renal artery duplex. In 08/2019, a MR Angio Abdomen                   showed short segment stenosis of the right renal artery origin                   and suggestion of a distal right renal artery stenosis and                   short segment stenosis of the left renal artery origin. Performing Technologist: Sharlett Iles RVT  Examination Guidelines: A complete evaluation includes B-mode imaging, spectral Doppler, color Doppler, and power Doppler as needed of all accessible portions of each vessel. Bilateral testing is considered an integral part of a complete examination. Limited examinations for reoccurring indications may be performed as noted.  Duplex Findings: +--------------------+--------+--------+------+----------------------+ Mesenteric          PSV cm/sEDV cm/sPlaque       Comments        +--------------------+--------+--------+------+----------------------+ Aorta Prox             71                 2.3 cm AP x 2.3 cm TRV +--------------------+--------+--------+------+----------------------+ Aorta Distal           86                                        +--------------------+--------+--------+------+----------------------+ Celiac Artery Origin  120      31                                +--------------------+--------+--------+------+----------------------+ SMA Origin            157      21                                +--------------------+--------+--------+------+----------------------+  +------------------+--------+--------+-------+ Right Renal  ArteryPSV cm/sEDV cm/sComment +------------------+--------+--------+-------+ Origin              283      30           +------------------+--------+--------+-------+ Proximal            132      35           +------------------+--------+--------+-------+ Mid                 117      38            +------------------+--------+--------+-------+ Distal               88      30           +------------------+--------+--------+-------+ +-----------------+--------+--------+-------+ Left Renal ArteryPSV cm/sEDV cm/sComment +-----------------+--------+--------+-------+ Origin             325      90           +-----------------+--------+--------+-------+ Proximal           237      55           +-----------------+--------+--------+-------+ Mid                138      25           +-----------------+--------+--------+-------+ Distal              84      27           +-----------------+--------+--------+-------+ +------------+--------+--------+----+-----------+--------+--------+----+ Right KidneyPSV cm/sEDV cm/sRI  Left KidneyPSV cm/sEDV cm/sRI   +------------+--------+--------+----+-----------+--------+--------+----+ Upper Pole  13      5       0.60Upper Pole 17      6       0.63 +------------+--------+--------+----+-----------+--------+--------+----+ Mid         14      5       0.63Mid        14      5       0.62 +------------+--------+--------+----+-----------+--------+--------+----+ Lower Pole  16      6       0.62Lower Pole 18      8       0.59 +------------+--------+--------+----+-----------+--------+--------+----+ Hilar       28      10      0.64Hilar      36      14      0.61 +------------+--------+--------+----+-----------+--------+--------+----+ +------------------+-------+------------------+-------+ Right Kidney             Left Kidney               +------------------+-------+------------------+-------+ RAR                      RAR                       +------------------+-------+------------------+-------+ RAR (manual)      3.99   RAR (manual)      4.58    +------------------+-------+------------------+-------+ Cortex                   Cortex                     +------------------+-------+------------------+-------+ Cortex thickness  0.82 mmCorex thickness   0.90 mm +------------------+-------+------------------+-------+ Kidney length (cm)9.80   Kidney length (cm)10.00   +------------------+-------+------------------+-------+  Summary: Largest Aortic Diameter: 2.3  cm  Renal:  Right: Normal size right kidney. Normal right Resistive Index.        Normal cortical thickness of right kidney. Evidence of a        greater than 60% stenosis of the right renal artery. RRV flow        present. Unable to determine stenosis in the distal segment        as seen on the MR Angio. Left:  Normal size of left kidney. Normal left Resistive Index.        Normal cortical thickness of the left kidney. Evidence of a >        60% stenosis in the left renal artery. Mesenteric: Normal Celiac artery and Superior Mesenteric artery findings.  Patent IVC.  *See table(s) above for measurements and observations.  Vascular consult recommended. Diagnosing physician: Heidi Burow MD  Electronically signed by Heidi Burow MD on 04/16/2020 at 3:11:38 PM.    Final     Disposition   Pt is being discharged home today in good condition.  Follow-up Plans & Appointments     Follow-up Information     [name], MD Follow up on 06/05/2020.   Specialties: Cardiology, Radiology Why: at 2:15pm for your follow up appt Contact information: [address] [place] [address] [place] [facility] Follow up on 05/09/2020.   Specialty: Cardiology Why: at 11am for your follow up doppler. Please nothing to eat or drink after midnight! Contact information: [address] [place] [phone]                   Discharge Medications   Allergies as of 04/26/2020       Reactions   Tramadol Other (See Comments)   Intolerance : GI Upset        Medication List     TAKE these  medications    acetaminophen 500 MG tablet Commonly known as: TYLENOL Take 1 tablet (500 mg total) by mouth every 6 (six) hours as needed. What changed: reasons to take this   Aimovig 70 MG/ML Soaj Generic drug: Erenumab-aooe Inject 70 mg into the skin every 28 (twenty-eight) days.   albuterol 108 (90 Base) MCG/ACT inhaler Commonly known as: VENTOLIN HFA Inhale 2 puffs into the lungs every 4 (four) hours as needed for wheezing or shortness of breath.   amLODipine 10 MG tablet Commonly known as: NORVASC Take 1 tablet (10 mg total) by mouth at bedtime. What changed: when to take this   Blood Pressure Cuff Misc 1 Device by Does not apply route daily.   carvedilol 6.25 MG tablet Commonly known as: COREG Take 1 tablet (6.25 mg total) by mouth 2 (two) times daily.   clopidogrel 75 MG tablet Commonly known as: Plavix Take 1 tablet (75 mg total) by mouth daily.   cyclobenzaprine 10 MG tablet Commonly known as: FLEXERIL Take 1 tablet (10 mg total) by mouth 3 (three) times daily as needed for muscle spasms.   furosemide 40 MG tablet Commonly known as: LASIX Take 40 mg by mouth 2 (two) times daily.   losartan 100 MG tablet Commonly known as: COZAAR Take 100 mg by mouth at bedtime. What changed: Another medication with the same name was changed. Make sure you understand how and when to take each.   losartan 50 MG tablet Commonly known as: COZAAR Take 1 tablet (50 mg total) by mouth daily.  Per patient taking 50 mg in the morning and 100 mg at night What changed: additional instructions   rosuvastatin 40 MG tablet Commonly known as: Crestor Take 1 tablet (40 mg total) by mouth daily. What changed:  medication strength how much to take   SUMAtriptan 100 MG tablet Commonly known as: IMITREX Take 1 tablet earliest onset of migraine.  May repeat in 2 hours if headache persists or recurs.  Maximum 2 tablets in 24 hours. What changed:  how much to take how to take this when  to take this reasons to take this   Vitamin D (Ergocalciferol) 1.25 MG (50000 UNIT) Caps capsule Commonly known as: DRISDOL Take 50,000 Units by mouth every Monday.           Allergies Allergies  Allergen Reactions   Tramadol Other (See Comments)    Intolerance : GI Upset    Outstanding Labs/Studies   Renal artery doppler  Duration of Discharge Encounter   Greater than 30 minutes including physician time.  Signed, Reino Bellis, NP 04/26/2020, 2:19 PM   Agree with note by Reino Bellis NP-C  Heidi Rivera was admitted as an outpatient for CO2 abdominal aortography, renal angiography plus or minus renal artery stenting for presumed renal artery stenosis/renal vessel hypertension.  Her right renal artery is completely normal.  Her left had a 70% stenosis which underwent direct stenting with excellent result.  I performed perclosure of her right common femoral artery successfully.  She was kept for hydration and was discharged home.  Plan is for renal Doppler studies next week after which she will see me back in the office.  [name], M.D., [place], [facility], [place] [address] [place], [place]  [address]  [phone] 04/26/2020 4:42 PM

## 2020-04-26 NOTE — Discharge Instructions (Signed)
Femoral Site Care This sheet gives you information about how to care for yourself after your procedure. Your health care provider may also give you more specific instructions. If you have problems or questions, contact your health care provider. What can I expect after the procedure?  After the procedure, it is common to have:  Bruising that usually fades within 1-2 weeks.  Tenderness at the site. Follow these instructions at home: Wound care 1. Follow instructions from your health care provider about how to take care of your insertion site. Make sure you: ? Wash your hands with soap and water before you change your bandage (dressing). If soap and water are not available, use hand sanitizer. ? Remove your dressing as told by your health care provider. In 24 hours 2. Do not take baths, swim, or use a hot tub until your health care provider approves. 3. You may shower 24-48 hours after the procedure or as told by your health care provider. ? Gently wash the site with plain soap and water. ? Pat the area dry with a clean towel. ? Do not rub the site. This may cause bleeding. 4. Do not apply powder or lotion to the site. Keep the site clean and dry. 5. Check your femoral site every day for signs of infection. Check for: ? Redness, swelling, or pain. ? Fluid or blood. ? Warmth. ? Pus or a bad smell. Activity 1. For the first 2-3 days after your procedure, or as long as directed: ? Avoid climbing stairs as much as possible. ? Do not squat. 2. Do not lift anything that is heavier than 10 lb (4.5 kg), or the limit that you are told, until your health care provider says that it is safe. For 5 days 3. Rest as directed. ? Avoid sitting for a long time without moving. Get up to take short walks every 1-2 hours. 4. Do not drive for 24 hours if you were given a medicine to help you relax (sedative). General instructions  Take over-the-counter and prescription medicines only as told by your  health care provider.  Keep all follow-up visits as told by your health care provider. This is important. Contact a health care provider if you have:  A fever or chills.  You have redness, swelling, or pain around your insertion site. Get help right away if:  The catheter insertion area swells very fast.  You pass out.  You suddenly start to sweat or your skin gets clammy.  The catheter insertion area is bleeding, and the bleeding does not stop when you hold steady pressure on the area.  The area near or just beyond the catheter insertion site becomes pale, cool, tingly, or numb. These symptoms may represent a serious problem that is an emergency. Do not wait to see if the symptoms will go away. Get medical help right away. Call your local emergency services (911 in the [place] Do not drive yourself to the hospital. Summary  After the procedure, it is common to have bruising that usually fades within 1-2 weeks.  Check your femoral site every day for signs of infection.  Do not lift anything that is heavier than 10 lb (4.5 kg), or the limit that you are told, until your health care provider says that it is safe. This information is not intended to replace advice given to you by your health care provider. Make sure you discuss any questions you have with your health care provider. Document Revised: 05/25/2017 Document Reviewed: 05/25/2017   Elsevier Patient Education  2020 [employer]   

## 2020-04-26 NOTE — Interval H&P Note (Signed)
History and Physical Interval Note:  04/26/2020 9:54 AM  Heidi Rivera  has presented today for surgery, with the diagnosis of renal stenosis.  The various methods of treatment have been discussed with the patient and family. After consideration of risks, benefits and other options for treatment, the patient has consented to  Procedure(s): RENAL ANGIOGRAPHY (Right) as a surgical intervention.  The patient's history has been reviewed, patient examined, no change in status, stable for surgery.  I have reviewed the patient's chart and labs.  Questions were answered to the patient's satisfaction.     Quay Burow

## 2020-04-26 NOTE — Progress Notes (Signed)
Orders clarified with Dr. Gwenlyn Found- for discharge but post procedure hydration required. To PSS rfa site level \\zero 

## 2020-04-27 ENCOUNTER — Encounter (HOSPITAL_COMMUNITY): Payer: Self-pay | Admitting: Cardiovascular Disease

## 2020-05-02 ENCOUNTER — Other Ambulatory Visit (HOSPITAL_COMMUNITY): Payer: Self-pay | Admitting: Cardiovascular Disease

## 2020-05-02 DIAGNOSIS — I701 Atherosclerosis of renal artery: Secondary | ICD-10-CM

## 2020-05-09 ENCOUNTER — Other Ambulatory Visit: Payer: Self-pay

## 2020-05-09 ENCOUNTER — Ambulatory Visit (HOSPITAL_COMMUNITY)
Admission: RE | Admit: 2020-05-09 | Discharge: 2020-05-09 | Disposition: A | Discharge status: Home / Self Care | Payer: Managed Care, Other (non HMO) | Source: Ambulatory Visit | Attending: Cardiology | Admitting: Cardiology

## 2020-05-09 ENCOUNTER — Other Ambulatory Visit (HOSPITAL_COMMUNITY): Payer: Self-pay | Admitting: Cardiovascular Disease

## 2020-05-09 DIAGNOSIS — Z9889 Other specified postprocedural states: Secondary | ICD-10-CM

## 2020-05-09 DIAGNOSIS — I701 Atherosclerosis of renal artery: Secondary | ICD-10-CM | POA: Insufficient documentation

## 2020-05-17 ENCOUNTER — Other Ambulatory Visit: Payer: Self-pay | Admitting: Family Medicine

## 2020-05-17 ENCOUNTER — Encounter: Payer: Self-pay | Admitting: Family Medicine

## 2020-05-17 MED ORDER — ALBUTEROL SULFATE HFA 108 (90 BASE) MCG/ACT IN AERS: 2.0000 | INHALATION_SPRAY | RESPIRATORY_TRACT | 1 refills | Status: DC | PRN

## 2020-05-17 MED ORDER — ALBUTEROL SULFATE HFA 108 (90 BASE) MCG/ACT IN AERS: 2.0000 | INHALATION_SPRAY | RESPIRATORY_TRACT | 0 refills | Status: DC | PRN

## 2020-05-17 NOTE — Telephone Encounter (Signed)
Called patient to discuss myChart message. Patient does not appear to be in acute distress. States that she has noticed having to using propping of pillows to be able to sleep at night and feels that asthma is being exacerbated.   ED precautions given. Will send to preceptor due to nature of request.   Talbot Grumbling, RN

## 2020-05-17 NOTE — Progress Notes (Signed)
Contacted patient in order to follow-up on MyChart message concerning wheezing and request for an inhaler.  Patient states that she has been experiencing increased wheezing for the past few days.  She denies any fevers.  Patient states the wheezing has been bothering her at night.  She describes some chest tightness.  Advised patient to go to emergency department if this continues.  Informed patient that I will refill albuterol inhaler with advised to be seen in the ED urgently if she has to use albuterol around-the-clock for persistent wheezing.  Patient voiced understanding and reassured me that she would seek evaluation if symptoms worsened.

## 2020-05-29 ENCOUNTER — Encounter: Payer: Self-pay | Admitting: Family Medicine

## 2020-05-29 DIAGNOSIS — I129 Hypertensive chronic kidney disease with stage 1 through stage 4 chronic kidney disease, or unspecified chronic kidney disease: Secondary | ICD-10-CM | POA: Diagnosis not present

## 2020-05-30 ENCOUNTER — Other Ambulatory Visit: Payer: Self-pay

## 2020-05-30 ENCOUNTER — Ambulatory Visit (INDEPENDENT_AMBULATORY_CARE_PROVIDER_SITE_OTHER): Payer: Self-pay | Admitting: Family Medicine

## 2020-05-30 VITALS — Ht 69.0 in | Wt 315.0 lb

## 2020-05-30 DIAGNOSIS — R42 Dizziness and giddiness: Secondary | ICD-10-CM | POA: Diagnosis not present

## 2020-05-30 HISTORY — DX: Dizziness and giddiness: R42

## 2020-05-30 MED ORDER — MECLIZINE HCL 12.5 MG PO TABS: 12.5000 mg | ORAL_TABLET | Freq: Three times a day (TID) | ORAL | 0 refills | Status: DC | PRN

## 2020-05-30 NOTE — Assessment & Plan Note (Addendum)
The differential at this time includes orthostatic hypotension, labyrinthitis, new CVA, acute kidney injury, migraine.  Based on history and physical exam, I think that hypotension and labyrinthitis are the most likely contributing factors.  Orthostatic vitals conducted this visit did show a drop in systolic blood pressure by 32 mmHg.  This may be related to her recent stenting of the renal artery and her body adjusting to lower blood pressures.  She does note some mildly worsened dizziness when moving from a seated to a standing position although this does not account for nystagmus.  Based on the fatiguing properties of her nystagmus, I think this is most likely a labyrinthitis, not requiring brain imaging at this time.  For now, we will follow-up with basic lab work to ensure appropriate kidney function and treat her symptomatically with meclizine for dizziness and nausea.  No focal deficits on exam concerning for CVA. -Follow-up CBC, BMP -Meclizine for nausea

## 2020-05-30 NOTE — Telephone Encounter (Signed)
Returned call to patient to discuss symptoms further. Patient reports the following time line of events.  4 am: "felt like I lost balance".  7 am: Dizziness and nausea when getting ready for work.   Patient was seen at kidney specialist- yesterday 140/80 sitting, 180/100 standing. Reports taking BP medication as prescribed. Unable to check BP with home cuff due to machine not working.   Denies any sick symptoms or sick exposures.   Reports HA this morning, however, history of migraines. No current CP or SHOB. Scheduled patient appointment this afternoon for close follow up. Strict ED precautions given in the meantime.   Talbot Grumbling, RN

## 2020-05-30 NOTE — Progress Notes (Addendum)
SUBJECTIVE:   CHIEF COMPLAINT / HPI:   Dizziness Heidi Rivera presents to clinic today to discuss dizziness/balance impairment for the past day.  She reports she first started feeling dizzy about 4 AM yesterday morning.  Later on the morning, this progressed to some mild nausea and persistent dizziness.  Overall, her dizziness is improved slightly although it is still present.  She feels like she is walking in a cloud.  It does not feel like the room is spinning around her.  She has not had any episodes of emesis.  Though she has a history of migraines, she has not had any migraine in the past several days.  She does currently have a throbbing headache which you like her typical migraine.  She denies any vision changes.  She specifically denies any new numbness or weakness of any of her extremities.  She has not had any trouble talking.  She is currently taking clopidogrel due to her recent renal artery stenting.  She specifically denies any recent illness, fevers, congestion, cough, shortness of breath, palpitations, chest pain.  PERTINENT  PMH / PSH: CKD stage III (renal artery stenosis), recent stenting of her renal artery  OBJECTIVE:   Ht 5\' 9"  (1.753 m)   Wt (!) 315 lb (142.9 kg)   LMP 05/16/2020   BMI 46.52 kg/m    Orthostatic vitals are notable for: Supine: 160/80, HR 59 Seated: 140/90, HR 68 Standing: 128/82, HR 78  General: Alert and cooperative and appears to be in no acute distress.  Able to climb up on the exam table and walk around the exam room without significant difficulty. HEENT: Tympanic membranes visualized bilaterally and normal.  Normal oropharynx.  No cervical lymphadenopathy.  Pupils equal, round and reactive to light and accommodation.  No conjunctival hemorrhage, sclera normal.  Extraocular muscles intact.  See cranial nerve exam below for additional information. Cardio: Normal S1 and S2, no S3 or S4. Rhythm is regular. No murmurs or rubs.   Pulm: Clear  to auscultation bilaterally, no crackles, wheezing, or diminished breath sounds. Normal respiratory effort Abdomen: Bowel sounds normal. Abdomen soft and non-tender.  Extremities: No peripheral edema. Warm/ well perfused.  Strong radial pulses.  Neuro: Cranial nerves II through XII intact except for the right beating horizontal nystagmus noted with rightward gaze.  This was repeatable although did seem to fatigue with repeat exams.  Heel-to-shin testing was normal bilaterally as was finger-to-nose testing.  She was able to walk normally around the exam room.  She did have difficulty with walking heel-to-toe and needed to hold onto counters/tables to feel steady.  She was able to stand straight up with her eyes closed for a period of about 10 seconds with some swaying. Epley maneuver was performed with her facing toward the right-no evidence of nystagmus with this exam.  ASSESSMENT/PLAN:   Dizziness The differential at this time includes orthostatic hypotension, labyrinthitis, new CVA, acute kidney injury, migraine.  Based on history and physical exam, I think that hypotension and labyrinthitis are the most likely contributing factors.  Orthostatic vitals conducted this visit did show a drop in systolic blood pressure by 32 mmHg.  This may be related to her recent stenting of the renal artery and her body adjusting to lower blood pressures.  She does note some mildly worsened dizziness when moving from a seated to a standing position although this does not account for nystagmus.  Based on the fatiguing properties of her nystagmus, I think this is most  likely a labyrinthitis, not requiring brain imaging at this time.  For now, we will follow-up with basic lab work to ensure appropriate kidney function and treat her symptomatically with meclizine for dizziness and nausea. -Follow-up CBC, BMP -Meclizine for nausea      Matilde Haymaker, MD [place]

## 2020-05-30 NOTE — Patient Instructions (Addendum)
It was great to see you today.  Here is a quick review of the things we talked about:   Dizziness: I think that your low blood pressures are contributing to your symptoms right now although I am not positive that is the only issue.  For now, we are going to get some blood work.  I would like you to drink plenty of water in the next day or 2 and we will make sure that there is nothing else going on we should be concerned about.  I would also like to decrease your blood pressure medication.   There may be a component of your dizziness that is also related to labyrinthitis which is also called acoustic neuritis.  This will get better with time.  I have sent in pills of meclizine which can help with dizziness and nausea.  If all of your labs are normal, I will send you a message over my chart or send you a letter.  If there is anything to discuss, I will give you a phone call.

## 2020-05-31 ENCOUNTER — Encounter: Payer: Self-pay | Admitting: Family Medicine

## 2020-05-31 LAB — BASIC METABOLIC PANEL
BUN/Creatinine Ratio: 10 (ref 9–23)
BUN: 19 mg/dL (ref 6–24)
CO2: 26 mmol/L (ref 20–29)
Calcium: 8.9 mg/dL (ref 8.7–10.2)
Chloride: 106 mmol/L (ref 96–106)
Creatinine, Ser: 1.95 mg/dL — ABNORMAL HIGH (ref 0.57–1.00)
GFR calc Af Amer: 33 mL/min/{1.73_m2} — ABNORMAL LOW (ref >59)
GFR calc non Af Amer: 29 mL/min/{1.73_m2} — ABNORMAL LOW (ref >59)
Glucose: 96 mg/dL (ref 65–99)
Potassium: 4 mmol/L (ref 3.5–5.2)
Sodium: 142 mmol/L (ref 134–144)

## 2020-05-31 LAB — CBC
Hematocrit: 38 % (ref 34.0–46.6)
Hemoglobin: 12.7 g/dL (ref 11.1–15.9)
MCH: 28.3 pg (ref 26.6–33.0)
MCHC: 33.4 g/dL (ref 31.5–35.7)
MCV: 85 fL (ref 79–97)
Platelets: 195 10*3/uL (ref 150–450)
RBC: 4.48 x10E6/uL (ref 3.77–5.28)
RDW: 13.7 % (ref 11.7–15.4)
WBC: 6.4 10*3/uL (ref 3.4–10.8)

## 2020-06-05 ENCOUNTER — Ambulatory Visit: Payer: Managed Care, Other (non HMO) | Admitting: Cardiovascular Disease

## 2020-06-05 ENCOUNTER — Ambulatory Visit (INDEPENDENT_AMBULATORY_CARE_PROVIDER_SITE_OTHER): Payer: Self-pay | Admitting: Cardiovascular Disease

## 2020-06-05 ENCOUNTER — Other Ambulatory Visit: Payer: Self-pay

## 2020-06-05 ENCOUNTER — Encounter: Payer: Self-pay | Admitting: Cardiovascular Disease

## 2020-06-05 DIAGNOSIS — I701 Atherosclerosis of renal artery: Secondary | ICD-10-CM

## 2020-06-05 DIAGNOSIS — I10 Essential (primary) hypertension: Secondary | ICD-10-CM

## 2020-06-05 MED ORDER — AMLODIPINE BESYLATE 5 MG PO TABS: 5.0000 mg | ORAL_TABLET | Freq: Every day | ORAL | 3 refills | Status: DC

## 2020-06-05 NOTE — Assessment & Plan Note (Signed)
Ms. Totten returns today for follow-up of her recent left renal artery stent which I performed 04/26/2020 for renal vessel hypertension.  Her blood pressures have been much easier to control and fewer medications since that time.  Actually she has been somewhat hypotensive.  Her losartan has been discontinued.  Blood pressure today is 136/84.  I am going to decrease her amlodipine from 10 to 5 mg a day.  We will get annual renal Doppler studies.  I will see her back as needed.

## 2020-06-05 NOTE — Progress Notes (Signed)
06/05/2020 Heidi Rivera   05/31/1965  [id number]  Primary Physician [name], DO Primary Cardiologist: [name] MD Garret [facility], [place], [place]  HPI:  Heidi Rivera is a 55 y.o.  morbidly overweight single African-American female mother of 3 children, grandmother of 8 grandchildren who I last saw in the office 04/13/2020.Heidi Rivera  She is referred by [name] for diagnosis and treatment of presumed renal vascular hypertension.  She has been hypertensive for last 10 years and has not had documented renal insufficiency for at least a year.  She is on 3 antihypertensive medications with blood pressures that run in the 200/100 range at home.  She does get a occasional chest pressure.  She is not diabetic nor does she smoke.  There is no family history for heart disease.  She is never had a heart attack or stroke.  She did have a MRI angiogram performed 09/10/2019 that suggested bilateral renal artery stenosis.  I performed abdominal aortography and selective renal artery angiography on her 04/26/2020 revealing a 60 to 70% ostial left renal artery stenosis with a 20 to 30 mm pullback gradient.  I performed PTA and stenting using a 6 mm x 15 mm long Herculink balloon expandable stent with an excellent result.  Her follow-up Doppler studies performed 05/09/2020 revealed a widely patent left renal artery stent.  Her blood pressures have been under much better control on fewer medications.   Current Meds  Medication Sig  . acetaminophen (TYLENOL) 500 MG tablet Take 1 tablet (500 mg total) by mouth every 6 (six) hours as needed. (Patient taking differently: Take 500 mg by mouth every 6 (six) hours as needed (for pain.).)  . albuterol (VENTOLIN HFA) 108 (90 Base) MCG/ACT inhaler Inhale 2 puffs into the lungs every 4 (four) hours as needed for wheezing or shortness of breath.  Heidi Rivera amLODipine (NORVASC) 10 MG tablet Take 1 tablet (10 mg total) by mouth at bedtime. (Patient taking  differently: Take 10 mg by mouth daily.)  . Blood Pressure Monitoring (BLOOD PRESSURE CUFF) MISC 1 Device by Does not apply route daily.  . carvedilol (COREG) 6.25 MG tablet Take 1 tablet (6.25 mg total) by mouth 2 (two) times daily.  . clopidogrel (PLAVIX) 75 MG tablet Take 1 tablet (75 mg total) by mouth daily.  . cyclobenzaprine (FLEXERIL) 10 MG tablet Take 1 tablet (10 mg total) by mouth 3 (three) times daily as needed for muscle spasms.  [name] (AIMOVIG) 70 MG/ML SOAJ Inject 70 mg into the skin every 28 (twenty-eight) days.  . furosemide (LASIX) 40 MG tablet Take 40 mg by mouth 2 (two) times daily.  . meclizine (ANTIVERT) 12.5 MG tablet Take 1 tablet (12.5 mg total) by mouth 3 (three) times daily as needed for dizziness or nausea.  . rosuvastatin (CRESTOR) 40 MG tablet Take 1 tablet (40 mg total) by mouth daily.  . SUMAtriptan (IMITREX) 100 MG tablet Take 1 tablet earliest onset of migraine.  May repeat in 2 hours if headache persists or recurs.  Maximum 2 tablets in 24 hours. (Patient taking differently: Take 100 mg by mouth every 2 (two) hours as needed for migraine. Take 1 tablet earliest onset of migraine.  May repeat in 2 hours if headache persists or recurs.  Maximum 2 tablets in 24 hours.)  . Vitamin D, Ergocalciferol, (DRISDOL) 1.25 MG (50000 UNIT) CAPS capsule Take 50,000 Units by mouth every Monday.      Allergies  Allergen Reactions  . Tramadol  Other (See Comments)    Intolerance : GI Upset    Social History   Socioeconomic History  . Marital status: Single    Spouse name: Not on file  . Number of children: 3  . Years of education: Not on file  . Highest education level: Not on file  Occupational History  . 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 Smoking status: Never Smoker  . Smokeless tobacco: Never Used  Vaping Use  . Vaping Use: Never used  Substance and Sexual Activity  . Alcohol use: Yes    Comment: rare  . Drug use: No  . Sexual activity:  Not on file  Other Topics Concern  . Not on file  Social History Narrative   Lives with sister and her husband who smokes    Social Determinants of Radio broadcast assistant Strain: Not on file  Food Insecurity: Not on file  Transportation Needs: Not on file  Physical Activity: Not on file  Stress: Not on file  Social Connections: Not on file  Intimate Partner Violence: Not on file     Review of Systems: General: negative for chills, fever, night sweats or weight changes.  Cardiovascular: negative for chest pain, dyspnea on exertion, edema, orthopnea, palpitations, paroxysmal nocturnal dyspnea or shortness of breath Dermatological: negative for rash Respiratory: negative for cough or wheezing Urologic: negative for hematuria Abdominal: negative for nausea, vomiting, diarrhea, bright red blood per rectum, melena, or hematemesis Neurologic: negative for visual changes, syncope, or dizziness All other systems reviewed and are otherwise negative except as noted above.    Blood pressure 136/84, pulse 60, height 5\' 9"  (1.753 m), weight (!) 313 lb (142 kg), last menstrual period 05/16/2020.  General appearance: alert and no distress Neck: no adenopathy, no carotid bruit, no JVD, supple, symmetrical, trachea midline and thyroid not enlarged, symmetric, no tenderness/mass/nodules Lungs: clear to auscultation bilaterally Heart: regular rate and rhythm, S1, S2 normal, no murmur, click, rub or gallop Extremities: extremities normal, atraumatic, no cyanosis or edema Pulses: 2+ and symmetric Skin: Skin color, texture, turgor normal. No rashes or lesions Neurologic: Alert and oriented X 3, normal strength and tone. Normal symmetric reflexes. Normal coordination and gait  EKG not performed today  ASSESSMENT AND PLAN:   Renal artery stenosis ([place]) [name] returns today for follow-up of her recent left renal artery stent which I performed 04/26/2020 for renal vessel hypertension.  Her  blood pressures have been much easier to control and fewer medications since that time.  Actually she has been somewhat hypotensive.  Her losartan has been discontinued.  Blood pressure today is 136/84.  I am going to decrease her amlodipine from 10 to 5 mg a day.  We will get annual renal Doppler studies.  I will see her back as needed.      [name] MD FACP,FACC,FAHA, [facility] 06/05/2020 2:56 PM

## 2020-06-05 NOTE — Patient Instructions (Signed)
Medication Instructions:  Start taking Amlodipine (norvasc) 5mg .   *If you need a refill on your cardiac medications before your next appointment, please call your pharmacy*  Testing/Procedures: Your physician has requested that you have a renal artery duplex. During this test, an ultrasound is used to evaluate blood flow to the kidneys. Allow one hour for this exam. Do not eat after midnight the day before and avoid carbonated beverages. Take your medications as you usually do. [place]. [address].  To be done in Dec. 2022.    Follow-Up: At [facility], you and your health needs are our priority.  As part of our continuing mission to provide you with exceptional heart care, we have created designated Provider Care Teams.  These Care Teams include your primary Cardiologist (physician) and Advanced Practice Providers (APPs -  Physician Assistants and Nurse Practitioners) who all work together to provide you with the care you need, when you need it.  We recommend signing up for the patient portal called "MyChart".  Sign up information is provided on this After Visit Summary.  MyChart is used to connect with patients for Virtual Visits (Telemedicine).  Patients are able to view lab/test results, encounter notes, upcoming appointments, etc.  Non-urgent messages can be sent to your provider as well.   To learn more about what you can do with MyChart, go to [email or web address].    Your next appointment:   You may see Dr. Gwenlyn Found as needed. If you need an appointment please feel free to call our office.   Provider:   Quay Burow, MD

## 2020-06-14 NOTE — Progress Notes (Deleted)
Cardiology Office Note:    Date:  06/14/2020   ID:  Heidi Rivera, DOB 09/19/1965, MRN [id number]  PCP:  [name], DO  [facility] Cardiologist:  Freada Bergeron, MD  [facility] Electrophysiologist:  None   Referring MD: [name], DO    History of Present Illness:    Heidi Rivera is a 55 y.o. female with a hx of morbid obesity, CKD stage III, HTN, left renal artery stenosis s/p PTA with stenting with 74m x134mHerculink expandable stent who presented to clinic for follow-up.  Since our last visit, the patient underwent abdominal aortography and selective renal artery angiography on 04/26/2020 which revealed a 60 to 70% ostial left renal artery stenosis with a 20 to 30 mm pullback gradient. Dr. BeGwenlyn Founderformed PTA and stenting using a 6 mm x 15 mm long Herculink balloon expandable stent with excellent angiographic results. Follow-up Doppler studies performed 05/09/2020 revealed a widely patent left renal artery stent. Since the intervention, her blood pressure has been much better controlled.   Past Medical History:  Diagnosis Date  . Acute gastritis   . CHRONIC KIDNEY DISEASE STAGE III (MODERATE) 07/11/2008   Qualifier: Diagnosis of  By: SaTye SavoyD, WeTommi Rumps  . Chronic left-sided low back pain with left-sided sciatica 07/23/2006   Qualifier: Diagnosis of  By: WOHerma Ard  . Costochondritis, acute 06/17/2019  . Essential hypertension, benign 08/30/2007   Qualifier: Diagnosis of  By: RoDarylene PriceD, MaElta Guadeloupe  . Gout   . Hypertension   . Laryngitis 10/13/2019  . Migraine without aura 07/11/2008   Qualifier: Diagnosis of  By: AlCarlena SaxMD, StColletta [place]  . Migraines   . Obesity   . OBESITY, NOS 07/23/2006   Qualifier: Diagnosis of  By: WOHerma Ard  . Stress incontinence 06/17/2019  . Vertigo     Past Surgical History:  Procedure Laterality Date  . HEMORRHOID SURGERY    . PERIPHERAL VASCULAR INTERVENTION  04/26/2020   Procedure: PERIPHERAL  VASCULAR INTERVENTION;  Surgeon: BeLorretta HarpMD;  [facility];  Service: Cardiovascular;;  left renal  . RENAL ANGIOGRAPHY Right 04/26/2020   Procedure: RENAL ANGIOGRAPHY;  Surgeon: BeLorretta HarpMD;  [facility];  Service: Cardiovascular;  Laterality: Right;    Current Medications: No outpatient medications have been marked as taking for the 06/22/20 encounter (Appointment) with PeFreada BergeronMD.     Allergies:   Tramadol   Social History   Socioeconomic History  . Marital status: Single    Spouse name: Not on file  . Number of children: 3  . Years of education: Not on file  . Highest education level: Not on file  Occupational History  . 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 Smoking status: Never Smoker  . Smokeless tobacco: Never Used  Vaping Use  . Vaping Use: Never used  Substance and Sexual Activity  . Alcohol use: Yes    Comment: rare  . Drug use: No  . Sexual activity: Not on file  Other Topics Concern  . Not on file  Social History Narrative   Lives with sister and her husband who smokes    Social Determinants of Health   Financial Resource Strain: Not on file  Food Insecurity: Not on file  Transportation Needs: Not on file  Physical Activity: Not on file  Stress: Not on file  Social Connections: Not on file  Family History: The patient's ***family history includes Breast cancer in her mother; Cancer - Other in her paternal grandmother; Colon cancer in her maternal uncle; Diabetes in her maternal aunt and mother; Hypercholesterolemia in her father; Hypertension in her father; Other in her father and mother.  ROS:   Please see the history of present illness.    *** All other systems reviewed and are negative.  EKGs/Labs/Other Studies Reviewed:    The following studies were reviewed today: Renal aniography 04/26/20: Angiographic Data:  1: Abdominal aorta-widely patent with no atherosclerosis  noted 2: Right renal artery-widely patent 3: Left renal artery-60 to 70% proximal left renal artery stenosis with a 20 to 30 mm pullback gradient  Final Impression: Successful left renal artery PTA and stent using a 6 mm x 15 mm long balloon expandable stent for what appeared to be a hemodynamically significant proximal left renal artery stenosis in the setting of presumed renal vascular hypertension.  Patient did receive 300 mg of Plavix.  She had hemostasis achieved in her groin with Mynx closure device.  She will be hydrated for the next 6 hours given her moderate renal insufficiency and discharged home on dual antiplatelet therapy.  We will arrange renal artery Doppler studies in our [facility]  office next week and I will see her back 1 to 2 weeks thereafter.  Renal ultrasound 05/09/20: Summary:  Largest Aortic Diameter: 2.4 cm  Renal:    Right: Normal size right kidney. Normal right Resistive Index.     Normal cortical thickness of right kidney. No evidence of     right renal artery stenosis. RRV flow present.  Left: Normal size of left kidney. Normal left Resistive Index.     Normal cortical thickness of the left kidney. No evidence of     left renal artery stenosis. LRV flow present. Stent struts     not visualized. Renal artery widely patent without evidence     of stenosis, s/p stent placement. Marked improvement compared     to the prior exam.  Mesenteric:  Normal Celiac artery and Superior Mesenteric artery findings.    Patent IVC.   TTE 04/03/20: IMPRESSIONS  1. Left ventricular ejection fraction, by estimation, is 65 to 70%. The  left ventricle has normal function. The left ventricle has no regional  wall motion abnormalities. There is moderate concentric left ventricular  hypertrophy. Left ventricular  diastolic parameters are consistent with Grade I diastolic dysfunction  (impaired relaxation). Elevated left atrial pressure. The average  left  ventricular global longitudinal strain is -18.1 %. The global longitudinal  strain is normal.  2. Right ventricular systolic function is normal. The right ventricular  size is normal. There is normal pulmonary artery systolic pressure.  3. The mitral valve is normal in structure. Trivial mitral valve  regurgitation. No evidence of mitral stenosis.  4. The aortic valve is normal in structure. Aortic valve regurgitation is  not visualized. No aortic stenosis is present.  5. The inferior vena cava is normal in size with greater than 50%  respiratory variability, suggesting right atrial pressure of 3 mmHg.  EKG:  EKG is *** ordered today.  The ekg ordered today demonstrates ***  Recent Labs: 05/30/2020: BUN 19; Creatinine, Ser 1.95; Hemoglobin 12.7; Platelets 195; Potassium 4.0; Sodium 142  Recent Lipid Panel    Component Value Date/Time   CHOL 198 07/20/2019 1616   TRIG 106 07/20/2019 1616   HDL 60 07/20/2019 1616   CHOLHDL 3.3 07/20/2019 1616   CHOLHDL  3.7 Ratio 09/02/2007 1339   VLDL 18 09/02/2007 1339   LDLCALC 119 (H) 07/20/2019 1616     Risk Assessment/Calculations:   {Does this patient have ATRIAL FIBRILLATION?:[phone]}   Physical Exam:    VS:  There were no vitals taken for this visit.    Wt Readings from Last 3 Encounters:  06/05/20 (!) 313 lb (142 kg)  05/30/20 (!) 315 lb (142.9 kg)  04/26/20 (!) 311 lb (141.1 kg)     GEN: *** Well nourished, well developed in no acute distress HEENT: Normal NECK: No JVD; No carotid bruits LYMPHATICS: No lymphadenopathy CARDIAC: ***RRR, no murmurs, rubs, gallops RESPIRATORY:  Clear to auscultation without rales, wheezing or rhonchi  ABDOMEN: Soft, non-tender, non-distended MUSCULOSKELETAL:  No edema; No deformity  SKIN: Warm and dry NEUROLOGIC:  Alert and oriented x 3 PSYCHIATRIC:  Normal affect   ASSESSMENT:    No diagnosis found. PLAN:    In order of problems listed above:  #Renal Artery  Stenosis #HTN Patient with history of resistant HTN found to have renal artery stenosis on MRI. Abdominal aortography and selective renal artery angiography performed by Dr. Gwenlyn Found on 04/26/2020 revealed a 60 to 70% ostial left renal artery stenosis with a 20 to 30 mm pullback gradient. Now s/p PTA and stenting using a 6 mm x 15 mm long Herculink balloon expandable stent with excellent angiographic results. Follow-up Doppler studies performed 05/09/2020 revealed a widely patent left renal artery stent. Blood pressures now much better controlled.  -Continue plavix -Amlodipine '5mg'$  daily -Continue coreg 6.'25mg'$  BID -Continue losartan '50mg'$  QAM and '100mg'$  QPM and lasix '40mg'$  BID per Nephrology -Needs Sleep Study -Continue crestor '40mg'$  daily given atherosclerosis on MRI as well as multiple risk factors  #Atypical Chest Pain: Patient with the feeling like "something is lodged in her throat" when she lays down at night. Improves with sitting elevated. No exertional symptoms although exercise is limited due to joint pain and recent fall. Symptoms consistent with GERD with low suspicion for angina. -Low suspicion for cardiac etiology -Continue nexium for GERD -Follow-up with PCP as scheduled  #DOE: Suspect deconditioning in the setting of low activity. Poorly controlled HTN may also be contributing. TTE with normal BiV function, G1DD.  -Continue management of HTN  -Weight loss efforts  #Snoring #Suspected OSA: -Sleep study as above  #Morbid Obestiy: BMI 45. Counseled extensively about diet and weight loss today. Very motivated to lose weight and is interested in following up at weight loss clinic. -Refer to weight loss clinic -Discussed mediterranean diet and exercise at length as detailed below   {Are you ordering a CV Procedure (e.g. stress test, cath, DCCV, TEE, etc)?   Press F2        :[id number]    Medication Adjustments/Labs and Tests Ordered: Current medicines are reviewed at length  with the patient today.  Concerns regarding medicines are outlined above.  No orders of the defined types were placed in this encounter.  No orders of the defined types were placed in this encounter.   There are no Patient Instructions on file for this visit.   Signed, Freada Bergeron, MD  06/14/2020 2:56 PM    [place]

## 2020-06-19 ENCOUNTER — Ambulatory Visit: Payer: Self-pay | Admitting: Cardiology

## 2020-06-19 NOTE — Progress Notes (Deleted)
Cardiology Office Note:    Date:  06/19/2020   ID:  Heidi Rivera, DOB [birth year], MRN [id number]  PCP:  [name], DO  [facility] Cardiologist:  Freada Bergeron, MD  [facility] Electrophysiologist:  None   Referring MD: [name], DO    History of Present Illness:    Heidi Rivera is a 55 y.o. female with a hx of morbid obesity, CKD stage III, HTN, left renal artery stenosis s/p PTA with stenting with 71m x135mHerculink expandable stent who presented to clinic for follow-up.  Since our last visit, the patient underwent abdominal aortography and selective renal artery angiography on 04/26/2020 which revealed a 60 to 70% ostial left renal artery stenosis with a 20 to 30 mm pullback gradient. Dr. BeGwenlyn Founderformed PTA and stenting using a 6 mm x 15 mm long Herculink balloon expandable stent with excellent angiographic results. Follow-up Doppler studies performed 05/09/2020 revealed a widely patent left renal artery stent. Since the intervention, her blood pressure has been much better controlled.   Past Medical History:  Diagnosis Date  . Acute gastritis   . CHRONIC KIDNEY DISEASE STAGE III (MODERATE) 07/11/2008   Qualifier: Diagnosis of  By: SaTye SavoyD, WeTommi Rumps  . Chronic left-sided low back pain with left-sided sciatica 07/23/2006   Qualifier: Diagnosis of  By: WOHerma Ard  . Costochondritis, acute 06/17/2019  . Essential hypertension, benign 08/30/2007   Qualifier: Diagnosis of  By: RoDarylene PriceD, MaElta [place]  . Gout   . Hypertension   . Laryngitis 10/13/2019  . Migraine without aura 07/11/2008   Qualifier: Diagnosis of  By: AlCarlena SaxMD, StColletta [place]  . Migraines   . Obesity   . OBESITY, NOS 07/23/2006   Qualifier: Diagnosis of  By: WOHerma Ard  . Stress incontinence 06/17/2019  . Vertigo     Past Surgical History:  Procedure Laterality Date  . HEMORRHOID SURGERY    . PERIPHERAL VASCULAR INTERVENTION  04/26/2020   Procedure:  PERIPHERAL VASCULAR INTERVENTION;  Surgeon: BeLorretta HarpMD;  [facility];  Service: Cardiovascular;;  left renal  . RENAL ANGIOGRAPHY Right 04/26/2020   Procedure: RENAL ANGIOGRAPHY;  Surgeon: BeLorretta HarpMD;  [facility];  Service: Cardiovascular;  Laterality: Right;    Current Medications: No outpatient medications have been marked as taking for the 06/19/20 encounter (Appointment) with PeFreada BergeronMD.     Allergies:   Tramadol   Social History   Socioeconomic History  . Marital status: Single    Spouse name: Not on file  . Number of children: 3  . Years of education: Not on file  . Highest education level: Not on file  Occupational History  . 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 Smoking status: Never Smoker  . Smokeless tobacco: Never Used  Vaping Use  . Vaping Use: Never used  Substance and Sexual Activity  . Alcohol use: Yes    Comment: rare  . Drug use: No  . Sexual activity: Not on file  Other Topics Concern  . Not on file  Social History Narrative   Lives with sister and her husband who smokes    Social Determinants of Health   Financial Resource Strain: Not on file  Food Insecurity: Not on file  Transportation Needs: Not on file  Physical Activity: Not on file  Stress: Not on file  Social Connections: Not on file  Family History: The patient's ***family history includes Breast cancer in her mother; Cancer - Other in her paternal grandmother; Colon cancer in her maternal uncle; Diabetes in her maternal aunt and mother; Hypercholesterolemia in her father; Hypertension in her father; Other in her father and mother.  ROS:   Please see the history of present illness.    *** All other systems reviewed and are negative.  EKGs/Labs/Other Studies Reviewed:    The following studies were reviewed today: Renal aniography 04/26/20: Angiographic Data:  1: Abdominal aorta-widely patent with no  atherosclerosis noted 2: Right renal artery-widely patent 3: Left renal artery-60 to 70% proximal left renal artery stenosis with a 20 to 30 mm pullback gradient  Final Impression:Successful left renal artery PTA and stent using a 6 mm x 15 mm long balloon expandable stent for what appeared to be a hemodynamically significant proximal left renal artery stenosis in the setting of presumed renal vascular hypertension. Patient did receive 300 mg of Plavix. She had hemostasis achieved in her groin with Mynx closure device. She will be hydrated for the next 6 hours given her moderate renal insufficiency and discharged home on dual antiplatelet therapy. We will arrange renal artery Doppler studies in our [facility] office next week and I will see her back 1 to 2 weeks thereafter.  Renal ultrasound 05/09/20: Summary:  Largest Aortic Diameter: 2.4 cm  Renal:    Right: Normal size right kidney. Normal right Resistive Index.     Normal cortical thickness of right kidney. No evidence of     right renal artery stenosis. RRV flow present.  Left: Normal size of left kidney. Normal left Resistive Index.     Normal cortical thickness of the left kidney. No evidence of     left renal artery stenosis. LRV flow present. Stent struts     not visualized. Renal artery widely patent without evidence     of stenosis, s/p stent placement. Marked improvement compared     to the prior exam.  Mesenteric:  Normal Celiac artery and Superior Mesenteric artery findings.    Patent IVC.   TTE 04/03/20: IMPRESSIONS  1. Left ventricular ejection fraction, by estimation, is 65 to 70%. The  left ventricle has normal function. The left ventricle has no regional  wall motion abnormalities. There is moderate concentric left ventricular  hypertrophy. Left ventricular  diastolic parameters are consistent with Grade I diastolic dysfunction  (impaired relaxation). Elevated left atrial  pressure. The average left  ventricular global longitudinal strain is -18.1 %. The global longitudinal  strain is normal.  2. Right ventricular systolic function is normal. The right ventricular  size is normal. There is normal pulmonary artery systolic pressure.  3. The mitral valve is normal in structure. Trivial mitral valve  regurgitation. No evidence of mitral stenosis.  4. The aortic valve is normal in structure. Aortic valve regurgitation is  not visualized. No aortic stenosis is present.  5. The inferior vena cava is normal in size with greater than 50%  respiratory variability, suggesting right atrial pressure of 3 mmHg.  EKG:  EKG is *** ordered today.  The ekg ordered today demonstrates ***  Recent Labs: 05/30/2020: BUN 19; Creatinine, Ser 1.95; Hemoglobin 12.7; Platelets 195; Potassium 4.0; Sodium 142  Recent Lipid Panel    Component Value Date/Time   CHOL 198 07/20/2019 1616   TRIG 106 07/20/2019 1616   HDL 60 07/20/2019 1616   CHOLHDL 3.3 07/20/2019 1616   CHOLHDL 3.7 Ratio 09/02/2007 1339  VLDL 18 09/02/2007 1339   LDLCALC 119 (H) 07/20/2019 1616     Risk Assessment/Calculations:   {Does this patient have ATRIAL FIBRILLATION?[phone]   Physical Exam:    VS:  There were no vitals taken for this visit.    Wt Readings from Last 3 Encounters:  06/05/20 (!) 313 lb (142 kg)  05/30/20 (!) 315 lb (142.9 kg)  04/26/20 (!) 311 lb (141.1 kg)     GEN: *** Well nourished, well developed in no acute distress HEENT: Normal NECK: No JVD; No carotid bruits LYMPHATICS: No lymphadenopathy CARDIAC: ***RRR, no murmurs, rubs, gallops RESPIRATORY:  Clear to auscultation without rales, wheezing or rhonchi  ABDOMEN: Soft, non-tender, non-distended MUSCULOSKELETAL:  No edema; No deformity  SKIN: Warm and dry NEUROLOGIC:  Alert and oriented x 3 PSYCHIATRIC:  Normal affect   ASSESSMENT:    No diagnosis found. PLAN:    In order of problems listed above:  #Renal  Artery Stenosis #HTN Patient with history of resistant HTN found to have renal artery stenosis on MRI. Abdominal aortography and selective renal artery angiography performed by Dr. Gwenlyn Found on 04/26/2020 revealed a 60 to 70% ostial left renal artery stenosis with a 20 to 30 mm pullback gradient. Now s/p PTA and stenting using a 6 mm x 15 mm long Herculink balloon expandable stent with excellent angiographic results. Follow-up Doppler studies performed 05/09/2020 revealed a widely patent left renal artery stent. Blood pressures now much better controlled.  -Continue plavix -Amlodipine '5mg'$  daily -Continue coreg 6.'25mg'$  BID -Continue losartan '50mg'$  QAM and '100mg'$  QPM and lasix '40mg'$  BID per Nephrology -Needs Sleep Study -Continue crestor '40mg'$  daily given atherosclerosis on MRI as well as multiple risk factors  #Atypical Chest Pain: Patient with the feeling like "something is lodged in her throat" when she lays down at night. Improves with sitting elevated. No exertional symptoms although exercise is limited due to joint pain and recent fall. Symptoms consistent with GERD with low suspicion for angina. -Low suspicion for cardiac etiology -Continue nexium for GERD -Follow-up with PCP as scheduled  #DOE: Suspect deconditioning in the setting of low activity. Poorly controlled HTN may also be contributing. TTE with normal BiV function, G1DD.  -Continue management of HTN  -Weight loss efforts  #Snoring #Suspected OSA: -Sleep study as above  #Morbid Obestiy: BMI 45.Counseled extensively about diet and weight loss today. Very motivated to lose weight and is interested in following up at weight loss clinic. -Refer to weight loss clinic -Discussed mediterranean diet and exercise at length as detailed below    {Are you ordering a CV Procedure (e.g. stress test, cath, DCCV, TEE, etc)?   Press F2        :[id number]    Medication Adjustments/Labs and Tests Ordered: Current medicines are reviewed at  length with the patient today.  Concerns regarding medicines are outlined above.  No orders of the defined types were placed in this encounter.  No orders of the defined types were placed in this encounter.   There are no Patient Instructions on file for this visit.   Signed, Freada Bergeron, MD  06/19/2020 9:39 AM    [facility]

## 2020-06-22 ENCOUNTER — Ambulatory Visit: Payer: Self-pay | Admitting: Cardiology

## 2020-07-23 ENCOUNTER — Encounter: Payer: Self-pay | Admitting: Family Medicine

## 2020-07-23 ENCOUNTER — Ambulatory Visit (INDEPENDENT_AMBULATORY_CARE_PROVIDER_SITE_OTHER): Payer: Self-pay | Admitting: Family Medicine

## 2020-07-23 ENCOUNTER — Other Ambulatory Visit: Payer: Self-pay

## 2020-07-23 VITALS — BP 145/80 | HR 57 | Ht 69.5 in | Wt 321.2 lb

## 2020-07-23 DIAGNOSIS — I10 Essential (primary) hypertension: Secondary | ICD-10-CM | POA: Insufficient documentation

## 2020-07-23 DIAGNOSIS — E559 Vitamin D deficiency, unspecified: Secondary | ICD-10-CM

## 2020-07-23 DIAGNOSIS — L299 Pruritus, unspecified: Secondary | ICD-10-CM | POA: Insufficient documentation

## 2020-07-23 DIAGNOSIS — N3946 Mixed incontinence: Secondary | ICD-10-CM

## 2020-07-23 DIAGNOSIS — B353 Tinea pedis: Secondary | ICD-10-CM | POA: Insufficient documentation

## 2020-07-23 DIAGNOSIS — I150 Renovascular hypertension: Secondary | ICD-10-CM

## 2020-07-23 HISTORY — DX: Tinea pedis: B35.3

## 2020-07-23 LAB — POCT URINALYSIS DIP (MANUAL ENTRY)
Bilirubin, UA: NEGATIVE
Glucose, UA: NEGATIVE mg/dL
Ketones, POC UA: NEGATIVE mg/dL
Leukocytes, UA: NEGATIVE
Nitrite, UA: NEGATIVE
Protein Ur, POC: 30 mg/dL — AB
Spec Grav, UA: 1.025 (ref 1.010–1.025)
Urobilinogen, UA: 0.2 E.U./dL
pH, UA: 5.5 (ref 5.0–8.0)

## 2020-07-23 MED ORDER — OXYBUTYNIN CHLORIDE 5 MG PO TABS: 5.0000 mg | ORAL_TABLET | Freq: Two times a day (BID) | ORAL | 2 refills | Status: DC

## 2020-07-23 MED ORDER — KETOCONAZOLE 2 % EX CREA: 1.0000 "application " | TOPICAL_CREAM | Freq: Every day | CUTANEOUS | 0 refills | Status: DC

## 2020-07-23 NOTE — Assessment & Plan Note (Addendum)
Uncontrolled. Continue pelvic floor exercises for stress incontience. Start oxybutynin 5 mg BID for urge incontinence.  Follow up in 1 month.  Consider pessary versus urology referral.  At follow-up, consider pelvic exam to evaluate for prolapse.

## 2020-07-23 NOTE — Assessment & Plan Note (Addendum)
Patient had stent in left renal artery. BP is much better controlled with her current meds though not at goal. Follows with nephrology for CKD and cardiology for renal artery stenosis. Cardiology discontinued Losartan. Remains on amlodipine, Lasix and Coreg.   Defer changes to medication to cardiology for now.  -CMP and lipid panel today

## 2020-07-23 NOTE — Progress Notes (Signed)
   SUBJECTIVE:   CHIEF COMPLAINT / HPI:   Chief Complaint  Patient presents with  . bladder issues  . skin itching     Heidi Rivera is a 55 y.o. female here for incontinence, itching and foot rash.   Itching  Pt reports excessive itching that started months ago. She feels like something is crawling on her. She had been moisturizing but that is not helping. No one else has similar sx.  She notes that her grandkids do sleep in her bed.   Incontience  Reports incontinence use to only be when she sneezed or laughed.  However, now it happens when she is seated and rolled to get out of the bed. Denies abdominal pain, dsyuria or blood in urine. Having to change her clothes at work. She wears incontinence pads but has to change them multiple times a day.   Foot rash Right foot itching started rash since Dec. Tried Lomitrim with some relief but it spread up her leg. No similar sx previously.    PERTINENT  PMH / PSH: reviewed and updated as appropriate   OBJECTIVE:   BP (!) 145/80   Pulse (!) 57   Ht 5' 9.5" (1.765 m)   Wt (!) 321 lb 3.2 oz (145.7 kg)   LMP 07/16/2020 (Approximate)   SpO2 99%   BMI 46.75 kg/m    GEN: well appearing female in no acute distress  CVS: well perfused  RESP: speaking in full sentences without pause, no respiratory distress  SKIN: right mid foot with hyperpigmented patch 1.5-2 cm, no crusting, no erythema, right lower leg with confluence of small patches 1.5 cm irregular shaped that are hyperpigmented, no visible excoriations        ASSESSMENT/PLAN:   Hypertension Patient had stent in left renal artery. BP is much better controlled with her current meds though not at goal. Follows with nephrology for CKD and cardiology for renal artery stenosis. Cardiology discontinued Losartan. Remains on amlodipine, Lasix and Coreg.   Defer changes to medication to cardiology for now.  -CMP and lipid panel today  Mixed incontinence urge and  stress Uncontrolled. Continue pelvic floor exercises for stress incontience. Start oxybutynin 5 mg BID for urge incontinence.  Follow up in 1 month.  Consider pessary versus urology referral.  At follow-up, consider pelvic exam to evaluate for prolapse.   Vitamin D deficiency Recheck Vit D.   Itching No bug bites to suspect bed bugs. Can not rule out scabies. Will obtain CMP to evaluate for metabolic causes. Follow up in 1 month. Consider atarax, if CMP normal.   Tinea pedis of right foot Ketoconazole cream daily for 14 days        [name], DO PGY-2, [facility] 07/23/2020

## 2020-07-23 NOTE — Assessment & Plan Note (Signed)
Ketoconazole cream daily for 14 days

## 2020-07-23 NOTE — Assessment & Plan Note (Signed)
Recheck Vit D.

## 2020-07-23 NOTE — Assessment & Plan Note (Signed)
No bug bites to suspect bed bugs. Can not rule out scabies. Will obtain CMP to evaluate for metabolic causes. Follow up in 1 month. Consider atarax, if CMP normal.

## 2020-07-23 NOTE — Patient Instructions (Addendum)
It was great seeing you today!  Please check-out at the front desk before leaving the clinic. I'd like to see you back in 1 month for follow up but if you need to be seen earlier than that for any new issues we're happy to fit you in, just give [place] a call!  Visit Remembers: - Stop by the pharmacy to pick up your prescriptions  - Continue to work on your healthy eating habits and incorporating exercise into your daily life.  - Your goal is to have an BP < 120 /80  - Medicine Changes:   For your incontinence: Start oxybutynin for your incontinence take this medication twice a day.  For your foot rash: Stop by the pharmacy to pick up the anti-fungal cream.  For your pills: follow up with local pharmacy to get them pre-packaged. See Handout provided.  For itching:  We got some lab work today. If this is normal I will send in some medication to your pharmacy that can help with itching.     Regarding lab work today:  Due to recent changes in healthcare laws, you may see the results of your imaging and laboratory studies on MyChart before your provider has had a chance to review them.  I understand that in some cases there may be results that are confusing or concerning to you. Not all laboratory results come back in the same time frame and you may be waiting for multiple results in order to interpret others.  Please give [place] 72 hours in order for your provider to thoroughly review all the results before contacting the office for clarification of your results. If everything is normal, you will get a letter in the mail or a message in My Chart. Please give [place] a call if you do not hear from [place] after 2 weeks.  Please bring all of your medications with you to each visit.    If you haven't already, sign up for My Chart to have easy access to your labs results, and communication with your primary care physician.  Feel free to call with any questions or concerns at any time, at [phone].   Take care,   Dr. Rushie [facility]

## 2020-07-24 LAB — COMPREHENSIVE METABOLIC PANEL
ALT: 25 IU/L (ref 0–32)
AST: 22 IU/L (ref 0–40)
Albumin/Globulin Ratio: 1.5 (ref 1.2–2.2)
Albumin: 4 g/dL (ref 3.8–4.9)
Alkaline Phosphatase: 76 IU/L (ref 44–121)
BUN/Creatinine Ratio: 9 (ref 9–23)
BUN: 18 mg/dL (ref 6–24)
Bilirubin Total: 0.2 mg/dL (ref 0.0–1.2)
CO2: 23 mmol/L (ref 20–29)
Calcium: 9.3 mg/dL (ref 8.7–10.2)
Chloride: 105 mmol/L (ref 96–106)
Creatinine, Ser: 1.99 mg/dL — ABNORMAL HIGH (ref 0.57–1.00)
Globulin, Total: 2.6 g/dL (ref 1.5–4.5)
Glucose: 95 mg/dL (ref 65–99)
Potassium: 4.1 mmol/L (ref 3.5–5.2)
Sodium: 144 mmol/L (ref 134–144)
Total Protein: 6.6 g/dL (ref 6.0–8.5)
eGFR: 29 mL/min/{1.73_m2} — ABNORMAL LOW (ref >59)

## 2020-07-24 LAB — LIPID PANEL
Chol/HDL Ratio: 3.9 ratio (ref 0.0–4.4)
Cholesterol, Total: 216 mg/dL — ABNORMAL HIGH (ref 100–199)
HDL: 56 mg/dL (ref >39)
LDL Chol Calc (NIH): 127 mg/dL — ABNORMAL HIGH (ref 0–99)
Triglycerides: 186 mg/dL — ABNORMAL HIGH (ref 0–149)
VLDL Cholesterol Cal: 33 mg/dL (ref 5–40)

## 2020-07-24 LAB — VITAMIN D 25 HYDROXY (VIT D DEFICIENCY, FRACTURES): Vit D, 25-Hydroxy: 20 ng/mL — ABNORMAL LOW (ref 30.0–100.0)

## 2020-08-01 NOTE — Progress Notes (Deleted)
NEUROLOGY FOLLOW UP OFFICE NOTE  Heidi Rivera [id number]  Assessment/Plan:   1.  Migraine without aura, without status migrainosus, not intractable 2.  Acute left lower back pain with left lumbar radiculitis  ***  Subjective:  Heidi Rivera is a 73 year oldright-handed black female with HTN and CKD stage III who follows up for migraines and low back pain.  UPDATE: Aimovig starated in April. Intensity:  *** Duration:  *** Frequency:  *** Frequency of abortive medication: *** Current NSAIDS:  none Current analgesics:  none Current triptans:  Sumatriptan '100mg'$  Current ergotamine:  none Current anti-emetic:  Zofran ODT '4mg'$  Current muscle relaxants:  none Current anti-anxiolytic:  none Current sleep aide:  none Current Antihypertensive medications:  Amlodipine; losartan  Current Antidepressant medications:  none Current Anticonvulsant medications:  none Current anti-CGRP:  Aimovig '70mg'$  Q4wks Current Vitamins/Herbal/Supplements:  none Current Antihistamines/Decongestants:  none Other therapy:  none Hormone/birth control:  none  For further evaluation of low back pain, she underwent MRI lumbar spine without contrast on 02/03/2020 which was personally reviewed and showed mild multilevel degenerative changes and facet arthropathy with mild canal stenosis at L2-3 and L3-4 but no significant canal or foraminal stenosis.  ***.  She went to the ED on 02/19/2020 after a fall and landing on her buttock and twisting left ankle.  She had X-ray of the left ankle, personally reviewed, which showed soft tissue swelling about the ankle without acute fracture or dislocation.  X-ray of sacrum/coccyx was negative.  Diagnosed with sprain.  Caffeine:  No coffee or soda. Diet:  Water.  1 bottle of iced green tea.  Seeing a dietician.   Exercise:  Not routine Depression:  some; Anxiety:  some Other pain:  no Sleep:  Okay.  Questionable OSA as she has daytime  fatigue.  HISTORY: She has had migraines since her early 27s.  They are severe bifrontal-temporal pressure over her eyes.  There is associated nausea, decreased appetite, photophobia, phonophobia, osmophobia and neck stiffness but no vomiting, speech disturbance, weakness or numbness.  Over the past year, she was having uncontrolled hypertension and was diagnosed with CKD stage 3.  Migraines started to become less severe but associated with visual aura of seeing bright spots in her vision.  They were lasting 3 to 4 days.  Sumatriptan is helpful, in which she gets relief in 30-60 minutes and completely resolves in about 1 to 2 days.  In past 3 weeks, she has had 3 migraines.  The smell of perfume and onions trigger them.  She was previously taking Excedrin Migraine frequently but stopped as it contributed to her kidney dysfunction.  Past NSAIDS:  Ibuprofen, naproxen Past analgesics:  Excedrin Migraine (effective); acetaminophen; tramadol Past abortive triptans:  none Past abortive ergotamine:  none Past muscle relaxants:  none Past anti-emetic:  none Past antihypertensive medications:  none Past antidepressant medications:  Amitriptyline '10mg'$  (caused elevated blood pressure) Past anticonvulsant medications:  topiramate Past anti-CGRP:  none Past vitamins/Herbal/Supplements:  none Past antihistamines/decongestants:  Flonase Other past therapies:  none  In mid-August 2021, she developed left lower back pain.  No preceding trauma or precipitating physical activity.  Prolonged sitting aggravated it.  Pain progressively got worse, causing numbness and tingling along the lateral and anterior left thigh down lateral leg and over dorsum of foot with associated shooting pain.  No bowel or bladder dysfunction, saddle anesthesia or focal lower extremity weakness.  However, due to severe pain, she was unable to walk and was stuck  on the toilet for 45 minutes because she couldn't stand.  She went to the ED on  01/13/2020 where she was prescribed Flexeril and a prednisone taper.  She was subsequently able to ambulate but still with pain.  Migraines are well-controlled.  PAST MEDICAL HISTORY: Past Medical History:  Diagnosis Date  . Acute gastritis   . CHRONIC KIDNEY DISEASE STAGE III (MODERATE) 07/11/2008   Qualifier: Diagnosis of  By: Tye Savoy MD, Tommi Rumps    . Chronic left-sided low back pain with left-sided sciatica 07/23/2006   Qualifier: Diagnosis of  By: Herma Ard    . Costochondritis, acute 06/17/2019  . Essential hypertension, benign 08/30/2007   Qualifier: Diagnosis of  By: Darylene Price MD, Elta Guadeloupe    . Gout   . Hypertension   . Laryngitis 10/13/2019  . Migraine without aura 07/11/2008   Qualifier: Diagnosis of  By: Carlena Sax  MD, Colletta [place]    . Migraines   . Obesity   . OBESITY, NOS 07/23/2006   Qualifier: Diagnosis of  By: Herma Ard    . Stress incontinence 06/17/2019  . Vertigo     MEDICATIONS: Current Outpatient Medications on File Prior to Visit  Medication Sig Dispense Refill  . acetaminophen (TYLENOL) 500 MG tablet Take 1 tablet (500 mg total) by mouth every 6 (six) hours as needed. (Patient taking differently: Take 500 mg by mouth every 6 (six) hours as needed (for pain.).) 30 tablet 0  . albuterol (VENTOLIN HFA) 108 (90 Base) MCG/ACT inhaler Inhale 2 puffs into the lungs every 4 (four) hours as needed for wheezing or shortness of breath. 1 each 1  . amLODipine (NORVASC) 5 MG tablet Take 1 tablet (5 mg total) by mouth at bedtime. 90 tablet 3  . Blood Pressure Monitoring (BLOOD PRESSURE CUFF) MISC 1 Device by Does not apply route daily. 1 each 0  . carvedilol (COREG) 6.25 MG tablet Take 1 tablet (6.25 mg total) by mouth 2 (two) times daily. 180 tablet 3  . clopidogrel (PLAVIX) 75 MG tablet Take 1 tablet (75 mg total) by mouth daily. 90 tablet 3  . cyclobenzaprine (FLEXERIL) 10 MG tablet Take 1 tablet (10 mg total) by mouth 3 (three) times daily as needed for muscle spasms. 20  tablet 0  . Erenumab-aooe (AIMOVIG) 70 MG/ML SOAJ Inject 70 mg into the skin every 28 (twenty-eight) days. 1 pen 11  . furosemide (LASIX) 40 MG tablet Take 40 mg by mouth 2 (two) times daily.    [name] ketoconazole (NIZORAL) 2 % cream Apply 1 application topically daily. For 14 days 15 g 0  . meclizine (ANTIVERT) 12.5 MG tablet Take 1 tablet (12.5 mg total) by mouth 3 (three) times daily as needed for dizziness or nausea. 30 tablet 0  . oxybutynin (DITROPAN) 5 MG tablet Take 1 tablet (5 mg total) by mouth 2 (two) times daily. 30 tablet 2  . rosuvastatin (CRESTOR) 40 MG tablet Take 1 tablet (40 mg total) by mouth daily. 90 tablet 3  . SUMAtriptan (IMITREX) 100 MG tablet Take 1 tablet earliest onset of migraine.  May repeat in 2 hours if headache persists or recurs.  Maximum 2 tablets in 24 hours. (Patient taking differently: Take 100 mg by mouth every 2 (two) hours as needed for migraine. Take 1 tablet earliest onset of migraine.  May repeat in 2 hours if headache persists or recurs.  Maximum 2 tablets in 24 hours.) 10 tablet 3  . Vitamin D, Ergocalciferol, (DRISDOL) 1.25 MG (50000 UNIT) CAPS capsule Take 50,000  Units by mouth every Monday.     . [DISCONTINUED] fluticasone (FLONASE) 50 MCG/ACT nasal spray Place 2 sprays into both nostrils daily. 1 g 0   No current facility-administered medications on file prior to visit.    ALLERGIES: Allergies  Allergen Reactions  . Tramadol Other (See Comments)    Intolerance : GI Upset    FAMILY HISTORY: Family History  Problem Relation Age of Onset  . Diabetes Mother   . Breast cancer Mother   . Other Mother        Larynx cancer  . Diabetes Maternal Aunt   . Hypertension Father   . Hypercholesterolemia Father   . Other Father        DDD  . Cancer - Other Paternal Grandmother        bone  . Colon cancer Maternal Uncle       Objective:  *** General: No acute distress.  Patient appears ***-groomed.   Head:  Normocephalic/atraumatic Eyes:  Fundi  examined but not visualized Neck: supple, no paraspinal tenderness, full range of motion Heart:  Regular rate and rhythm Lungs:  Clear to auscultation bilaterally Back: No paraspinal tenderness Neurological Exam: alert and oriented to person, place, and time. Attention span and concentration intact, recent and remote memory intact, fund of knowledge intact.  Speech fluent and not dysarthric, language intact.  CN II-XII intact. Bulk and tone normal, muscle strength 5/5 throughout.  Sensation to light touch, temperature and vibration intact.  Deep tendon reflexes 2+ throughout, toes downgoing.  Finger to nose and heel to shin testing intact.  Gait normal, Romberg negative.     Metta Clines, DO  CC: ***

## 2020-08-02 ENCOUNTER — Encounter: Payer: Self-pay | Admitting: Neurology

## 2020-08-02 ENCOUNTER — Ambulatory Visit: Payer: Managed Care, Other (non HMO) | Admitting: Neurology

## 2020-08-02 DIAGNOSIS — Z029 Encounter for administrative examinations, unspecified: Secondary | ICD-10-CM

## 2020-08-13 ENCOUNTER — Ambulatory Visit (HOSPITAL_COMMUNITY)
Admission: RE | Admit: 2020-08-13 | Discharge: 2020-08-13 | Disposition: A | Discharge status: Home / Self Care | Payer: BC Managed Care – PPO | Source: Ambulatory Visit | Attending: Family Medicine | Admitting: Family Medicine

## 2020-08-13 ENCOUNTER — Other Ambulatory Visit: Payer: Self-pay

## 2020-08-13 ENCOUNTER — Encounter: Payer: Self-pay | Admitting: Family Medicine

## 2020-08-13 ENCOUNTER — Ambulatory Visit (INDEPENDENT_AMBULATORY_CARE_PROVIDER_SITE_OTHER): Payer: Self-pay | Admitting: Family Medicine

## 2020-08-13 VITALS — BP 155/75 | HR 74 | Ht 69.6 in | Wt 329.2 lb

## 2020-08-13 DIAGNOSIS — M79661 Pain in right lower leg: Secondary | ICD-10-CM

## 2020-08-13 DIAGNOSIS — M25562 Pain in left knee: Secondary | ICD-10-CM | POA: Insufficient documentation

## 2020-08-13 DIAGNOSIS — M25561 Pain in right knee: Secondary | ICD-10-CM

## 2020-08-13 DIAGNOSIS — M25461 Effusion, right knee: Secondary | ICD-10-CM | POA: Diagnosis not present

## 2020-08-13 DIAGNOSIS — M1712 Unilateral primary osteoarthritis, left knee: Secondary | ICD-10-CM | POA: Diagnosis not present

## 2020-08-13 DIAGNOSIS — M79606 Pain in leg, unspecified: Secondary | ICD-10-CM | POA: Insufficient documentation

## 2020-08-13 DIAGNOSIS — M1711 Unilateral primary osteoarthritis, right knee: Secondary | ICD-10-CM | POA: Diagnosis not present

## 2020-08-13 DIAGNOSIS — M25462 Effusion, left knee: Secondary | ICD-10-CM | POA: Diagnosis not present

## 2020-08-13 NOTE — Assessment & Plan Note (Addendum)
Patient with right calf pain for about 1 week.  She also has pain in her right quadriceps and hamstrings musculature suggestive of delayed onset muscle soreness.  I do have minor concern for a DVT.  The patient is not on any blood thinners but does take aspirin and Plavix after having a stent placed sometime ago.  She does have a negative Homans' sign on physical exam.  She also has an O2 sat of 98% today and is not tachycardic on physical exam.  She states this pain started after a day of walking.  Because I have low suspicion of this we will get a D-dimer today.  If it is positive I will order a Doppler ultrasound of her right calf.

## 2020-08-13 NOTE — Progress Notes (Signed)
SUBJECTIVE:   CHIEF COMPLAINT / HPI:   Knee pain/swelling as well as right calf swelling: Patient is a 55 year old female who presents today for knee pain and swelling which she states has been going on for about 1 week. Pain is in both legs and started Thursday evening. She did take some tylenol as well as heat and ice and voltaren gel none of which have helped her pain. She denies any change in activity level, she did have to do some additional walking before it happened but says this is not out of her norm.  Patient also endorses some right calf discomfort which started at the same time as her knee and quadriceps/hamstring pain.  Patient states she does take aspirin and Plavix for previous stent and has some concern for a blood clot with her lower leg though she has never had 1 of these before.  She does not take any blood thinners.  PERTINENT  PMH / PSH: Distant x-ray of the left knee showing some osteoarthritis  OBJECTIVE:   BP (!) 155/75   Pulse 74   Ht 5' 9.6" (1.768 m)   Wt (!) 329 lb 3.2 oz (149.3 kg)   LMP 07/16/2020 (Approximate)   SpO2 98%   BMI 47.78 kg/m    General: NAD, pleasant, able to participate in exam Respiratory: No respiratory distress MSK: Knee, left: Some discomfort in the joint line on both the lateral and medial aspect, negative valgus varus stress testing as well as anterior and posterior drawer.  No pain with palpation of the quadriceps tendon or patellar tendon.  Knee, right: Pain with palpation of the musculature of the upper thigh and both quadriceps and hamstrings suggestive of delayed onset muscle soreness, patient also has pain with palpation of the medial and lateral joint line which is worse than on the left.  Negative varus and valgus stress testing, negative anterior posterior drawer testing, negative pain with palpation of the quadriceps tendon or patellar tendon.  Right calf: Patient does have pain with palpation of the musculature of the right  calf.  Negative Homans' sign. Psych: Normal affect and mood  ASSESSMENT/PLAN:   Right calf pain Patient with right calf pain for about 1 week.  She also has pain in her right quadriceps and hamstrings musculature suggestive of delayed onset muscle soreness.  I do have minor concern for a DVT.  The patient is not on any blood thinners but does take aspirin and Plavix after having a stent placed sometime ago.  She does have a negative Homans' sign on physical exam.  She also has an O2 sat of 98% today and is not tachycardic on physical exam.  She states this pain started after a day of walking.  Because I have low suspicion of this we will get a D-dimer today.  If it is positive I will order a Doppler ultrasound of her right calf.   Bilateral knee pain Assessment: 55 year old female with bilateral knee pain for for about 1 week.  She does have a previous history of some arthritis in the left knee with x-rays performed about 4 years ago. She denies any falls or other trauma.  She does state that there is possibility she did a bit more ambulation the day prior to this occurring which she was going to use as a judgment whether she could start an exercise program or not. On physical exam she does have tenderness to both joint lines which is suggestive of osteoarthritis but she  also has significant tenderness to the musculature of the right quadriceps and hamstrings region suggestive of delayed onset muscle soreness.  Most likely differential is delayed onset muscle soreness for the right quadriceps and hamstrings musculature with some aggravation of osteoarthritis, however she is not had x-rays in some time. Plan: -We will get x-rays of bilateral knees today. -Based off the results of the x-rays will discuss next steps.    Lurline Del, [place]    This note was prepared using Dragon voice recognition software and may include unintentional dictation errors due to the  inherent limitations of voice recognition software.

## 2020-08-13 NOTE — Patient Instructions (Signed)
It was great to see you! Thank you for allowing me to participate in your care!  I recommend that you always bring your medications to each appointment as this makes it easy to ensure we are on the correct medications and helps [place] not miss when refills are needed.  Our plans for today:  -I have ordered x-rays of both knees.  You can go across the street to [facility] and should be able to walk in to get these x-rays done.  You can do this at your convenience as early as this afternoon. -I want to do a blood test to check for any concern of a blood clot.  If it is positive I will get you in for a Doppler ultrasound of the right leg, if it is negative then I would have a low suspicion for any blood clot. -I should get the results back from both of these test within 1 to 2 days after you have them completed and will give you a call with these results or send you a MyChart message to discuss next steps.  Take care and seek immediate care sooner if you develop any concerns.   Dr. Lurline Del, [place]

## 2020-08-13 NOTE — Assessment & Plan Note (Signed)
Assessment: 55 year old female with bilateral knee pain for for about 1 week.  She does have a previous history of some arthritis in the left knee with x-rays performed about 4 years ago. She denies any falls or other trauma.  She does state that there is possibility she did a bit more ambulation the day prior to this occurring which she was going to use as a judgment whether she could start an exercise program or not. On physical exam she does have tenderness to both joint lines which is suggestive of osteoarthritis but she also has significant tenderness to the musculature of the right quadriceps and hamstrings region suggestive of delayed onset muscle soreness.  Most likely differential is delayed onset muscle soreness for the right quadriceps and hamstrings musculature with some aggravation of osteoarthritis, however she is not had x-rays in some time. Plan: -We will get x-rays of bilateral knees today. -Based off the results of the x-rays will discuss next steps.

## 2020-08-14 ENCOUNTER — Other Ambulatory Visit: Payer: Self-pay | Admitting: Family Medicine

## 2020-08-14 ENCOUNTER — Inpatient Hospital Stay: Payer: Self-pay

## 2020-08-14 ENCOUNTER — Telehealth: Payer: Self-pay

## 2020-08-14 DIAGNOSIS — M79661 Pain in right lower leg: Secondary | ICD-10-CM

## 2020-08-14 LAB — D-DIMER, QUANTITATIVE: D-DIMER: 0.61 mg/L FEU — ABNORMAL HIGH (ref 0.00–0.49)

## 2020-08-14 NOTE — Telephone Encounter (Signed)
Spoke with pt. Informed of [place] at [facility] and VAS tomorrow 3/23 at 4:00. Pt understood. Salvatore Marvel, CMA

## 2020-08-14 NOTE — Telephone Encounter (Signed)
-----   Message from Lurline Del, DO sent at 08/14/2020  3:55 PM EDT ----- Can you call this patient and schedule her dvt ultrasound? Her d-dimer was elevated and she knows to expect a call to schedule the ultrasound, it has been ordered.  Thank you,  Thurmond Butts

## 2020-08-16 ENCOUNTER — Ambulatory Visit (HOSPITAL_COMMUNITY)
Admission: RE | Admit: 2020-08-16 | Discharge: 2020-08-16 | Disposition: A | Discharge status: Home / Self Care | Payer: BC Managed Care – PPO | Source: Ambulatory Visit | Attending: Family Medicine | Admitting: Family Medicine

## 2020-08-16 ENCOUNTER — Other Ambulatory Visit: Payer: Self-pay

## 2020-08-16 DIAGNOSIS — M79661 Pain in right lower leg: Secondary | ICD-10-CM | POA: Diagnosis not present

## 2020-08-16 NOTE — Progress Notes (Signed)
Right lower extremity venous study completed.    Attempted to call, no answer just kept ringing.     Please see CV Proc for preliminary results.   Vonzell Schlatter, RVT

## 2020-08-17 ENCOUNTER — Other Ambulatory Visit: Payer: Self-pay

## 2020-08-17 DIAGNOSIS — N3946 Mixed incontinence: Secondary | ICD-10-CM

## 2020-08-17 DIAGNOSIS — M5442 Lumbago with sciatica, left side: Secondary | ICD-10-CM

## 2020-08-17 DIAGNOSIS — G8929 Other chronic pain: Secondary | ICD-10-CM

## 2020-08-17 MED ORDER — CYCLOBENZAPRINE HCL 10 MG PO TABS
10.0000 mg | ORAL_TABLET | Freq: Three times a day (TID) | ORAL | 0 refills | Status: DC | PRN
Start: 2020-08-17 — End: 2021-05-21

## 2020-08-17 MED ORDER — OXYBUTYNIN CHLORIDE 5 MG PO TABS: 5.0000 mg | ORAL_TABLET | Freq: Two times a day (BID) | ORAL | 2 refills | Status: DC

## 2020-08-20 ENCOUNTER — Encounter: Payer: Self-pay | Admitting: Family Medicine

## 2020-08-20 ENCOUNTER — Other Ambulatory Visit: Payer: Self-pay

## 2020-08-20 ENCOUNTER — Ambulatory Visit (HOSPITAL_COMMUNITY)
Admission: RE | Admit: 2020-08-20 | Discharge: 2020-08-20 | Disposition: A | Discharge status: Home / Self Care | Payer: BC Managed Care – PPO | Source: Ambulatory Visit | Attending: Family Medicine | Admitting: Family Medicine

## 2020-08-20 ENCOUNTER — Ambulatory Visit (INDEPENDENT_AMBULATORY_CARE_PROVIDER_SITE_OTHER): Payer: BC Managed Care – PPO | Admitting: Family Medicine

## 2020-08-20 VITALS — BP 160/80 | HR 62 | Ht 69.0 in | Wt 321.2 lb

## 2020-08-20 DIAGNOSIS — R0609 Other forms of dyspnea: Secondary | ICD-10-CM

## 2020-08-20 DIAGNOSIS — I150 Renovascular hypertension: Secondary | ICD-10-CM

## 2020-08-20 DIAGNOSIS — R0600 Dyspnea, unspecified: Secondary | ICD-10-CM

## 2020-08-20 DIAGNOSIS — M79604 Pain in right leg: Secondary | ICD-10-CM

## 2020-08-20 DIAGNOSIS — M79605 Pain in left leg: Secondary | ICD-10-CM

## 2020-08-20 NOTE — Patient Instructions (Addendum)
It was great seeing you today!  Please check-out at the front desk before leaving the clinic. Follow up tomorrow at 1:45 PM here at the clinic. They will discuss your lab work tomorrow.   Visit Remembers: - Take an extra dose of amlodipine and Lasix take 60 mg for your evening dose  - Go by the [facility] to get your chest x-ray after your check out.   Please bring all of your medications with you to each visit.    If you haven't already, sign up for My Chart to have easy access to your labs results, and communication with your primary care physician.  Feel free to call with any questions or concerns at any time, at [phone].   Take care,  Dr. Rushie [facility]

## 2020-08-20 NOTE — Progress Notes (Signed)
SUBJECTIVE:   CHIEF COMPLAINT / HPI:   Chief Complaint  Patient presents with  . Follow-up     Heidi Rivera is a 55 y.o. female here for RLE pain follow up. Using Voltaren gel and using it 4 times a day.  Tylenol 650 mg four times a day.  This is not helping her pain. Has increased pain in left lower extremity as well. No new swelling. She is having difficulty moving her legs due to pain.   Pt reports being short of breath for about 3 days. 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 She has some chest discomfort. Had coughing and "mucus" in her chest that she "couldn't move." She is not able to sleep flat. She feels winding like she has been jogging. Denies hx of smoking. Denies fevers, nasal congestion, sore throat, smell or taste changes. She is not COVID vaccinated.   She reports BP 150-180 / 80-100 at home. She is compliant with her blood pressure medications (amlodipine and carvedilol). She stopped Losartan after her renal artery recanalization.     PERTINENT  PMH / PSH: reviewed and updated as appropriate   OBJECTIVE:   BP (!) 200/125   Pulse 62   Ht '5\' 9"'$  (1.753 m)   Wt (!) 321 lb 3.2 oz (145.7 kg)   LMP 08/15/2020   SpO2 99%   BMI 47.43 kg/m    GEN: well appearing female, in no acute distress  CV: regular rate and rhythm, no murmurs appreciated  RESP: no increased work of breathing, clear to ascultation bilaterally with no crackles, wheezes, or rhonchi though difficult to auscultate due to body habitus, wearing N95  MSK:  Trace bilateral ankle edema. Bilateral calf tenderness. Decrease lower extremity ROM due to pain SKIN: warm, dry, no lower extremity rash or overlying skin changes     ASSESSMENT/PLAN:   Hypertension Uncontrolled 200/125 today. Manual recheck after 20 minutes 160/80.  Pt is s/p left renal artery stent in Dec 2021. Pain possibly confounding elevated BP. Taking amlodipine 5 mg, Lasix 40 mg BID,  Coreg 6.25 mg BID. She has telimesartan 80 mg daily listed as an outside medication from 08/15/20. Pt did not mention taking this medication. Asked her to bring medications to her follow up appt tomorrow. Advised pt to take an additional 20 mg Lasix with her evening Lasix dose and additional 5 mg amlodipine tablet.  - Obtain CMP  DOE (dyspnea on exertion) Has had 3 days of DOE. She does not smoke. She is not COVID vaccinated. She has cough and congestion but no fevers. COVID test and CXR ordered. Consider bronchitis, pneumonia, viral infection. She does have orthopnea but appears euvolemic on exam. ECHO reviewed from Nov 2021 with EF 65-70%, moderate concentric LVH and G1DD. BNP ordered. CBC ordered to assess for anemia. Of note, she did have mildly elevated D-dimer without evidence of DVT last week. Can not completely exclude PE. She is satting well on room air, is not tachycardic,  and no unilateral LE edema on exam. No hx of DVT or PE. EKG shows sinus bradycardia without acute ST or T wave changes. Pt to follow up in clinic tomorrow.   Lower extremity pain Continues to LE pain now bilateral. She has bilateral calf tenderness on exam. DVT study negative last week. Possibly COVID related myalgias. COVID test pending. Possibly rhabdomyolysis given statin use. Obtain CK.  Tylenol 1000 mg TID PRN and topical voltargen gel QID  PRN for pain.      [name], DO PGY-2, [facility] 08/20/2020

## 2020-08-21 ENCOUNTER — Ambulatory Visit (INDEPENDENT_AMBULATORY_CARE_PROVIDER_SITE_OTHER): Payer: BC Managed Care – PPO | Admitting: Family Medicine

## 2020-08-21 ENCOUNTER — Other Ambulatory Visit: Payer: Self-pay

## 2020-08-21 ENCOUNTER — Encounter: Payer: Self-pay | Admitting: Family Medicine

## 2020-08-21 DIAGNOSIS — I150 Renovascular hypertension: Secondary | ICD-10-CM

## 2020-08-21 DIAGNOSIS — Z789 Other specified health status: Secondary | ICD-10-CM

## 2020-08-21 DIAGNOSIS — R0609 Other forms of dyspnea: Secondary | ICD-10-CM

## 2020-08-21 DIAGNOSIS — R0600 Dyspnea, unspecified: Secondary | ICD-10-CM

## 2020-08-21 LAB — COMPREHENSIVE METABOLIC PANEL
ALT: 24 IU/L (ref 0–32)
AST: 23 IU/L (ref 0–40)
Albumin/Globulin Ratio: 1.6 (ref 1.2–2.2)
Albumin: 4.1 g/dL (ref 3.8–4.9)
Alkaline Phosphatase: 78 IU/L (ref 44–121)
BUN/Creatinine Ratio: 8 — ABNORMAL LOW (ref 9–23)
BUN: 18 mg/dL (ref 6–24)
Bilirubin Total: 0.3 mg/dL (ref 0.0–1.2)
CO2: 24 mmol/L (ref 20–29)
Calcium: 9.4 mg/dL (ref 8.7–10.2)
Chloride: 102 mmol/L (ref 96–106)
Creatinine, Ser: 2.22 mg/dL — ABNORMAL HIGH (ref 0.57–1.00)
Globulin, Total: 2.6 g/dL (ref 1.5–4.5)
Glucose: 117 mg/dL — ABNORMAL HIGH (ref 65–99)
Potassium: 3.7 mmol/L (ref 3.5–5.2)
Sodium: 141 mmol/L (ref 134–144)
Total Protein: 6.7 g/dL (ref 6.0–8.5)
eGFR: 26 mL/min/{1.73_m2} — ABNORMAL LOW (ref >59)

## 2020-08-21 LAB — CBC
Hematocrit: 42.1 % (ref 34.0–46.6)
Hemoglobin: 13.7 g/dL (ref 11.1–15.9)
MCH: 27.6 pg (ref 26.6–33.0)
MCHC: 32.5 g/dL (ref 31.5–35.7)
MCV: 85 fL (ref 79–97)
Platelets: 261 10*3/uL (ref 150–450)
RBC: 4.96 x10E6/uL (ref 3.77–5.28)
RDW: 13.6 % (ref 11.7–15.4)
WBC: 6.7 10*3/uL (ref 3.4–10.8)

## 2020-08-21 LAB — CK: Total CK: 852 U/L (ref 32–182)

## 2020-08-21 LAB — BRAIN NATRIURETIC PEPTIDE: BNP: 24 pg/mL (ref 0.0–100.0)

## 2020-08-21 LAB — NOVEL CORONAVIRUS, NAA: SARS-CoV-2, NAA: NOT DETECTED

## 2020-08-21 LAB — SARS-COV-2, NAA 2 DAY TAT

## 2020-08-21 MED ORDER — PROAIR RESPICLICK 108 (90 BASE) MCG/ACT IN AEPB: 2.0000 | INHALATION_SPRAY | RESPIRATORY_TRACT | 1 refills | Status: DC | PRN

## 2020-08-21 NOTE — Assessment & Plan Note (Addendum)
Has had 3 days of DOE. She does not smoke. She is not COVID vaccinated. She has cough and congestion but no fevers. COVID test and CXR ordered. Consider bronchitis, pneumonia, viral infection. She does have orthopnea but appears euvolemic on exam. ECHO reviewed from Nov 2021 with EF 65-70%, moderate concentric LVH and G1DD. BNP ordered. CBC ordered to assess for anemia. Of note, she did have mildly elevated D-dimer without evidence of DVT last week. Can not completely exclude PE. She is satting well on room air, is not tachycardic,  and no unilateral LE edema on exam. No hx of DVT or PE. EKG shows sinus bradycardia without acute ST or T wave changes. Pt to follow up in clinic tomorrow.

## 2020-08-21 NOTE — Progress Notes (Signed)
SUBJECTIVE:   CHIEF COMPLAINT / HPI:   BLE pain, SOB, chest pain, positionally dependent cough Seen 3/21 by Dr. Vanessa Stockdale - c/o right leg pain, right calf swelling, also bilateral knee pain - currently on asa and plavix for left renal stent on 12/02 - D-dimer 3/21 slightly elevated at 0.61 - RLE [place] 3/24 negative for DVT   Subsequently seen by Dr. Susa Simmonds 3/28 - c/o continued RLE pain (despite tylenol and voltaren) and SOB x 3 days - some chest discomfort with cough and stubborn "mucous in chest" - not able to sleep flat - COVID and BNP pending - CBC unremarkable - CK mildly elevated at 852 - CMP demonstrates Cr of 2.22, slightly above baseline of 2.0-2.10 and glucose of 117, otherwise within normal limits - Patient advised to go to ED but declined, was given strict ED precautions - Dr. Susa Simmonds asked patient to hold statin and f/u in clinic today  Today's visit: - reports SOB constant at baseline but worse with activity - can't lay flat at bedtime due to cough only while flat, feels like she needs to cough up mucous but can't - CP better today, says it feels like last year when she was diagnosed with costochondritis - ankles "hurt like they're going to crack" - knees aching - shooting pain down right leg, affecting driving bc difficult to switch from gas to brake pedal (similar to known left sided sciatica) - can feel fluid loss from increased lasix, feels like her clothes fit better - history of mostly normal ECHO 04/03/20 with mild G1DD - previously referred for sleep study but hasn't done yet  PERTINENT  PMH / PSH: Renal artery stenosis s/p left renal stent 12/02 with Dr. Gwenlyn Found, CKD stage 3, HTN, chronic lumbar pain with left-sided sciatica, obesity  OBJECTIVE:   BP (!) 178/98   Pulse (!) 58   Wt (!) 319 lb (144.7 kg)   LMP 08/15/2020   SpO2 97%   BMI 47.11 kg/m    PHQ-9:  Depression screen [facility] 2/9 08/21/2020 08/20/2020 07/23/2020  Decreased Interest 0 0 0  Down,  Depressed, Hopeless 0 0 0  PHQ - 2 Score 0 0 0  Altered sleeping 1 1 0  Tired, decreased energy 1 1 0  Change in appetite 0 0 0  Feeling bad or failure about yourself  0 0 0  Trouble concentrating 0 0 0  Moving slowly or fidgety/restless 0 0 0  Suicidal thoughts 0 0 0  PHQ-9 Score 2 2 0  Difficult doing work/chores - Very difficult -     GAD-7: No flowsheet data found.   Physical Exam General: Awake, alert, oriented Cardiovascular: Regular rhythm, brachycardic to 50s, S1 and S2 present, no murmurs auscultated Respiratory: Lung fields clear to auscultation bilaterally Extremities: Trace BLE edema, palpable pedal and pretibial pulses bilaterally, bilat ankle/calf/knee TTP  ASSESSMENT/PLAN:   Statin intolerance Leg and ankle pain, SOB, msk chest pain, persistent cough (while lying down) with elevated CK of 852, most likely statin intolerance. Less likely PE as she is on asa and plavix for renal stent with Wells Score of 3 (only + RLE pain/swelling) and mild D-dimer of 0.61. Unlikely acute HF given BNP normal at 24 with only trace BLE edema and largely normal ECHO 04/03/20 (G1DD). COVID swab still pending.  - Stop rosuvastatin - Follow up one week - If symptoms have subsided, consider re-challenge with statin to confirm true statin intolerance - Given need for secondary prevention, consider starting alternative  lipid lowering medications asap  Hypertension BP continues to be elevated today, 178/98. Pain likely confounding. Per patient, not on telmesartan as questioned by Dr. Susa Simmonds 08/21/20.  - Continue increased lasix: 60 mg in AM, 40 mg in PM - Continue increased amlodipine of 10 mg daily - Follow up in one week  DOE (dyspnea on exertion) Patient requested refill of albuterol. Previously ordered albuterol no longer covered by current insurance. Switched to [employer] as this is on her insurance formulary. Instructed her to ask the pharmacist for teaching on this different  albuterol administration device. If doesn't work well for her, can re-prescribe old inhaler.     [name], MD [place]

## 2020-08-21 NOTE — Assessment & Plan Note (Addendum)
Uncontrolled 200/125 today. Manual recheck after 20 minutes 160/80.  Pt is s/p left renal artery stent in Dec 2021. Pain possibly confounding elevated BP. Taking amlodipine 5 mg, Lasix 40 mg BID, Coreg 6.25 mg BID. She has telimesartan 80 mg daily listed as an outside medication from 08/15/20. Pt did not mention taking this medication. Asked her to bring medications to her follow up appt tomorrow. Advised pt to take an additional 20 mg Lasix with her evening Lasix dose and additional 5 mg amlodipine tablet.  - Obtain CMP

## 2020-08-21 NOTE — Patient Instructions (Signed)
It was wonderful to meet you today. Thank you for allowing me to be a part of your care. Below is a short summary of what we discussed at your visit today:  Leg and ankle pain Given your pain and elevated CK (creatinie kinase - that marker of muscle stress) I believe you are having a reaction to your statin medication (the cholesterol medication).  - stop taking your rosuvastatin  - come back in one week  High blood pressure - Keep taking the extra amlodipine for a total of 10 mg daily - Take the extra lasix once a day:  -- Normal lasix dose ('40mg'$ ) in the morning   -- Extra lasix dose (60 mg) in the evening  -- You may switch and do 60 mg in the morning, 40 mg in the evening (whatever works best for you) - Come back in one week for a recheck, sooner if needed  Med refills - As requested, I have sent a refill of your albuterol medication to your pharmacy.   Please bring all of your medications to every appointment!  If you have any questions or concerns, please do not hesitate to contact us via phone or MyChart message.   [name], MD

## 2020-08-21 NOTE — Assessment & Plan Note (Addendum)
Continues to LE pain now bilateral. She has bilateral calf tenderness on exam. DVT study negative last week. Possibly COVID related myalgias. COVID test pending. Possibly rhabdomyolysis given statin use. Obtain CK.  Tylenol 1000 mg TID PRN and topical voltargen gel QID PRN for pain.

## 2020-08-22 DIAGNOSIS — Z789 Other specified health status: Secondary | ICD-10-CM | POA: Insufficient documentation

## 2020-08-22 NOTE — Assessment & Plan Note (Addendum)
Leg and ankle pain, SOB, msk chest pain, persistent cough (while lying down) with elevated CK of 852, most likely statin intolerance. Less likely PE as she is on asa and plavix for renal stent with Wells Score of 3 (only + RLE pain/swelling) and mild D-dimer of 0.61. Unlikely acute HF given BNP normal at 24 with only trace BLE edema and largely normal ECHO 04/03/20 (G1DD). COVID swab still pending.  - Stop rosuvastatin - Follow up one week - If symptoms have subsided, consider re-challenge with statin to confirm true statin intolerance - Given need for secondary prevention, consider starting alternative lipid lowering medications asap

## 2020-08-22 NOTE — Assessment & Plan Note (Signed)
Patient requested refill of albuterol. Previously ordered albuterol no longer covered by current insurance. Switched to [employer] as this is on her insurance formulary. Instructed her to ask the pharmacist for teaching on this different albuterol administration device. If doesn't work well for her, can re-prescribe old inhaler.

## 2020-08-22 NOTE — Assessment & Plan Note (Signed)
BP continues to be elevated today, 178/98. Pain likely confounding. Per patient, not on telmesartan as questioned by Dr. Susa Simmonds 08/21/20.  - Continue increased lasix: 60 mg in AM, 40 mg in PM - Continue increased amlodipine of 10 mg daily - Follow up in one week

## 2020-08-23 ENCOUNTER — Other Ambulatory Visit: Payer: Self-pay

## 2020-08-23 DIAGNOSIS — R0600 Dyspnea, unspecified: Secondary | ICD-10-CM

## 2020-08-23 DIAGNOSIS — R0609 Other forms of dyspnea: Secondary | ICD-10-CM

## 2020-08-23 NOTE — Patient Instructions (Signed)
It was great to see you! Thank you for allowing me to participate in your care!  I recommend that you always bring your medications to each appointment as this makes it easy to ensure we are on the correct medications and helps [place] not miss when refills are needed.  Our plans for today:  -I do think it would be a good idea to get back in with your kidney doctor -I want to start another medication, restarting your losartan but we need to check some labs first.  We will check some labs today and I will call you tomorrow or the next day when they return and potentially start that medication if appropriate. -We will also check your potassium level on those labs -I also want to screen for diabetes as this could be the cause of the tingling sensation in your feet -If we start the losartan medication I would like for you to make a follow-up appointment about 1 week after we start that -We have also started a different cholesterol medicine called pravastatin.  I have sent this to your pharmacy.  We are checking some labs today, I will call you if they are abnormal will send you a MyChart message or a letter if they are normal.  If you do not hear about your labs in the next 2 weeks please let us know.  Take care and seek immediate care sooner if you develop any concerns.   Dr. Lurline Del, [place]

## 2020-08-23 NOTE — Progress Notes (Signed)
SUBJECTIVE:   CHIEF COMPLAINT / HPI:   Follow-up-concern for Statin intolerance: Patient is a 55 year old female presenting today for follow-up after being evaluated for possible statin intolerance on 08/21/2020.  At that time she was complaining of MSK pain involving her leg and ankle as well as some musculoskeletal chest pain and an elevated CK of 852.  The plan from that appointment was to discontinue the rosuvastatin at that time and follow-up in 1 week to see if her symptoms had subsided, if they had consider rechallenge with a statin to confirm a true statin intolerance and consider alternative lipid-lowering medications for secondary prevention.  ASCVD 10-year risk of 13.5%. She states she continues to have leg pains and cramps similar to before stopping the statin.  She still has some complaints of tingling and pain in her lower extremities.  She states that she has not been checked for diabetes in a long time and previously only was prediabetic.  Hypertension: Patient was also noted to have an elevated blood pressure of 178/98 with pain likely be a confounding factor in that.  Patient was continued on her Lasix dose of 60 mg in the morning, 40 at night and amlodipine of 10 mg daily with recommendation to follow-up today. She is also on coreg of 6.'25mg'$  BID.  Today her blood pressure is 150/90. She is seeing numbers in the 150-160s and up to 180s. She has made dietary changes and reduced her sodium. Her losartan was stopped due to AKI after a stent in her renal artery.   PERTINENT  PMH / PSH: Recent stent in her renal artery  OBJECTIVE:   BP (!) 150/90   Pulse 63   Ht '5\' 9"'$  (1.753 m)   Wt (!) 317 lb 2 oz (143.8 kg)   LMP 08/15/2020   SpO2 99%   BMI 46.83 kg/m    General: NAD, pleasant, able to participate in exam Cardiac: RRR, no murmurs. Respiratory: CTAB, normal effort MSK: No peripheral edema in her lower extremities, some mild discomfort with palpation of her lower  extremities. Psych: Normal affect and mood  ASSESSMENT/PLAN:   Hypertension Blood pressure elevated in office today of 150/90.  She has had blood pressures similar to this while checking it at home in the 0000000 systolic.  She continues on Coreg, Lasix, and amlodipine.  She previously stopped her losartan after her renal artery stenosis having a bump in her creatinine. Plan: -We will check creatinine today, assuming it is appropriate will restart her losartan over the phone.  She expects a call from me in the next 1 to 2 days -I recommended that she do reach out to her nephrologist to see if they have any further recommendations -She will follow up 1 week after we restart the losartan assuming that we do  Statin intolerance Assessment: 55 year old female with a bump in CK and myalgias while on a statin.  Her statin has been discontinued but she states that she still continues to have myalgias at the similar level to when she was on her statin.  She understands that she has an ASCVD risk of 13.5% and she does want to go back on a statin.  Discussed patient's case with Dr. Nori Riis who recommended a trial of pravastatin to see if patient has similar symptoms.  I have sent this to her pharmacy.  She will let us know if she has any further issues while on it.  We will also check a BMP today to look for  any signs of electrolyte abnormalities as well as an A1c to screen for type 2 diabetes as she endorses some numbness and tingling in her lower extremities and has not had one in some time.   Lurline Del, [place]    This note was prepared using Dragon voice recognition software and may include unintentional dictation errors due to the inherent limitations of voice recognition software.

## 2020-08-24 ENCOUNTER — Telehealth: Payer: Self-pay

## 2020-08-24 ENCOUNTER — Other Ambulatory Visit: Payer: Self-pay | Admitting: Family Medicine

## 2020-08-24 MED ORDER — ALBUTEROL SULFATE HFA 108 (90 BASE) MCG/ACT IN AERS: 2.0000 | INHALATION_SPRAY | Freq: Four times a day (QID) | RESPIRATORY_TRACT | 1 refills | Status: DC | PRN

## 2020-08-24 NOTE — Progress Notes (Signed)
Ventolin HFA sent to pharmacy.

## 2020-08-27 ENCOUNTER — Other Ambulatory Visit: Payer: Self-pay

## 2020-08-28 ENCOUNTER — Encounter: Payer: Self-pay | Admitting: Family Medicine

## 2020-08-28 ENCOUNTER — Other Ambulatory Visit: Payer: Self-pay

## 2020-08-28 ENCOUNTER — Ambulatory Visit (INDEPENDENT_AMBULATORY_CARE_PROVIDER_SITE_OTHER): Payer: BC Managed Care – PPO | Admitting: Family Medicine

## 2020-08-28 VITALS — BP 150/90 | HR 63 | Ht 69.0 in | Wt 317.1 lb

## 2020-08-28 DIAGNOSIS — Z789 Other specified health status: Secondary | ICD-10-CM | POA: Diagnosis not present

## 2020-08-28 DIAGNOSIS — I150 Renovascular hypertension: Secondary | ICD-10-CM

## 2020-08-28 DIAGNOSIS — Z131 Encounter for screening for diabetes mellitus: Secondary | ICD-10-CM

## 2020-08-28 MED ORDER — PRAVASTATIN SODIUM 40 MG PO TABS: 40.0000 mg | ORAL_TABLET | Freq: Every day | ORAL | 3 refills | Status: DC

## 2020-08-28 NOTE — Assessment & Plan Note (Signed)
Blood pressure elevated in office today of 150/90.  She has had blood pressures similar to this while checking it at home in the 0000000 systolic.  She continues on Coreg, Lasix, and amlodipine.  She previously stopped her losartan after her renal artery stenosis having a bump in her creatinine. Plan: -We will check creatinine today, assuming it is appropriate will restart her losartan over the phone.  She expects a call from me in the next 1 to 2 days -I recommended that she do reach out to her nephrologist to see if they have any further recommendations -She will follow up 1 week after we restart the losartan assuming that we do

## 2020-08-28 NOTE — Assessment & Plan Note (Signed)
Assessment: 55 year old female with a bump in CK and myalgias while on a statin.  Her statin has been discontinued but she states that she still continues to have myalgias at the similar level to when she was on her statin.  She understands that she has an ASCVD risk of 13.5% and she does want to go back on a statin.  Discussed patient's case with Dr. Nori Riis who recommended a trial of pravastatin to see if patient has similar symptoms.  I have sent this to her pharmacy.  She will let us know if she has any further issues while on it.  We will also check a BMP today to look for any signs of electrolyte abnormalities as well as an A1c to screen for type 2 diabetes as she endorses some numbness and tingling in her lower extremities and has not had one in some time.

## 2020-08-29 ENCOUNTER — Other Ambulatory Visit: Payer: Self-pay | Admitting: Family Medicine

## 2020-08-29 DIAGNOSIS — I150 Renovascular hypertension: Secondary | ICD-10-CM

## 2020-08-29 LAB — BASIC METABOLIC PANEL WITH GFR
BUN/Creatinine Ratio: 11 (ref 9–23)
BUN: 21 mg/dL (ref 6–24)
CO2: 24 mmol/L (ref 20–29)
Calcium: 9.7 mg/dL (ref 8.7–10.2)
Chloride: 101 mmol/L (ref 96–106)
Creatinine, Ser: 1.9 mg/dL — ABNORMAL HIGH (ref 0.57–1.00)
Glucose: 72 mg/dL (ref 65–99)
Potassium: 4.2 mmol/L (ref 3.5–5.2)
Sodium: 141 mmol/L (ref 134–144)
eGFR: 31 mL/min/1.73 — ABNORMAL LOW (ref >59)

## 2020-08-29 LAB — HEMOGLOBIN A1C
Est. average glucose Bld gHb Est-mCnc: 146 mg/dL
Hgb A1c MFr Bld: 6.7 % — ABNORMAL HIGH (ref 4.8–5.6)

## 2020-08-29 MED ORDER — IRBESARTAN 150 MG PO TABS: 150.0000 mg | ORAL_TABLET | Freq: Every day | ORAL | 1 refills | Status: DC

## 2020-08-29 MED ORDER — PRAVASTATIN SODIUM 40 MG PO TABS: 40.0000 mg | ORAL_TABLET | Freq: Every day | ORAL | 3 refills | Status: DC

## 2020-08-29 NOTE — Progress Notes (Signed)
Called patient to discuss her lab results.  I checked a BMP because the patient had an AKI on last check and we were discussing restarting her losartan medication.  Patient's BMP showed creatinine of 1.9 which seems to be at or slightly below her recent baselines.  I called patient and discussed that I would like to restart an ARB.  She was previously on losartan however her insurance does not seem to have this is a preferred medication at this time.  We will start with irbesartan.  Patient plans to make a follow-up appointment in 1 week to come in and get labs.  I have ordered a future BMP for this.  I also discussed with the patient that she had an A1c that was elevated at 6.7.  This does put her in the range for type 2 diabetes.  I discussed with the patient that because this is close to the threshold for that range I would like to recheck in 3 months to confirm.  I invited the patient to schedule a future appointment with me to discuss diet and exercise basics to help her get started on ways to improve her health in general.

## 2020-09-10 NOTE — Telephone Encounter (Signed)
Routed to PCP. Burlin Mcnair, CMA  

## 2020-09-11 ENCOUNTER — Ambulatory Visit (INDEPENDENT_AMBULATORY_CARE_PROVIDER_SITE_OTHER): Payer: BC Managed Care – PPO | Admitting: Family Medicine

## 2020-09-11 ENCOUNTER — Encounter: Payer: Self-pay | Admitting: Family Medicine

## 2020-09-11 ENCOUNTER — Other Ambulatory Visit: Payer: Self-pay

## 2020-09-11 VITALS — BP 160/90 | HR 56 | Ht 69.0 in | Wt 322.2 lb

## 2020-09-11 DIAGNOSIS — G43001 Migraine without aura, not intractable, with status migrainosus: Secondary | ICD-10-CM

## 2020-09-11 DIAGNOSIS — M5442 Lumbago with sciatica, left side: Secondary | ICD-10-CM | POA: Diagnosis not present

## 2020-09-11 DIAGNOSIS — I150 Renovascular hypertension: Secondary | ICD-10-CM

## 2020-09-11 DIAGNOSIS — G8929 Other chronic pain: Secondary | ICD-10-CM

## 2020-09-11 MED ORDER — ACETAMINOPHEN 500 MG PO TABS: 1000.0000 mg | ORAL_TABLET | Freq: Three times a day (TID) | ORAL | 0 refills | Status: AC | PRN

## 2020-09-11 MED ORDER — PROCHLORPERAZINE MALEATE 10 MG PO TABS: 10.0000 mg | ORAL_TABLET | Freq: Four times a day (QID) | ORAL | 0 refills | Status: DC | PRN

## 2020-09-11 MED ORDER — FUROSEMIDE 40 MG PO TABS: 60.0000 mg | ORAL_TABLET | Freq: Every day | ORAL | 2 refills | Status: DC

## 2020-09-11 MED ORDER — DIPHENHYDRAMINE HCL 25 MG PO CAPS: 25.0000 mg | ORAL_CAPSULE | Freq: Four times a day (QID) | ORAL | 0 refills | Status: DC | PRN

## 2020-09-11 NOTE — Progress Notes (Signed)
   SUBJECTIVE:   CHIEF COMPLAINT / HPI:   Chief Complaint  Patient presents with  . Follow-up    Labs and BP     Shanitra Teller is a 55 y.o. female here for migraine headache.  Pt reports headache since Sunday.  History of migraines and is similar to that however sumatriptan is not helping.  Last took sumatriptan this morning.  She has missed work due to her headache.  Describes light sensitivity, eye pressure, persistent nausea, neck pain and sleep disturbance.  She also took meclizine to help with her nausea but that did not help.  She follows with neurology but missed her January follow-up.  On Aimovig but is delaying her next shot due to concerns for hypertension.   PERTINENT  PMH / PSH: reviewed and updated as appropriate   OBJECTIVE:   BP (!) 160/90   Pulse (!) 56   Ht '5\' 9"'$  (1.753 m)   Wt (!) 322 lb 4 oz (146.2 kg)   LMP 08/15/2020   SpO2 98%   BMI 47.59 kg/m    GEN: Uncomfortable appearing female, in no acute distress  EYES: PERRL, ocular movements intact, wearing sunglasses NEURO: Cranial nerves II through XII grossly intact, no speech, no facial asymmetry, no abnormal coordination CVS: well perfused RESP: speaking in full sentences without pause, no respiratory distress  SKIN: Warm and dry    ASSESSMENT/PLAN:   Hypertension Uncontrolled.  BP 160/90.  Elevated BP likely multifactorial given her concurrent migraine as well as reporting being out Lasix.  She is taking 60 mg twice daily.  Refills sent to pharmacy.  Weight is up from baseline.  Continue Coreg, amlodipine.  Blood pressure is also being managed by nephrology and cardiology.  Defer significant medication changes to specialists given her history of renal artery stenosis. Repeat BMP today.  Migraine without aura Acute exacerbation.   Neurology started her on on Aimovig.  She is due for this shot but is apprehensive because it also causes hypertension.  Missed her neurology appointment in January.   Advised patient to reschedule neurology appointment.  No head imaging for review.  For acute headache, NSAIDs avoided given history of renal artery stenosis, take 1000 mg Tylenol 4 times daily as needed, Compazine 10 mg 3 times daily as needed with 25 mg Benadryl.  ED precautions given, patient and granddaughter voiced understanding.     [name], DO PGY-2, [facility] 09/13/2020

## 2020-09-11 NOTE — Patient Instructions (Signed)
It was great seeing you today! For your migraine: Take 1000 mg three times a day as needed, take 25 mg Benadryl with Compazine (anti-nausea medication).   If your headache does not improve, go to the ED.   Take care,   [place]

## 2020-09-12 ENCOUNTER — Other Ambulatory Visit: Payer: Self-pay | Admitting: Neurology

## 2020-09-12 LAB — BASIC METABOLIC PANEL
BUN/Creatinine Ratio: 9 (ref 9–23)
BUN: 19 mg/dL (ref 6–24)
CO2: 21 mmol/L (ref 20–29)
Calcium: 9.7 mg/dL (ref 8.7–10.2)
Chloride: 100 mmol/L (ref 96–106)
Creatinine, Ser: 2.08 mg/dL — ABNORMAL HIGH (ref 0.57–1.00)
Glucose: 80 mg/dL (ref 65–99)
Potassium: 4.3 mmol/L (ref 3.5–5.2)
Sodium: 139 mmol/L (ref 134–144)
eGFR: 28 mL/min/{1.73_m2} — ABNORMAL LOW (ref >59)

## 2020-09-13 ENCOUNTER — Encounter: Payer: Self-pay | Admitting: Family Medicine

## 2020-09-13 NOTE — Assessment & Plan Note (Addendum)
Uncontrolled.  BP 160/90.  Elevated BP likely multifactorial given her concurrent migraine as well as reporting being out Lasix.  She is taking 60 mg twice daily.  Refills sent to pharmacy.  Weight is up from baseline.  Continue Coreg, amlodipine.  Blood pressure is also being managed by nephrology and cardiology.  Defer significant medication changes to specialists given her history of renal artery stenosis. Repeat BMP today.

## 2020-09-13 NOTE — Assessment & Plan Note (Signed)
Acute exacerbation.   Neurology started her on on Aimovig.  She is due for this shot but is apprehensive because it also causes hypertension.  Missed her neurology appointment in January.  Advised patient to reschedule neurology appointment.  No head imaging for review.  For acute headache, NSAIDs avoided given history of renal artery stenosis, take 1000 mg Tylenol 4 times daily as needed, Compazine 10 mg 3 times daily as needed with 25 mg Benadryl.  ED precautions given, patient and granddaughter voiced understanding.

## 2020-09-20 ENCOUNTER — Encounter: Payer: Self-pay | Admitting: Neurology

## 2020-09-20 NOTE — Progress Notes (Signed)
Heidi Rivera (Key: [id number]) Aimovig '70MG'$ /ML auto-injectors   Form [employer] control surveyor Form (CB) Created 4 minutes ago Sent to Plan 2 minutes ago Plan Response 2 minutes ago Submit Clinical Questions less than a minute ago Determination Favorable less than a minute ago Message from Plan Effective from 09/20/2020 through 12/12/2020.

## 2020-10-05 DIAGNOSIS — Z03818 Encounter for observation for suspected exposure to other biological agents ruled out: Secondary | ICD-10-CM | POA: Diagnosis not present

## 2020-12-03 NOTE — Progress Notes (Signed)
[name] (Key: [id number])  Your information has been submitted to [place]. [employer] [place] will review the request and notify you of the determination decision directly, typically within 72 hours of receiving all information.  You will also receive your request decision electronically. To check for an update later, open this request again from your dashboard.  If [employer] has not responded within the specified timeframe or if you have any questions about your PA submission, contact [employer] directly at [phone]

## 2020-12-07 ENCOUNTER — Ambulatory Visit (HOSPITAL_COMMUNITY)
Admission: RE | Admit: 2020-12-07 | Payer: BC Managed Care – PPO | Source: Ambulatory Visit | Attending: Cardiovascular Disease | Admitting: Cardiovascular Disease

## 2020-12-17 NOTE — Progress Notes (Signed)
Heidi Rivera (Key: [id number]) Aimovig '70MG'$ /ML auto-injectors   Form [employer] Building control surveyor Form (CB) Created 17 days ago Sent to Plan 14 days ago Plan Response 14 days ago Submit Clinical Questions 14 days ago Determination Favorable 11 days ago Message from Plan Effective from 12/03/2020 through 12/02/2021.

## 2020-12-21 ENCOUNTER — Other Ambulatory Visit: Payer: Self-pay | Admitting: Cardiovascular Disease

## 2020-12-21 ENCOUNTER — Other Ambulatory Visit: Payer: Self-pay

## 2020-12-21 ENCOUNTER — Telehealth (INDEPENDENT_AMBULATORY_CARE_PROVIDER_SITE_OTHER): Payer: BC Managed Care – PPO | Admitting: Family Medicine

## 2020-12-21 DIAGNOSIS — I701 Atherosclerosis of renal artery: Secondary | ICD-10-CM | POA: Diagnosis not present

## 2020-12-21 DIAGNOSIS — N1832 Chronic kidney disease, stage 3b: Secondary | ICD-10-CM

## 2020-12-21 DIAGNOSIS — G43019 Migraine without aura, intractable, without status migrainosus: Secondary | ICD-10-CM

## 2020-12-21 DIAGNOSIS — I150 Renovascular hypertension: Secondary | ICD-10-CM | POA: Diagnosis not present

## 2020-12-21 MED ORDER — DEXAMETHASONE 4 MG PO TABS: 8.0000 mg | ORAL_TABLET | Freq: Once | ORAL | 0 refills | Status: AC

## 2020-12-21 MED ORDER — VALPROIC ACID 250 MG PO CAPS: 500.0000 mg | ORAL_CAPSULE | Freq: Once | ORAL | 0 refills | Status: DC

## 2020-12-21 NOTE — Assessment & Plan Note (Signed)
BP per last check by patient at home was 190/112, currently dealing with persistent migraines and in acute pain. Patient has been compliant with her antihypertensives. Reports new onset of persistent blurred vision, unsure if related to migraines or blood pressure. Recommended patient to go to urgent care for neurologic evaluation due to new blurred vision.

## 2020-12-21 NOTE — Assessment & Plan Note (Deleted)
BP per last check by patient at home was 190/112, currently dealing with persistent migraines and in acute pain. Patient has been compliant with her antihypertensives. Reports new onset of persistent blurred vision, unsure if related to migraines or blood pressure. Recommended patient to go to urgent care for neurologic evaluation due to new blurred vision.

## 2020-12-21 NOTE — Progress Notes (Signed)
Experiment Telemedicine Visit  Patient consented to have virtual visit and was identified by name and date of birth. Method of visit: Video  Encounter participants: Patient: Heidi Rivera - located at Home Provider: Rise Patience - located off [facility]  Chief Complaint: Migraines and hypertension  HPI:  Patient has a history of migraines. Notes that she has had one since last Saturday that waxes and wanes in intensity, with a propensity to get worse later in the day. The migraines woke her up from sleep in excruciating pain overnight, which has made it difficult for her to do her job. She was previously followed by neurology taking shots and has done multiple medications in the past that have not been much help. She is currently having a lot of nausea and head pressure.   Recently has been doing a migraine cocktail of '1500mg'$  Tylenol, '50mg'$  benadryl, and '10mg'$  of compazine. This cocktail makes her go to sleep so she is only able to take it after work and notes that her headache will still be present in less intensity once she wakes up. She does use peppermint oil at work with minimal improvement in symptoms.  Tried OTC sinus medication to see if that would help and there was no relief.   Does not have aura from her migraines that she is aware of. But she is starting to have some blurry vision this week and it has been staying, which is different from her normal    Also has a history of hypertension with renal artery stenosis and has been compliant with her medications. She has been taking her blood pressures over the last few days and notes that the pressures range from A999333 systolic and in the [id number] for diastolic pressures.   ROS: per HPI  Pertinent PMHx: Renal artery stenosis  Exam:  There were no vitals taken for this visit.  Respiratory: breathing comfortably on room air, speaking in full sentences, no respiratory distress  General: appears overall  well via video visit, no obvious signs of distress at this time.  Assessment/Plan:  Migraine without aura Acute exacerbation for >5 days. Patient has been taking a cocktail of '1500mg'$  Tylenol, Compazine '10mg'$ , and Benadryl '50mg'$  for the migraine when she is home from work which puts her to sleep. Still waking from sleep with severe migraines. New blurry vision, which has been persistent since last week. Was previously following with neurology but has not since she discontinued Aimovig. Significantly elevated blood pressures with some systolic measurements [id number]. Due to vision changes with the blood pressure and possible status migranosus, highly recommended patient go to urgent care (if no clinic appointments available) for a neurologic examination. Discussed case with Dr. McDiarmid, who also recommended a single dose trial of dexamethasone and valproate with further evaluation. - valproic acid '500mg'$  x1 dose - Dexamethasone 8g x1 dose - Recommend neuro evaluation soon given blurry vision - Recommend following up with neurology for migraine control - Return precautions given - Patient to avoid NSAIDs due to renal artery stenosis  Hypertension BP per last check by patient at home was 190/112, currently dealing with persistent migraines and in acute pain. Patient has been compliant with her antihypertensives. Reports new onset of persistent blurred vision, unsure if related to migraines or blood pressure. Recommended patient to go to urgent care for neurologic evaluation due to new blurred vision.     Time spent during visit with patient: 14 minutes

## 2020-12-21 NOTE — Assessment & Plan Note (Signed)
Acute exacerbation for >5 days. Patient has been taking a cocktail of '1500mg'$  Tylenol, Compazine '10mg'$ , and Benadryl '50mg'$  for the migraine when she is home from work which puts her to sleep. Still waking from sleep with severe migraines. New blurry vision, which has been persistent since last week. Was previously following with neurology but has not since she discontinued Aimovig. Significantly elevated blood pressures with some systolic measurements [id number]. Due to vision changes with the blood pressure and possible status migranosus, highly recommended patient go to urgent care (if no clinic appointments available) for a neurologic examination. Discussed case with Dr. McDiarmid, who also recommended a single dose trial of dexamethasone and valproate with further evaluation. - valproic acid '500mg'$  x1 dose - Dexamethasone 8g x1 dose - Recommend neuro evaluation soon given blurry vision - Recommend following up with neurology for migraine control - Return precautions given - Patient to avoid NSAIDs due to renal artery stenosis

## 2020-12-24 DIAGNOSIS — E278 Other specified disorders of adrenal gland: Secondary | ICD-10-CM | POA: Diagnosis not present

## 2020-12-24 DIAGNOSIS — I129 Hypertensive chronic kidney disease with stage 1 through stage 4 chronic kidney disease, or unspecified chronic kidney disease: Secondary | ICD-10-CM | POA: Diagnosis not present

## 2020-12-24 DIAGNOSIS — N1832 Chronic kidney disease, stage 3b: Secondary | ICD-10-CM | POA: Diagnosis not present

## 2020-12-24 DIAGNOSIS — I701 Atherosclerosis of renal artery: Secondary | ICD-10-CM | POA: Diagnosis not present

## 2021-01-23 ENCOUNTER — Other Ambulatory Visit: Payer: Self-pay

## 2021-01-23 DIAGNOSIS — N3946 Mixed incontinence: Secondary | ICD-10-CM

## 2021-01-24 ENCOUNTER — Other Ambulatory Visit: Payer: Self-pay | Admitting: *Deleted

## 2021-01-24 MED ORDER — CARVEDILOL 6.25 MG PO TABS: 6.2500 mg | ORAL_TABLET | Freq: Two times a day (BID) | ORAL | 0 refills | Status: DC

## 2021-01-24 MED ORDER — OXYBUTYNIN CHLORIDE 5 MG PO TABS: 5.0000 mg | ORAL_TABLET | Freq: Two times a day (BID) | ORAL | 2 refills | Status: DC

## 2021-02-08 ENCOUNTER — Other Ambulatory Visit (HOSPITAL_COMMUNITY): Payer: Self-pay

## 2021-02-12 ENCOUNTER — Other Ambulatory Visit: Payer: Self-pay

## 2021-02-12 ENCOUNTER — Encounter: Payer: Self-pay | Admitting: Family Medicine

## 2021-02-12 ENCOUNTER — Ambulatory Visit (INDEPENDENT_AMBULATORY_CARE_PROVIDER_SITE_OTHER): Payer: BC Managed Care – PPO | Admitting: Family Medicine

## 2021-02-12 VITALS — BP 169/104 | HR 59 | Ht 69.0 in | Wt 319.2 lb

## 2021-02-12 DIAGNOSIS — I10 Essential (primary) hypertension: Secondary | ICD-10-CM | POA: Diagnosis not present

## 2021-02-12 DIAGNOSIS — L918 Other hypertrophic disorders of the skin: Secondary | ICD-10-CM | POA: Insufficient documentation

## 2021-02-12 DIAGNOSIS — H60501 Unspecified acute noninfective otitis externa, right ear: Secondary | ICD-10-CM

## 2021-02-12 MED ORDER — CIPRO HC 0.2-1 % OT SUSP: 3.0000 [drp] | Freq: Two times a day (BID) | OTIC | 0 refills | Status: DC

## 2021-02-12 MED ORDER — HYDRALAZINE HCL 10 MG PO TABS: 10.0000 mg | ORAL_TABLET | Freq: Three times a day (TID) | ORAL | 1 refills | Status: DC

## 2021-02-12 NOTE — Assessment & Plan Note (Addendum)
Only recently started irritating patient. Slightly erythematous around base. No signs of infection. Patient would like it removed. Given size and thick base of skin tag, recommended removal with lidocaine for patient comfort. Plan to remove skin tag at RTC visit in 1 week for BP check.

## 2021-02-12 NOTE — Patient Instructions (Signed)
We are prescribed an eardrop for an infection in the ear canal.  Use these in your right ear twice daily for 7 days.  This should resolve your symptoms.  For your high blood pressure I would like to start a medication called hydralazine which you can take 3 times a day.  We can start at 10 mg and I have prescribed this to your pharmacy.  I would like to see back in 1 week for blood pressure recheck.  I would like for you to check your pressures in the meantime and write these down so we know which her pressures are looking like at home.   At your next appointment we can take off the skin tag on your left side if you would like, it would probably be best to numb it up because of its size and so we can do that when I see you back.

## 2021-02-12 NOTE — Assessment & Plan Note (Addendum)
Elevated at 169/104 today in office. Discussed possible medication adjustment. Already on max dose of amlodipine at 10 mg. Hesitant to increase clopidogrel '75mg'$  given HR of 59 in office today. Hesitant to increase irbesartan given kidney function, Cr of 2.8.  Adding hydralazine '10mg'$  3x a day to BP medication regimen to address persistent BP elevation. Pt to keep checking BP at home. Pt to reach out to nephrologist to schedule appointment to discuss BP medication options to preserve kidney function. RTC in 1 week for BP check.

## 2021-02-12 NOTE — Progress Notes (Deleted)
    SUBJECTIVE:   CHIEF COMPLAINT / HPI:   ***  PERTINENT  PMH / PSH: ***  OBJECTIVE:   BP (!) 169/104   Pulse (!) 59   Ht '5\' 9"'$  (1.753 m)   Wt (!) 319 lb 3.2 oz (144.8 kg)   LMP 02/10/2021   SpO2 100%   BMI 47.14 kg/m  ***  General: NAD, pleasant, able to participate in exam Cardiac: RRR, no murmurs. Respiratory: CTAB, normal effort, No wheezes, rales or rhonchi Abdomen: Bowel sounds present, nontender, nondistended, no hepatosplenomegaly. Extremities: no edema or cyanosis. Skin: warm and dry, no rashes noted Neuro: alert, no obvious focal deficits Psych: Normal affect and mood  ASSESSMENT/PLAN:   No problem-specific Assessment & Plan notes found for this encounter.     Lurline Del, [place]    {    This will disappear when note is signed, click to select method of visit    :1}

## 2021-02-12 NOTE — Progress Notes (Signed)
I agree with the above documentation of student-doctor Santanam.  I have seen and evaluated the patient alongside her and agree with the assessment and plan.  Treating otitis externa with the drops as below.  Patient to follow-up in 1 week for blood pressure recheck after initiating hydralazine and for removal of irritated skin tag.  Skin tag showed no signs of infection but did have some irritation and inflammation likely due to rubbing against the patient's clothing.  See above note for additional information.

## 2021-02-12 NOTE — Progress Notes (Addendum)
    SUBJECTIVE:   CHIEF COMPLAINT / HPI:   Heidi Rivera presents today for R ear pain.  It started about 1.5 weeks ago and has been itching intermittently. On Sunday it started throbbing and hurting her. She endorse 4/10 pain. No drainage. Itching occurs mostly at night. She has occasionally itched her right ear with her fingernail but otherwise has not put anything in her ear canal, including a Q-tip. She has tried putting hydrogen peroxide with a cotton ball near her ear and not found any relief.   Denies dizziness, headache, N/V, fever.  Hypertension - 169/104 in office today. Patient endorses elevated BP at past visit with neprhologist but past visit was cancelled. She is worried about her hypertension and effects on her kidneys  Skin tag - Has a large skin tag on the left side near her bra that has been very irritated. Inquires if there is anything she can do for that.   PERTINENT  PMH / PSH:  HTN Renal artery stenosis CKD Stage 3 Costochondritis   OBJECTIVE:   BP (!) 169/104   Pulse (!) 59   Ht '5\' 9"'$  (1.753 m)   Wt (!) 319 lb 3.2 oz (144.8 kg)   LMP 02/10/2021   SpO2 100%   BMI 47.14 kg/m   GEN: Well appearing, in no acute distress HEENT: Tympanic membranes clear and pink on the left and slightly erythematous on the right. R ear canal is erythematous.  CARD: Regular rate and rhythm. No murmurs or gallops. PULM: Clear to auscultation, normal work of breathing    EXTREMITIES: No edema. SKIN: Warm dry. Skin tag on left side of torso near bra that is slightly erythematous and tender.    ASSESSMENT/PLAN:   Inflamed skin tag Only recently started irritating patient. Slightly erythematous around base. No signs of infection. Patient would like it removed. Given size and thick base of skin tag, recommended removal with lidocaine for patient comfort. Plan to remove skin tag at RTC visit in 1 week for BP check.  Hypertension Elevated at 169/104 today in office. Discussed  possible medication adjustment. Already on max dose of amlodipine. Not increasing clopidogrel given HR of 59 in office today. Hesitant to increase irbesartan given kidney function, Cr of 2.8.  Adding hydralazine '10mg'$  3x a day to BP medication regimen Pt to keep checking BP at home. Pt to reach out to nephrologist to schedule appointment to discuss medication options. RTC in 1 weeks for BP check.   R ear pain Presentation consistent with otitis externa. Prescribed ciprofloxacin-hydrocortisone OTIC suspension.   Reatha Harps, Medical Student   I agree with the above documentation of student-doctor Jerimy Johanson.  I have seen and evaluated the patient alongside her and agree with the assessment and plan.  Treating otitis externa with the drops as below.  Patient to follow-up in 1 week for blood pressure recheck after initiating hydralazine and for removal of irritated skin tag.  Skin tag showed no signs of infection but did have some irritation and inflammation likely due to rubbing against the patient's clothing.  See above note for additional information.  Lurline Del, [place]

## 2021-02-12 NOTE — Assessment & Plan Note (Deleted)
Elevated at 169/104 today in office. Discussed possible medication adjustment. Already on max dose of amlodipine. Not increasing clopidogrel given HR of 59 in office today. Hesitant to increase irbesartan given kidney function, Cr of 2.8.  Adding hydralazine '10mg'$  3x a day to BP medication regimen Pt to keep checking BP at home. Pt to reach out to nephrologist to schedule appointment to discuss medication options. RTC in 1 weeks for BP check.

## 2021-02-14 ENCOUNTER — Other Ambulatory Visit: Payer: Self-pay

## 2021-02-15 ENCOUNTER — Other Ambulatory Visit: Payer: Self-pay | Admitting: Family Medicine

## 2021-02-15 ENCOUNTER — Telehealth: Payer: Self-pay

## 2021-02-15 MED ORDER — CIPRO HC 0.2-1 % OT SUSP: OTIC | 0 refills | Status: DC

## 2021-02-15 NOTE — Telephone Encounter (Signed)
Patient calls nurse line stating the ear drops called in on 9/20 are ~300 dollars with patients insurance. I called the pharmacy to see what other cost effective treatments are available. Pharmacist stated, "anything generic should be covered by patients insurance." I called the patients insurance and below are $35 alternatives. However, they must be written for a 30 day supply.   Ciprodex 0.3-0.1% OTIC Suspension   Neomycin-Polymyxin-Hydrocortisone 1%  Please advise on alternative.

## 2021-02-17 ENCOUNTER — Encounter: Payer: Self-pay | Admitting: Family Medicine

## 2021-02-18 ENCOUNTER — Other Ambulatory Visit: Payer: Self-pay | Admitting: Family Medicine

## 2021-02-18 MED ORDER — NEOMYCIN-POLYMYXIN-HC 1 % OT SOLN: 3.0000 [drp] | Freq: Four times a day (QID) | OTIC | 0 refills | Status: AC

## 2021-02-18 MED ORDER — NEOMYCIN-POLYMYXIN-HC 1 % OT SOLN: 3.0000 [drp] | Freq: Four times a day (QID) | OTIC | 0 refills | Status: DC

## 2021-02-18 NOTE — Telephone Encounter (Signed)
Patient returns call to nurse line regarding being unable to get ear drops. Called pharmacy. Pharmacist states that they need specific rx and that they cannot substitute without written order.   Please advise.   Talbot Grumbling, RN

## 2021-02-18 NOTE — Progress Notes (Signed)
Called pharmacy and attempted to see if we can find a solution that the patient's insurance will cover for her otitis externa.  Pharmacist was kind enough to run a few things to her insurance to see what it would cost and none of them came out at an affordable range.  Pharmacist did recommend trying something like Cortisporin drops.  Upon further review this does seem like an appropriate regimen for this patient and I sent this in for the pharmacy.  I will call the patient later today and let her know.

## 2021-02-22 ENCOUNTER — Ambulatory Visit (INDEPENDENT_AMBULATORY_CARE_PROVIDER_SITE_OTHER): Payer: BC Managed Care – PPO | Admitting: Family Medicine

## 2021-02-22 ENCOUNTER — Other Ambulatory Visit: Payer: Self-pay

## 2021-02-22 VITALS — BP 178/106 | HR 65 | Ht 69.0 in | Wt 319.5 lb

## 2021-02-22 DIAGNOSIS — E119 Type 2 diabetes mellitus without complications: Secondary | ICD-10-CM

## 2021-02-22 DIAGNOSIS — I10 Essential (primary) hypertension: Secondary | ICD-10-CM

## 2021-02-22 NOTE — Progress Notes (Signed)
    SUBJECTIVE:   CHIEF COMPLAINT / HPI:   Ms. Parquette is a 55yo with HTN, CKD III, renal artery stenosis s/p stent in 2021, who presents for BP check and removal of skin tag   She was seen on 9/20 at the clinic with a BP of 169/104.  Was on Amlodipine '10mg'$ ,  Hydralazine 10 mg 3 TID was added.  BP today 184/102, on repeat 178/106. Does endorse migraine with blurry vision though she states she has this on occasion chronically. Stress contributing factor. Has been in a lot of stress this month.  BP at home same range, was 172/98 on Wed which was lower for her   Migraines: Occurs on average 1-3 times a week. Sometimes has them 3 days straight without relief, states she used to take shots with Neurologist and that after the first few months helped but then stopped helping  She states at previous clinic visit was prescribed a cocktail of Tylenol, benadryl and nausea medication but it puts her to sleep. Does not give day time relief   Has appointment scheduled with nehprology    OBJECTIVE:   BP (!) 184/102   Pulse 65   Ht '5\' 9"'$  (1.753 m)   Wt (!) 319 lb 8 oz (144.9 kg)   LMP 02/10/2021   SpO2 97%   BMI 47.18 kg/m    General: alert, pleasant, NAD CV: RRR no murmurs Resp: CTAB normal WOB GI: soft, non distended Derm: no visible lesions. No LE edema Neruo: CN 2-12 in tact. Normal sensation bilaterally   ASSESSMENT/PLAN:   No problem-specific Assessment & Plan notes found for this encounter.  Hypertension BP today 178/106.  On 9/20 BP was 169/104 on amlodipine 10 mg daily, Coreg 6.25 mg twice daily, Lasix 60 mg daily. Hydralazine '10mg'$  TID was added. She does have a migraine with blurry vision, and she states this is how her migraines typically present. Could potentially be due to her blood pressure consistently being elevated. Denies CP, SOP, dyspnea, edema. There are discrepancies between patient's chart and her reported medications so advised patient to return in the next week with  all medications or send a mychart of them and she chose the former. At follow up should increase dose if appropriate or change medication. Gave strict return precautions. -BMP, CBC - return next week for BP follow up with all medications   Did not address skin tag at this visit due to BP. Advised patient to follow up   Shary Key, [place]

## 2021-02-22 NOTE — Patient Instructions (Signed)
It was great seeing you today!  Today you came in for a blood pressure check and it was very elevated at 184/102. Hydralazine was just added to your regimen a little over a week ago. I would like you to schedule a follow up appointment and bring all of your medications with you (or type them all in mychart and send [place] a message) so we can titrate your medications and/or add additional ones.   Today we will check your blood work. I will call you with anything abnormal and will send a mychart with normal results.  Please ensure you schedule to see your nephrologist and cardiologist.   Please check-out at the front desk before leaving the clinic. Schedule to see your PCP in the next week or soonest avaible appointment.  Please bring all of your medications with you to each visit.   Feel free to call with any questions or concerns at any time, at [phone].   Take care,  Dr. Shary Key [facility]

## 2021-02-23 LAB — CBC
Hematocrit: 42.2 % (ref 34.0–46.6)
Hemoglobin: 13.8 g/dL (ref 11.1–15.9)
MCH: 27.5 pg (ref 26.6–33.0)
MCHC: 32.7 g/dL (ref 31.5–35.7)
MCV: 84 fL (ref 79–97)
Platelets: 211 10*3/uL (ref 150–450)
RBC: 5.01 x10E6/uL (ref 3.77–5.28)
RDW: 13.4 % (ref 11.7–15.4)
WBC: 5.2 10*3/uL (ref 3.4–10.8)

## 2021-02-23 LAB — HEMOGLOBIN A1C
Est. average glucose Bld gHb Est-mCnc: 146 mg/dL
Hgb A1c MFr Bld: 6.7 % — ABNORMAL HIGH (ref 4.8–5.6)

## 2021-02-23 LAB — BASIC METABOLIC PANEL
BUN/Creatinine Ratio: 8 — ABNORMAL LOW (ref 9–23)
BUN: 20 mg/dL (ref 6–24)
CO2: 25 mmol/L (ref 20–29)
Calcium: 9.3 mg/dL (ref 8.7–10.2)
Chloride: 101 mmol/L (ref 96–106)
Creatinine, Ser: 2.54 mg/dL — ABNORMAL HIGH (ref 0.57–1.00)
Glucose: 103 mg/dL — ABNORMAL HIGH (ref 70–99)
Potassium: 4.9 mmol/L (ref 3.5–5.2)
Sodium: 143 mmol/L (ref 134–144)
eGFR: 22 mL/min/{1.73_m2} — ABNORMAL LOW (ref >59)

## 2021-02-28 ENCOUNTER — Telehealth: Payer: Self-pay

## 2021-02-28 NOTE — Telephone Encounter (Signed)
New message   Heidi Rivera (Key: [id number]) Aimovig '70MG'$ /ML auto-injectors   Form Blue Building control surveyor Form (CB) Created 1 day ago Sent to Plan 8 minutes ago Plan Response 8 minutes ago Submit Clinical Questions less than a minute ago Determination Wait for Determination Please wait for [employer] to return a determination.

## 2021-02-28 NOTE — Telephone Encounter (Signed)
F/u   Received fax from [facility]   Medication approval for Aimovig  70 mg   Effective date 12/03/2020 to 12/02/2021

## 2021-03-01 DIAGNOSIS — I129 Hypertensive chronic kidney disease with stage 1 through stage 4 chronic kidney disease, or unspecified chronic kidney disease: Secondary | ICD-10-CM | POA: Diagnosis not present

## 2021-03-01 DIAGNOSIS — N1832 Chronic kidney disease, stage 3b: Secondary | ICD-10-CM | POA: Diagnosis not present

## 2021-03-08 NOTE — Progress Notes (Signed)
NEUROLOGY FOLLOW UP OFFICE NOTE  Heidi Rivera [id number]  Assessment/Plan:   Migraine with aura, without status migrainosus, not intractable Hypertension  Migraine prevention:  Start Emgality Migraine rescue:  rizatriptan '10mg'$  - continue Compazine for nausea Limit use of pain relievers to no more than 2 days out of week to prevent risk of rebound or medication-overuse headache. Keep headache diary Follow up with PCP regarding blood pressure Follow up 6 months.   Subjective:  Heidi Rivera is a 55 year old right-handed black female with HTN and CKD stage III who follows up for migraines and lumbar radiculitis.  UPDATE: Last seen August 2021. Started Aimovig in April 2021. Helped for a few months but then ineffective. Intensity:  severe Duration:  3 days Frequency:  once a week  Current NSAIDS:  Tylenol Current analgesics:  none Current triptans:  none Current ergotamine:  none Current anti-emetic:  Compazine '10mg'$  Current muscle relaxants:  none Current anti-anxiolytic:  none Current sleep aide:  none Current Antihypertensive medications:  Amlodipine; losartan  Current Antidepressant medications:  none Current Anticonvulsant medications:  none Current anti-CGRP:  none Current Vitamins/Herbal/Supplements:  none Current Antihistamines/Decongestants:  none Other therapy:  none Hormone/birth control:  none  Caffeine:  No coffee or soda. Diet:  Water.  1 bottle of iced green tea.  Seeing a dietician.   Exercise:  Not routine Depression:  some; Anxiety:  some Other pain:  no Sleep:  Okay.  Questionable OSA as she has daytime fatigue.   HISTORY: Migraine: She has had migraines since her early 30s.  They are severe bifrontal-temporal pressure over her eyes.  There is associated nausea, decreased appetite, photophobia, phonophobia, osmophobia and neck stiffness but no vomiting, speech disturbance, weakness or numbness.  Over the past year, she was having  uncontrolled hypertension and was diagnosed with CKD stage 3.  Migraines started to become less severe but associated with visual aura of seeing bright spots in her vision.  They were lasting 3 to 4 days.  Sumatriptan is helpful, in which she gets relief in 30-60 minutes and completely resolves in about 1 to 2 days.  In past 3 weeks, she has had 3 migraines.  The smell of perfume and onions trigger them.  She was previously taking Excedrin Migraine frequently but stopped as it contributed to her kidney dysfunction.  Lumbar Radiculitis: In mid-August 2021, she developed left lower back pain.  No preceding trauma or precipitating physical activity.  Prolonged sitting aggravated it.  Pain progressively got worse, causing numbness and tingling along the lateral and anterior left thigh down lateral leg and over dorsum of foot with associated shooting pain.  No bowel or bladder dysfunction, saddle anesthesia or focal lower extremity weakness.  However, due to severe pain, she was unable to walk and was stuck on the toilet for 45 minutes because she couldn't stand.  She went to the ED on 01/13/2020 where she was prescribed Flexeril and a prednisone taper.  She is now able to ambulate but still with pain.  Started gabapentin for left lumbar radiculitis in August 2021.  MRI of lumbar spine on 02/03/2020 personally reviewed showed mild multilevel degenerative changes and multilevel facet arthropathy with minor congenital canal stenosis at L2-3 and L3-4 but no high-grade canal or foraminal stenosis.  She has since been found to have bilateral tricompartmental osteoarthritis with small knee joint effusion bilaterally.    Past NSAIDS:  Ibuprofen, naproxen Past analgesics:  Excedrin Migraine (effective); acetaminophen; tramadol Past abortive triptans:  sumatriptan '100mg'$  Past abortive ergotamine:  none Past muscle relaxants:  none Past anti-emetic:  Zofran Past antihypertensive medications:  none Past antidepressant  medications:  Amitriptyline '10mg'$  (caused elevated blood pressure) Past anticonvulsant medications:  topiramate, Depakote, gabapentin Past anti-CGRP:  Aimovig '70mg'$  (helpful for 4-5 months) Past vitamins/Herbal/Supplements:  none Past antihistamines/decongestants:  Flonase Other past therapies:  none    PAST MEDICAL HISTORY: Past Medical History:  Diagnosis Date   Acute gastritis    CHRONIC KIDNEY DISEASE STAGE III (MODERATE) 07/11/2008   Qualifier: Diagnosis of  By: Tye Savoy MD, [place]     Chronic left-sided low back pain with left-sided sciatica 07/23/2006   Qualifier: Diagnosis of  By: Herma Ard     Costochondritis, acute 06/17/2019   Essential hypertension, benign 08/30/2007   Qualifier: Diagnosis of  By: Darylene Price MD, Mark     Gout    Hypertension    Laryngitis 10/13/2019   Migraine without aura 07/11/2008   Qualifier: Diagnosis of  By: Carlena Sax  MD, Stephanie     Migraines    Obesity    OBESITY, NOS 07/23/2006   Qualifier: Diagnosis of  By: Herma Ard     Stress incontinence 06/17/2019   Vertigo     MEDICATIONS: Current Outpatient Medications on File Prior to Visit  Medication Sig Dispense Refill   acetaminophen (TYLENOL) 500 MG tablet Take 2 tablets (1,000 mg total) by mouth every 8 (eight) hours as needed. 30 tablet 0   albuterol (VENTOLIN HFA) 108 (90 Base) MCG/ACT inhaler Inhale 2 puffs into the lungs every 6 (six) hours as needed for wheezing or shortness of breath. 1 each 1   amLODipine (NORVASC) 5 MG tablet Take 1 tablet (5 mg total) by mouth at bedtime. 90 tablet 3   Blood Pressure Monitoring (BLOOD PRESSURE CUFF) MISC 1 Device by Does not apply route daily. 1 each 0   carvedilol (COREG) 6.25 MG tablet Take 1 tablet (6.25 mg total) by mouth 2 (two) times daily. 180 tablet 0   clopidogrel (PLAVIX) 75 MG tablet Take 1 tablet by mouth once daily 90 tablet 3   cyclobenzaprine (FLEXERIL) 10 MG tablet Take 1 tablet (10 mg total) by mouth 3 (three) times daily as needed  for muscle spasms. 20 tablet 0   diphenhydrAMINE (BENADRYL ALLERGY) 25 mg capsule Take 1 capsule (25 mg total) by mouth every 6 (six) hours as needed. 30 capsule 0   Erenumab-aooe (AIMOVIG) 70 MG/ML SOAJ Inject 70 mg into the skin every 28 (twenty-eight) days. 1 pen 11   furosemide (LASIX) 40 MG tablet Take 1.5 tablets (60 mg total) by mouth daily. 135 tablet 2   hydrALAZINE (APRESOLINE) 10 MG tablet Take 1 tablet (10 mg total) by mouth 3 (three) times daily. 90 tablet 1   irbesartan (AVAPRO) 150 MG tablet Take 1 tablet (150 mg total) by mouth daily. 30 tablet 1   ketoconazole (NIZORAL) 2 % cream Apply 1 application topically daily. For 14 days 15 g 0   meclizine (ANTIVERT) 12.5 MG tablet Take 1 tablet (12.5 mg total) by mouth 3 (three) times daily as needed for dizziness or nausea. 30 tablet 0   oxybutynin (DITROPAN) 5 MG tablet Take 1 tablet (5 mg total) by mouth 2 (two) times daily. 30 tablet 2   pravastatin (PRAVACHOL) 40 MG tablet Take 1 tablet (40 mg total) by mouth at bedtime. 90 tablet 3   prochlorperazine (COMPAZINE) 10 MG tablet Take 1 tablet (10 mg total) by mouth every 6 (six) hours as  needed for nausea or vomiting. 30 tablet 0   SUMAtriptan (IMITREX) 100 MG tablet Take 1 tablet earliest onset of migraine.  May repeat in 2 hours if headache persists or recurs.  Maximum 2 tablets in 24 hours. (Patient taking differently: Take 100 mg by mouth every 2 (two) hours as needed for migraine. Take 1 tablet earliest onset of migraine.  May repeat in 2 hours if headache persists or recurs.  Maximum 2 tablets in 24 hours.) 10 tablet 3   valproic acid (DEPAKENE) 250 MG capsule Take 2 capsules (500 mg total) by mouth once for 1 dose. 2 capsule 0   Vitamin D, Ergocalciferol, (DRISDOL) 1.25 MG (50000 UNIT) CAPS capsule Take 50,000 Units by mouth every Monday.      [DISCONTINUED] fluticasone (FLONASE) 50 MCG/ACT nasal spray Place 2 sprays into both nostrils daily. 1 g 0   No current facility-administered  medications on file prior to visit.    ALLERGIES: Allergies  Allergen Reactions   Tramadol Other (See Comments)    Intolerance : GI Upset    FAMILY HISTORY: Family History  Problem Relation Age of Onset   Diabetes Mother    Breast cancer Mother    Other Mother        Larynx cancer   Diabetes Maternal Aunt    Hypertension Father    Hypercholesterolemia Father    Other Father        DDD   Cancer - Other Paternal Grandmother        bone   Colon cancer Maternal Uncle       Objective:  Blood pressure (!) 164/96, pulse (!) 57, height '5\' 9"'$  (1.753 m), weight (!) 319 lb 9.6 oz (145 kg), last menstrual period 02/10/2021, SpO2 98 %. General: No acute distress.  Patient appears well-groomed.   Head:  Normocephalic/atraumatic Eyes:  Fundi examined but not visualized Neck: supple, no paraspinal tenderness, full range of motion Heart:  Regular rate and rhythm Lungs:  Clear to auscultation bilaterally Back: No paraspinal tenderness Neurological Exam: alert and oriented to person, place, and time.  Speech fluent and not dysarthric, language intact.  CN II-XII intact. Bulk and tone normal, muscle strength 5/5 throughout.  Sensation to light touch intact.  Deep tendon reflexes 1+ throughout, toes downgoing.  Finger to nose testing intact.  Gait normal, Romberg negative.   Metta Clines, DO  CC: [name], DO

## 2021-03-11 ENCOUNTER — Other Ambulatory Visit: Payer: Self-pay

## 2021-03-11 ENCOUNTER — Ambulatory Visit (INDEPENDENT_AMBULATORY_CARE_PROVIDER_SITE_OTHER): Payer: BC Managed Care – PPO | Admitting: Family Medicine

## 2021-03-11 ENCOUNTER — Encounter: Payer: Self-pay | Admitting: Family Medicine

## 2021-03-11 ENCOUNTER — Ambulatory Visit (INDEPENDENT_AMBULATORY_CARE_PROVIDER_SITE_OTHER): Payer: BC Managed Care – PPO | Admitting: Neurology

## 2021-03-11 ENCOUNTER — Encounter: Payer: Self-pay | Admitting: Neurology

## 2021-03-11 VITALS — BP 140/78 | HR 63 | Ht 69.0 in | Wt 320.0 lb

## 2021-03-11 VITALS — BP 164/96 | HR 57 | Ht 69.0 in | Wt 319.6 lb

## 2021-03-11 DIAGNOSIS — I10 Essential (primary) hypertension: Secondary | ICD-10-CM

## 2021-03-11 DIAGNOSIS — G43009 Migraine without aura, not intractable, without status migrainosus: Secondary | ICD-10-CM

## 2021-03-11 MED ORDER — EMPAGLIFLOZIN 10 MG PO TABS: 10.0000 mg | ORAL_TABLET | Freq: Every day | ORAL | 0 refills | Status: DC

## 2021-03-11 MED ORDER — EMGALITY 120 MG/ML ~~LOC~~ SOAJ: 240.0000 mg | Freq: Once | SUBCUTANEOUS | 0 refills | Status: AC

## 2021-03-11 MED ORDER — EMGALITY 120 MG/ML ~~LOC~~ SOAJ: 120.0000 mg | SUBCUTANEOUS | 5 refills | Status: DC

## 2021-03-11 MED ORDER — RIZATRIPTAN BENZOATE 10 MG PO TBDP: ORAL_TABLET | ORAL | 5 refills | Status: DC

## 2021-03-11 NOTE — Patient Instructions (Signed)
Start Emgality - 2 injections for first dose, then 1 injection every 28 days Take rizatriptan as needed.  May repeat in 2 hours (maximum 2 tablets in 24 hours) Limit use of pain relievers to no more than 2 days out of week to prevent risk of rebound or medication-overuse headache. Keep headache diary Follow up 6 months.

## 2021-03-11 NOTE — Patient Instructions (Addendum)
It was great seeing you today!  Today we checked on your blood pressure which is lower than it has been in the past at 140/78. I am adding Jardiance to take daily to help also with your diabetes and kidney disease.   Please call to get an appointment with healthy weight and wellness to help with weight loss.   Also call to see your cardiologist.   Please check-out at the front desk before leaving the clinic. Schedule to see your PCP in 2-3 weeks to check on medication and possibly increase dose and check blood pressure (also bring in medication), but if you need to be seen earlier than that for any new issues we're happy to fit you in, just give [place] a call!  Feel free to call with any questions or concerns at any time, at [phone].   Take care,  Dr. Shary Key [facility]    Empagliflozin Tablets What is this medication? EMPAGLIFLOZIN (EM pa gli FLOE zin) treats type 2 diabetes. It works by helping your kidneys remove sugar (glucose) from your blood through the urine, which decreases your blood sugar. It can also be used to lower the risk of heart attack, stroke, and hospitalization for heart failure in people with type 2 diabetes. Changes to diet and exercise are often combined with this medication. This medicine may be used for other purposes; ask your health care provider or pharmacist if you have questions. COMMON BRAND NAME(S): Jardiance What should I tell my care team before I take this medication? They need to know if you have any of these conditions: Dehydration Diabetic ketoacidosis Diet low in salt Eating less due to illness, surgery, dieting, or any other reason Having surgery High cholesterol High levels of potassium in the blood History of pancreatitis or pancreas problems History of yeast infection of the penis or vagina If you often drink alcohol Infections in the bladder, kidneys, or urinary tract Kidney disease Liver disease Low  blood pressure On hemodialysis Problems urinating Type 1 diabetes Uncircumcised female An unusual or allergic reaction to empagliflozin, other medications, foods, dyes, or preservatives Pregnant or trying to get pregnant Breast-feeding How should I use this medication? Take this medication by mouth with water. Take it as directed on the prescription label at the same time every day. You may take it with or without food. Keep taking it unless your care team tells you to stop. A special MedGuide will be given to you by the pharmacist with each prescription and refill. Be sure to read this information carefully each time. Talk to your care team about the use of this medication in children. Special care may be needed. Overdosage: If you think you have taken too much of this medicine contact a poison control center or emergency room at once. NOTE: This medicine is only for you. Do not share this medicine with others. What if I miss a dose? If you miss a dose, take it as soon as you can. If it is almost time for your next dose, take only that dose. Do not take double or extra doses. What may interact with this medication? Alcohol Diuretics Insulin This list may not describe all possible interactions. Give your health care provider a list of all the medicines, herbs, non-prescription drugs, or dietary supplements you use. Also tell them if you smoke, drink alcohol, or use illegal drugs. Some items may interact with your medicine. What should I watch for while using this medication? Visit  your care team for regular checks on your progress. Tell your care team if your symptoms do not start to get better or if they get worse. This medication can cause a serious condition in which there is too much acid in the blood. If you develop nausea, vomiting, stomach pain, unusual tiredness, or breathing problems, stop taking this medication and call your care team right away. If possible, use a ketone dipstick to  check for ketones in your urine. Check with your care team if you have severe diarrhea, nausea, and vomiting, or if you sweat a lot. The loss of too much body fluid may make it dangerous for you to take this medication. A test called the HbA1C (A1C) will be monitored. This is a simple blood test. It measures your blood sugar control over the last 2 to 3 months. You will receive this test every 3 to 6 months. Learn how to check your blood sugar. Learn the symptoms of low and high blood sugar and how to manage them. Always carry a quick-source of sugar with you in case you have symptoms of low blood sugar. Examples include hard sugar candy or glucose tablets. Make sure others know that you can choke if you eat or drink when you develop serious symptoms of low blood sugar, such as seizures or unconsciousness. Get medical help at once. Tell your care team if you have high blood sugar. You might need to change the dose of your medication. If you are sick or exercising more than usual, you may need to change the dose of your medication. What side effects may I notice from receiving this medication? Side effects that you should report to your care team as soon as possible: Allergic reactions-skin rash, itching, hives, swelling of the face, lips, tongue, or throat Dehydration-increased thirst, dry mouth, feeling faint or lightheaded, headache, dark yellow or brown urine Diabetic ketoacidosis (DKA)-increased thirst or amount of urine, dry mouth, fatigue, fruity odor to breath, trouble breathing, stomach pain, nausea, vomiting Genital yeast infection-redness, swelling, pain, or itchiness, odor, thick or lumpy discharge New pain or tenderness, change in skin color, sores or ulcers, infection of the leg or foot Infection or redness, swelling, tenderness, or pain in the genitals, or area from the genitals to the back of the rectum Urinary tract infection (UTI)-burning when passing urine, passing frequent small  amounts of urine, bloody or cloudy urine, pain in the lower back or sides This list may not describe all possible side effects. Call your doctor for medical advice about side effects. You may report side effects to FDA at 1-800-FDA-1088. Where should I keep my medication? Keep out of the reach of children and pets. Store at room temperature between 20 and 25 degrees C (68 and 77 degrees F). Get rid of any unused medication after the expiration date. To get rid of medications that are no longer needed or have expired: Take the medication to a medication take-back program. Check with your pharmacy or law enforcement to find a location. If you cannot return the medication, check the label or package insert to see if the medication should be thrown out in the garbage or flushed down the toilet. If you are not sure, ask your care team. If it is safe to put it in the trash, take the medication out of the container. Mix the medication with cat litter, dirt, coffee grounds, or other unwanted substance. Seal the mixture in a bag or container. Put it in the trash. NOTE:  This sheet is a summary. It may not cover all possible information. If you have questions about this medicine, talk to your doctor, pharmacist, or health care provider.  2022 Elsevier/Gold Standard (2020-07-09 14:18:18)

## 2021-03-11 NOTE — Progress Notes (Signed)
    SUBJECTIVE:   CHIEF COMPLAINT / HPI:   Ms. Heidi Rivera is a 55 yo who presents for follow up on HTN.   She was seen at the clinic on 9/30 with BP of 178/106 and was advised to bring all medication in at follow up. She forgot her medication today. BP checked at neurology office was 164/96 and today BP is 140/78 which she states is lower than she typically sees. She reports taking Amlodipine 10 mg daily, Coreg 6.25 mg twice daily, Lasix 60 mg daily, Hydralazine '10mg'$  TID. Endorses some chest pain tho she feels it is muscular in nature because it is tender she presses on it. Endorses blurry vision when she has migraines which she has chronically and this morning saw neurology. States it is hard to breath when laying down- feels like she coughs and is more SOB when laying on R side.   OBJECTIVE:   BP 140/78   Pulse 63   Ht '5\' 9"'$  (1.753 m)   Wt (!) 320 lb (145.2 kg)   LMP 02/10/2021   SpO2 98%   BMI 47.26 kg/m    Physical exam General: well appearing, NAD Cardiovascular: RRR, no murmurs Lungs: CTAB. Normal WOB Abdomen: soft, non-distended, non-tender Skin: warm, dry. No edema MSK: chest wall tenderness to palpation. Pain non radiating   ASSESSMENT/PLAN:   No problem-specific Assessment & Plan notes found for this encounter.  HTN BP today 140/78. Has migraines chronically which could be related to her persistently elevated BP. Current regimen: Amlodipine 10 mg daily, Coreg 6.25 mg twice daily, Lasix 60 mg daily, and recent addition of Hydralazine '10mg'$  TID. Discussed importance of nutrition and physical activity. Was previously seen at [facility] and I encouraged her to call to be seen again by them. Also discussed scheduling to follow up with her cardiologist  - added Jardiance '10mg'$  to also help with CKD and DM in addition to BP lowering.  - f/u with cardiology  f/u in 2-3 weeks to check on new medication and if able to tolerate, consider increasing to '25mg'$ .  Also discussed bringing medication in    [place], [place]

## 2021-03-12 ENCOUNTER — Telehealth: Payer: Self-pay

## 2021-03-12 NOTE — Telephone Encounter (Signed)
New message   Heidi Rivera Key: [id number] - Rx #: [id number] Need help? Call us at [phone] Status Sent to Plan today Drug Rizatriptan Benzoate '10MG'$  dispersible tablets Form [employer] [place] Commercial Electronic Request Form (CB)     Your information has been submitted to [employer] [place]. Blue Cross [place] will review the request and notify you of the determination decision directly, typically within 72 hours of receiving all information.  You will also receive your request decision electronically. To check for an update later, open this request again from your dashboard.  If [employer] [place] has not responded within the specified timeframe or if you have any questions about your PA submission, contact [place] directly at [phone]

## 2021-03-18 ENCOUNTER — Telehealth: Payer: Self-pay

## 2021-03-18 NOTE — Telephone Encounter (Signed)
New message   [name] Key: [id number] help? Call us at [phone] Status Sent to [place] Drug Rizatriptan Benzoate 10MG  dispersible tablets Form Blue Cross [place] Commercial Electronic Request Form (CB)     Your information has been submitted to [employer] [place]. [employer] Cross [place] will review the request and notify you of the determination decision directly, typically within 72 hours of receiving all information.  You will also receive your request decision electronically. To check for an update later, open this request again from your dashboard.  If [employer] [place] has not responded within the specified timeframe or if you have any questions about your PA submission, contact [place] directly at [phone].

## 2021-03-18 NOTE — Telephone Encounter (Signed)
F/u   This request has received a Cancelled outcome.  This may mean either your patient does not have active coverage with this plan, this authorization was processed as a duplicate request, or an authorization was not needed for this medication.  Note any additional information provided by [facility] at the bottom of this request, and contact [employer] [place] directly for further details.   Tyrina Ulbricht Key: [id number] help? Call us at [phone] Outcome Cancelled today Your request has been cancelled Drug Rizatriptan Benzoate 10MG  dispersible tablets Form Blue Building control surveyor Form (CB)

## 2021-03-18 NOTE — Telephone Encounter (Signed)
F/U   This request has received a Unfavorable outcome.  Please see letter faxed to your office for details on this adverse benefit determination.  Please note any additional information provided by [facility] [place] at the bottom of this request.  [name] Key: [id number] - Rx #: [id number] Need help? Call us at [phone] Outcome [place] October 18 Your request has been denied Drug Rizatriptan Benzoate 10MG  dispersible tablets Form Librarian, academic Form (CB)

## 2021-04-09 ENCOUNTER — Ambulatory Visit (INDEPENDENT_AMBULATORY_CARE_PROVIDER_SITE_OTHER): Payer: BC Managed Care – PPO | Admitting: Family Medicine

## 2021-04-09 ENCOUNTER — Encounter: Payer: Self-pay | Admitting: Family Medicine

## 2021-04-09 ENCOUNTER — Other Ambulatory Visit: Payer: Self-pay

## 2021-04-09 VITALS — BP 173/113 | HR 51 | Ht 69.0 in | Wt 318.8 lb

## 2021-04-09 DIAGNOSIS — H60331 Swimmer's ear, right ear: Secondary | ICD-10-CM

## 2021-04-09 DIAGNOSIS — G43019 Migraine without aura, intractable, without status migrainosus: Secondary | ICD-10-CM

## 2021-04-09 DIAGNOSIS — I10 Essential (primary) hypertension: Secondary | ICD-10-CM | POA: Diagnosis not present

## 2021-04-09 DIAGNOSIS — H6090 Unspecified otitis externa, unspecified ear: Secondary | ICD-10-CM | POA: Diagnosis not present

## 2021-04-09 MED ORDER — CIPRO HC 0.2-1 % OT SUSP: 3.0000 [drp] | Freq: Two times a day (BID) | OTIC | 0 refills | Status: AC

## 2021-04-09 NOTE — Progress Notes (Signed)
   SUBJECTIVE:   CHIEF COMPLAINT / HPI:   Chief Complaint  Patient presents with   Medication Problem   Ear Pain   Diabetes     Heidi Rivera is a 55 y.o. female here for to discuss the number of blood pressure medication she is taking.  Reports taking her medications regularly however her blood pressure is elevated today.  She has concomitant headache with history of migraines.  Follows with neurology for this.  Her antihypertensives include amlodipine, irbesartan, carvedilol, hydralazine and Lasix.  She follows with nephrology as well who is managing her high blood pressure.  Headache is like her typical migraines.  Previous headache cocktail no longer works for her.  She is waiting for insurance to approve a new headache medication.  Has right ear pain.  Previously treated for otitis externa.  Thinks that she may be getting water into her ear during showers.   PERTINENT  PMH / PSH: reviewed and updated as appropriate   OBJECTIVE:   BP (!) 197/105   Pulse (!) 51   Ht 5\' 9"  (1.753 m)   Wt (!) 318 lb 12.8 oz (144.6 kg)   LMP 03/11/2021   SpO2 100%   BMI 47.08 kg/m    GEN: pleasant female, in no acute distress  HENT: right ear yellowish-green discharge in the external ear canal CV: regular rate and rhythm, no murmurs appreciated  RESP: no increased work of breathing, clear to ascultation bilaterally MSK: no LE edema SKIN: warm, dry, no rash on visible skin NERUO: Alert, oriented, cranial nerves II through XII grossly intact    ASSESSMENT/PLAN:   Otitis externa Exam consistent with right-sided otitis externa.  Treat with Ciprodex.  She was treated a few months ago for the same.  Reviewed prevention techniques.  If occurs again we will would consider referral to ENT.  Hypertension Uncontrolled.  History of renal artery stenosis.  Taking 10 mg amlodipine, irbesartan 150 mg, Coreg 6.5 mg, Lasix 60 mg daily, hydralazine 10 mg 3 times daily.  Unable to increase  Coreg due to bradycardia.  Could possibly increase irbesartan if needed.  Unsure if patient takes her medication regularly as I have received notification that all of her medications were not filled, per pharmacy.  Discussed importance of adherence. -Advised to reach back out to cardiology to ensure her stents are patent -Follow-up with nephrology soon -BMP today  Migraine without aura Uncontrolled.  Follows with nephrology.  Tylenol as needed, Benadryl and Compazine as needed.  This is not working as well as it did before.  This may be making her blood pressure higher -Call neurology for follow-up     [name], DO PGY-3, [facility] 04/09/2021

## 2021-04-10 LAB — BASIC METABOLIC PANEL
BUN/Creatinine Ratio: 8 — ABNORMAL LOW (ref 9–23)
BUN: 18 mg/dL (ref 6–24)
CO2: 23 mmol/L (ref 20–29)
Calcium: 9.5 mg/dL (ref 8.7–10.2)
Chloride: 102 mmol/L (ref 96–106)
Creatinine, Ser: 2.22 mg/dL — ABNORMAL HIGH (ref 0.57–1.00)
Glucose: 86 mg/dL (ref 70–99)
Potassium: 4.4 mmol/L (ref 3.5–5.2)
Sodium: 143 mmol/L (ref 134–144)
eGFR: 26 mL/min/{1.73_m2} — ABNORMAL LOW (ref >59)

## 2021-04-12 DIAGNOSIS — H6090 Unspecified otitis externa, unspecified ear: Secondary | ICD-10-CM | POA: Insufficient documentation

## 2021-04-12 MED ORDER — AMLODIPINE BESYLATE 10 MG PO TABS: 10.0000 mg | ORAL_TABLET | Freq: Every day | ORAL | Status: DC

## 2021-04-12 NOTE — Assessment & Plan Note (Signed)
Exam consistent with right-sided otitis externa.  Treat with Ciprodex.  She was treated a few months ago for the same.  Reviewed prevention techniques.  If occurs again we will would consider referral to ENT.

## 2021-04-12 NOTE — Assessment & Plan Note (Signed)
Uncontrolled.  Follows with nephrology.  Tylenol as needed, Benadryl and Compazine as needed.  This is not working as well as it did before.  This may be making her blood pressure higher -Call neurology for follow-up

## 2021-04-12 NOTE — Assessment & Plan Note (Addendum)
Uncontrolled.  History of renal artery stenosis.  Taking 10 mg amlodipine, irbesartan 150 mg, Coreg 6.5 mg, Lasix 60 mg daily, hydralazine 10 mg 3 times daily.  Unable to increase Coreg due to bradycardia.  Could possibly increase irbesartan if needed.  Unsure if patient takes her medication regularly as I have received notification that all of her medications were not filled, per pharmacy.  Discussed importance of adherence. -Advised to reach back out to cardiology to ensure her stents are patent -Follow-up with nephrology soon -BMP today

## 2021-04-15 ENCOUNTER — Telehealth: Payer: Self-pay

## 2021-04-15 NOTE — Telephone Encounter (Signed)
New message - website CoverMyMeds   Your information has been submitted to [place]. [employer] [place] will review the request and notify you of the determination decision directly, typically within 72 hours of receiving all information.  You will also receive your request decision electronically. To check for an update later, open this request again from your dashboard.  If [employer] [place] has not responded within the specified timeframe or if you have any questions about your PA submission, contact [place]  directly at [phone].   [name] KeyAlba Cory - Rx #: [id number] Need help? Call us at [phone] Status Sent to [employer] Emgality 120MG /ML auto-injectors (migraine) Form [employer] Form (CB) Original Claim Info [id number]

## 2021-04-16 ENCOUNTER — Emergency Department (HOSPITAL_COMMUNITY)
Admission: EM | Admit: 2021-04-16 | Discharge: 2021-04-16 | Disposition: A | Discharge status: Home / Self Care | Payer: BC Managed Care – PPO | Attending: Emergency Medicine | Admitting: Emergency Medicine

## 2021-04-16 ENCOUNTER — Emergency Department (HOSPITAL_COMMUNITY): Payer: BC Managed Care – PPO

## 2021-04-16 ENCOUNTER — Encounter (HOSPITAL_COMMUNITY): Payer: Self-pay | Admitting: Emergency Medicine

## 2021-04-16 ENCOUNTER — Other Ambulatory Visit: Payer: Self-pay

## 2021-04-16 DIAGNOSIS — Z5321 Procedure and treatment not carried out due to patient leaving prior to being seen by health care provider: Secondary | ICD-10-CM | POA: Diagnosis not present

## 2021-04-16 DIAGNOSIS — R079 Chest pain, unspecified: Secondary | ICD-10-CM | POA: Diagnosis not present

## 2021-04-16 DIAGNOSIS — R0600 Dyspnea, unspecified: Secondary | ICD-10-CM | POA: Insufficient documentation

## 2021-04-16 DIAGNOSIS — R0789 Other chest pain: Secondary | ICD-10-CM | POA: Diagnosis not present

## 2021-04-16 DIAGNOSIS — R110 Nausea: Secondary | ICD-10-CM | POA: Insufficient documentation

## 2021-04-16 LAB — COMPREHENSIVE METABOLIC PANEL
ALT: 20 U/L (ref 0–44)
AST: 20 U/L (ref 15–41)
Albumin: 3.4 g/dL — ABNORMAL LOW (ref 3.5–5.0)
Alkaline Phosphatase: 60 U/L (ref 38–126)
Anion gap: 7 (ref 5–15)
BUN: 20 mg/dL (ref 6–20)
CO2: 27 mmol/L (ref 22–32)
Calcium: 9 mg/dL (ref 8.9–10.3)
Chloride: 105 mmol/L (ref 98–111)
Creatinine, Ser: 2.46 mg/dL — ABNORMAL HIGH (ref 0.44–1.00)
GFR, Estimated: 23 mL/min — ABNORMAL LOW (ref >60)
Glucose, Bld: 150 mg/dL — ABNORMAL HIGH (ref 70–99)
Potassium: 3.6 mmol/L (ref 3.5–5.1)
Sodium: 139 mmol/L (ref 135–145)
Total Bilirubin: 0.6 mg/dL (ref 0.3–1.2)
Total Protein: 6.2 g/dL — ABNORMAL LOW (ref 6.5–8.1)

## 2021-04-16 LAB — CBC
HCT: 42.2 % (ref 36.0–46.0)
Hemoglobin: 13.6 g/dL (ref 12.0–15.0)
MCH: 27.6 pg (ref 26.0–34.0)
MCHC: 32.2 g/dL (ref 30.0–36.0)
MCV: 85.6 fL (ref 80.0–100.0)
Platelets: 218 10*3/uL (ref 150–400)
RBC: 4.93 MIL/uL (ref 3.87–5.11)
RDW: 13 % (ref 11.5–15.5)
WBC: 6.4 10*3/uL (ref 4.0–10.5)
nRBC: 0 % (ref 0.0–0.2)

## 2021-04-16 LAB — TROPONIN I (HIGH SENSITIVITY)
Troponin I (High Sensitivity): 9 ng/L (ref <18)
Troponin I (High Sensitivity): 8 ng/L (ref <18)

## 2021-04-16 LAB — I-STAT BETA HCG BLOOD, ED (MC, WL, AP ONLY): I-stat hCG, quantitative: <5 m[IU]/mL (ref <5)

## 2021-04-16 NOTE — ED Provider Notes (Signed)
Emergency Medicine Provider Triage Evaluation Note  Heidi Rivera , a 55 y.o. female  was evaluated in triage.  Pt complains of chest pain that began 1 hour PTA. Left chest, associated dyspnea, no specific alleviating/aggravating factors, had nausea earlier tonight but none with chest  pain. Problems with high BP x 1 week, taking meds as prescribed.  Review of Systems  Positive: Chest pain, dyspnea, nausea Negative: Vomiting, diaphoresis, syncope, leg pain/swelling.   Physical Exam  BP (!) 176/92   Pulse (!) 56   Temp 98 F (36.7 C) (Oral)   Resp 18   Ht 5\' 9"  (1.753 m)   Wt (!) 158 kg   LMP 03/17/2021   SpO2 99%   BMI 51.44 kg/m  Gen:   Awake, no distress   Resp:  Normal effort  MSK:   Moves extremities without difficulty   Medical Decision Making  Medically screening exam initiated at 12:58 AM.  Appropriate orders placed.  Heidi Rivera was informed that the remainder of the evaluation will be completed by another provider, this initial triage assessment does not replace that evaluation, and the importance of remaining in the ED until their evaluation is complete.  Chest pain   [name], PA-C 04/16/21 0100    [name], MD 04/16/21 0110

## 2021-04-16 NOTE — ED Triage Notes (Addendum)
Patient reports central chest pain with SOB this evening , mld nausea , no emesis or diaphoresis . Hypertensive at triage.

## 2021-04-16 NOTE — ED Notes (Signed)
Pt decided to leave while waiting for a room.  

## 2021-04-17 ENCOUNTER — Telehealth: Payer: Self-pay | Admitting: Cardiovascular Disease

## 2021-04-17 NOTE — Telephone Encounter (Signed)
Pt c/o BP issue: STAT if pt c/o blurred vision, one-sided weakness or slurred speech  1. What are your last 5 BP readings?   211/148 180/100  2. Are you having any other symptoms (ex. Dizziness, headache, blurred vision, passed out)?  Migraines   3. What is your BP issue?   Patient states her BP has been extremely elevated. She is requesting an order for Renal testing.

## 2021-04-17 NOTE — Telephone Encounter (Signed)
Unable to reach pt or leave a message  

## 2021-04-22 ENCOUNTER — Telehealth: Payer: Self-pay | Admitting: Neurology

## 2021-04-22 NOTE — Telephone Encounter (Signed)
Pt said she needs some type of medication to relieve her from migraines. Said she almost ended up in the ER this weekend.

## 2021-04-23 NOTE — Telephone Encounter (Signed)
F/u  Heidi Rivera (Key: [id number]) Rx #: [id number] Emgality 120MG /ML auto-injectors (migraine)   Form [employer] [place] [employer] Form (CB) Created 17 days ago Sent to Plan 8 days ago Plan Response 8 days ago Submit Clinical Questions 8 days ago Determination Favorable 8 days ago Message from Plan Effective from 04/15/2021 through 07/07/2021.

## 2021-04-23 NOTE — Telephone Encounter (Signed)
Pt called and informed that she can come in for a head ache cocktail, she has to have a driver, and the driver has to come into the office with her,

## 2021-04-23 NOTE — Telephone Encounter (Signed)
Pt called no answer no voice mail set up, will try to call again

## 2021-04-23 NOTE — Telephone Encounter (Signed)
Pt is returning a call to someone 

## 2021-04-23 NOTE — Telephone Encounter (Signed)
F/u   Heidi Rivera (Key: [id number]) Rx #: [id number] Emgality 120MG /ML auto-injectors (migraine)   Form [employer] [place] [employer] Form (CB) Created 17 days ago Sent to Plan 8 days ago Plan Response 8 days ago Submit Clinical Questions 8 days ago Determination Favorable 8 days ago Message from Plan Effective from 04/15/2021 through 07/07/2021.

## 2021-04-24 NOTE — Telephone Encounter (Signed)
Called patient, she states she has been having higher blood pressure readings. She went to the ED for it at the beginning of November, and has continued to have issues.  Her average Bp readings at home even after the ED visit has been 180's/100's. She states she has been having migraines and they are trying to control them, but is unsure if the BP's are causing them, or if the migraines are causing the increased BP's. Neurology is trying to get these under control. She states she missed her renal artery [place] appointment, and would like to discuss having this completed again. Patient on many medications already. I did suggest we should have her be seen to evaluate her BP here in the office- made an appointment for Friday with Jory Sims, NP at 2:45 PM. Patient very thankful for appointment.   Will route to NP just a FYI for appointment.  Thanks!

## 2021-04-25 NOTE — Progress Notes (Deleted)
Cardiology Office Note   Date:  04/25/2021   ID:  Heidi Rivera, DOB 05-04-1966, MRN [id number]  PCP:  [name], DO  Cardiologist:  Dr. Estanislado Emms, [facility], [place], [facility] No chief complaint on file.    History of Present Illness: Heidi Rivera is a 55 y.o. female who presents for ongoing assessment and management of presumed renovascular hypertension with long history of hypertension, on 3 antihypertensive medications with resistant hypertension levels of 200/100 ranges at home.  Dr. Gwenlyn Found noted that she did have an MRI angiogram performed on 09/10/2019 that suggested bilateral renal artery stenosis.  Dr. Gwenlyn Found performed an abdominal aortography and selective renal artery angiography on 04/26/2020 revealing a 60 to 70% ostial left renal artery stenosis with a 20 to 30 mm pullback gradient.  Dr. Gwenlyn Found performed PTA and stenting using a 6 mm x 15 mm long Herculink balloon expandable stent acquiring an excellent result.  Dr. Alvester Chou ordered a follow-up Doppler study which was performed on 05/09/2020 which did reveal widely patent left renal artery stents.    On follow-up appointment on 06/05/2020 blood pressures had been under much better control on fewer medications.  Blood pressure on last office visit was 136/84.  Dr. Alvester Chou discontinued losartan and decrease amlodipine from 10 mg daily to 5 mg daily.  She is to have annual renal Doppler studies and was to return as needed.    Past Medical History:  Diagnosis Date   Acute gastritis    CHRONIC KIDNEY DISEASE STAGE III (MODERATE) 07/11/2008   Qualifier: Diagnosis of  By: Tye Savoy MD, [place]     Chronic left-sided low back pain with left-sided sciatica 07/23/2006   Qualifier: Diagnosis of  By: Herma Ard     Costochondritis, acute 06/17/2019   Essential hypertension, benign 08/30/2007   Qualifier: Diagnosis of  By: Darylene Price MD, Mark     Gout    Hypertension    Laryngitis 10/13/2019   Migraine without aura 07/11/2008    Qualifier: Diagnosis of  By: Carlena Sax  MD, Stephanie     Migraines    Obesity    OBESITY, NOS 07/23/2006   Qualifier: Diagnosis of  By: Herma Ard     Stress incontinence 06/17/2019   Vertigo     Past Surgical History:  Procedure Laterality Date   HEMORRHOID SURGERY     PERIPHERAL VASCULAR INTERVENTION  04/26/2020   Procedure: PERIPHERAL VASCULAR INTERVENTION;  Surgeon: [name], MD;  Location: [facility];  Service: Cardiovascular;;  left renal   RENAL ANGIOGRAPHY Right 04/26/2020   Procedure: RENAL ANGIOGRAPHY;  Surgeon: [name], MD;  Location: [facility];  Service: Cardiovascular;  Laterality: Right;     Current Outpatient Medications  Medication Sig Dispense Refill   acetaminophen (TYLENOL) 500 MG tablet Take 2 tablets (1,000 mg total) by mouth every 8 (eight) hours as needed. 30 tablet 0   albuterol (VENTOLIN HFA) 108 (90 Base) MCG/ACT inhaler Inhale 2 puffs into the lungs every 6 (six) hours as needed for wheezing or shortness of breath. 1 each 1   amLODipine (NORVASC) 10 MG tablet Take 1 tablet (10 mg total) by mouth at bedtime.     Blood Pressure Monitoring (BLOOD PRESSURE CUFF) MISC 1 Device by Does not apply route daily. 1 each 0   carvedilol (COREG) 6.25 MG tablet Take 1 tablet (6.25 mg total) by mouth 2 (two) times daily. 180 tablet 0   clopidogrel (PLAVIX) 75 MG tablet Take 1 tablet by mouth  once daily 90 tablet 3   cyclobenzaprine (FLEXERIL) 10 MG tablet Take 1 tablet (10 mg total) by mouth 3 (three) times daily as needed for muscle spasms. 20 tablet 0   diphenhydrAMINE (BENADRYL ALLERGY) 25 mg capsule Take 1 capsule (25 mg total) by mouth every 6 (six) hours as needed. 30 capsule 0   empagliflozin (JARDIANCE) 10 MG TABS tablet Take 1 tablet (10 mg total) by mouth daily. 30 tablet 0   furosemide (LASIX) 40 MG tablet Take 1.5 tablets (60 mg total) by mouth daily. 135 tablet 2   Galcanezumab-gnlm (EMGALITY) 120 MG/ML SOAJ Inject 120 mg into  the skin every 28 (twenty-eight) days. 1.12 mL 5   hydrALAZINE (APRESOLINE) 10 MG tablet Take 1 tablet (10 mg total) by mouth 3 (three) times daily. 90 tablet 1   irbesartan (AVAPRO) 150 MG tablet Take 1 tablet (150 mg total) by mouth daily. 30 tablet 1   ketoconazole (NIZORAL) 2 % cream Apply 1 application topically daily. For 14 days 15 g 0   meclizine (ANTIVERT) 12.5 MG tablet Take 1 tablet (12.5 mg total) by mouth 3 (three) times daily as needed for dizziness or nausea. 30 tablet 0   oxybutynin (DITROPAN) 5 MG tablet Take 1 tablet (5 mg total) by mouth 2 (two) times daily. 30 tablet 2   pravastatin (PRAVACHOL) 40 MG tablet Take 1 tablet (40 mg total) by mouth at bedtime. 90 tablet 3   prochlorperazine (COMPAZINE) 10 MG tablet Take 1 tablet (10 mg total) by mouth every 6 (six) hours as needed for nausea or vomiting. 30 tablet 0   rizatriptan (MAXALT-MLT) 10 MG disintegrating tablet Take 1 tablet at earliest onset of migraine. May repeat in 2 hours if needed. Maximum 2 tablets in 24 hours. 10 tablet 5   valproic acid (DEPAKENE) 250 MG capsule Take 2 capsules (500 mg total) by mouth once for 1 dose. 2 capsule 0   Vitamin D, Ergocalciferol, (DRISDOL) 1.25 MG (50000 UNIT) CAPS capsule Take 50,000 Units by mouth every Monday.      No current facility-administered medications for this visit.    Allergies:   Tramadol    Social History:  The patient  reports that she has never smoked. She has never used smokeless tobacco. She reports current alcohol use. She reports that she does not use drugs.   Family History:  The patient's family history includes Breast cancer in her mother; Cancer - Other in her paternal grandmother; Colon cancer in her maternal uncle; Diabetes in her maternal aunt and mother; Hypercholesterolemia in her father; Hypertension in her father; Other in her father and mother.    ROS: All other systems are reviewed and negative. Unless otherwise mentioned in H&P    PHYSICAL  EXAM: VS:  There were no vitals taken for this visit. , BMI There is no height or weight on file to calculate BMI. GEN: Well nourished, well developed, in no acute distress HEENT: normal Neck: no JVD, carotid bruits, or masses Cardiac: ***RRR; no murmurs, rubs, or gallops,no edema  Respiratory:  Clear to auscultation bilaterally, normal work of breathing GI: soft, nontender, nondistended, + BS MS: no deformity or atrophy Skin: warm and dry, no rash Neuro:  Strength and sensation are intact Psych: euthymic mood, full affect   EKG:  EKG {ACTION; IS/IS [id number]} ordered today. The ekg ordered today demonstrates ***   Recent Labs: 08/20/2020: BNP 24.0 04/16/2021: ALT 20; BUN 20; Creatinine, Ser 2.46; Hemoglobin 13.6; Platelets 218; Potassium 3.6; Sodium 139  Lipid Panel    Component Value Date/Time   CHOL 216 (H) 07/23/2020 1620   TRIG 186 (H) 07/23/2020 1620   HDL 56 07/23/2020 1620   CHOLHDL 3.9 07/23/2020 1620   CHOLHDL 3.7 Ratio 09/02/2007 1339   VLDL 18 09/02/2007 1339   LDLCALC 127 (H) 07/23/2020 1620      Wt Readings from Last 3 Encounters:  04/16/21 (!) 348 lb 5.2 oz (158 kg)  04/09/21 (!) 318 lb 12.8 oz (144.6 kg)  03/11/21 (!) 320 lb (145.2 kg)      Other studies Reviewed: Renal: Doppler Ultrasound 05/09/2020    Right: Normal size right kidney. Normal right Resistive Index.         Normal cortical thickness of right kidney. No evidence of         right renal artery stenosis. RRV flow present.  Left:  Normal size of left kidney. Normal left Resistive Index.         Normal cortical thickness of the left kidney. No evidence of         left renal artery stenosis. LRV flow present. Stent struts         not visualized. Renal artery widely patent without evidence         of stenosis, s/p stent placement. Marked improvement compared         to the prior exam.  Mesenteric:  Normal Celiac artery and Superior Mesenteric artery findings.   Renal Artery  Angiogram 04/26/2020 Angiographic Data:    1: Abdominal aorta-widely patent with no atherosclerosis noted 2: Right renal artery-widely patent 3: Left renal artery-60 to 70% proximal left renal artery stenosis with a 20 to 30 mm pullback gradient   IMPRESSION: Heidi Rivera has a significant proximal left renal artery stenosis.  This may be contributing to her resistant hypertension on 3 antihypertensive medications.  We will proceed with PTA and stenting.  Procedures Performed:               1.  Ultrasound-guided right common femoral access               2.  CO2 abdominal aortogram               3.  Selective right and left renal artery angiogram               4.  PTA and stenting proximal left renal artery stenosis               5.  Mynx closure right common femoral artery  Echocardiogram 04/03/2020 1. Left ventricular ejection fraction, by estimation, is 65 to 70%. The  left ventricle has normal function. The left ventricle has no regional  wall motion abnormalities. There is moderate concentric left ventricular  hypertrophy. Left ventricular  diastolic parameters are consistent with Grade I diastolic dysfunction  (impaired relaxation). Elevated left atrial pressure. The average left  ventricular global longitudinal strain is -18.1 %. The global longitudinal  strain is normal.   2. Right ventricular systolic function is normal. The right ventricular  size is normal. There is normal pulmonary artery systolic pressure.   3. The mitral valve is normal in structure. Trivial mitral valve  regurgitation. No evidence of mitral stenosis.   4. The aortic valve is normal in structure. Aortic valve regurgitation is  not visualized. No aortic stenosis is present.   5. The inferior vena cava is normal in size with greater than 50%  respiratory variability, suggesting right atrial  pressure of 3 mmHg.  ASSESSMENT AND PLAN:  1.  ***   Current medicines are reviewed at length with the patient  today.  I have spent *** dedicated to the care of this patient on the date of this encounter to include pre-visit review of records, assessment, management and diagnostic testing,with shared decision making.  Labs/ tests ordered today include: *** Phill Myron. West Pugh, ANP, AACC   04/25/2021 3:19 PM    [facility] [address] Office [phone] Fax [phone]  Notice: This dictation was prepared with Dragon dictation along with smaller phrase technology. Any transcriptional errors that result from this process are unintentional and may not be corrected upon review.

## 2021-04-25 NOTE — Progress Notes (Deleted)
Cardiology Clinic Note   Patient Name: Heidi Rivera Date of Encounter: 04/26/2021  Primary Care Provider:  [name], DO Primary Cardiologist:  Freada Bergeron, MD  Patient Profile    55 year old female with a history of presumed renovascular hypertension, on 3 antihypertensive medications with resistant hypertension levels of 200/100 ranges at home, hyperlipidemia, CKD, gout, and migraines who presents for follow-up related to hypertension.  Past Medical History    Past Medical History:  Diagnosis Date   Acute gastritis    CHRONIC KIDNEY DISEASE STAGE III (MODERATE) 07/11/2008   Qualifier: Diagnosis of  By: Tye Savoy MD, [place]     Chronic left-sided low back pain with left-sided sciatica 07/23/2006   Qualifier: Diagnosis of  By: Herma Ard     Costochondritis, acute 06/17/2019   Essential hypertension, benign 08/30/2007   Qualifier: Diagnosis of  By: Darylene Price MD, Mark     Gout    Hypertension    Laryngitis 10/13/2019   Migraine without aura 07/11/2008   Qualifier: Diagnosis of  By: Carlena Sax  MD, Stephanie     Migraines    Obesity    OBESITY, NOS 07/23/2006   Qualifier: Diagnosis of  By: Herma Ard     Stress incontinence 06/17/2019   Vertigo    Past Surgical History:  Procedure Laterality Date   HEMORRHOID SURGERY     PERIPHERAL VASCULAR INTERVENTION  04/26/2020   Procedure: PERIPHERAL VASCULAR INTERVENTION;  Surgeon: [name], MD;  Location: [facility];  Service: Cardiovascular;;  left renal   RENAL ANGIOGRAPHY Right 04/26/2020   Procedure: RENAL ANGIOGRAPHY;  Surgeon: [name], MD;  Location: [facility];  Service: Cardiovascular;  Laterality: Right;    Allergies  Allergies  Allergen Reactions   Tramadol Other (See Comments)    Intolerance : GI Upset    History of Present Illness    55 year old female with a past medical history including presumed renovascular hypertension, on 3 antihypertensive  medications with resistant hypertension levels of 200/100 ranges at home, hyperlipidemia, CKD, gout, and migraines.  She established care with our office in October 2021.  She was referred by nephrology for chest pain, dyspnea on exertion and resistant hypertension. Her chest pain was thought to be atypical with no exertional symptoms. Symptoms were more consistent with GERD and she was started on Nexium.  She also reported dyspnea on exertion in the setting of deconditioning and low activity.  Echocardiogram showed an EF of 65 to 70%, no regional motion abnormalities, grade 1 diastolic dysfunction, elevated left atrial pressure.  Additionally, with suspected OSA, she was referred for sleep study. With regards to her blood pressure, she had evidence of renal artery stenosis on prior MRI in 04/2020. It was recommended she follow-up with Dr. Alvester Chou for resistant hypertension and RAS.  Abdominal aortography and selective renal artery angiography on 04/26/2020 revealed 60 to 70% ostial left renal artery stenosis.  She underwent PTA and stenting of the left renal artery.  Follow-up Doppler performed on 05/09/2020 revealed patent left renal artery stent.  At her follow-up appointment on June 05, 2020 she was doing well overall.  Her blood pressures were better controlled on fewer medications. Her losartan was discontinued in the setting of hypotension amlodipine was decreased from 10 mg to 5 mg daily.  Annual renal Doppler studies were recommended.    Over the past year, she began having increasing elevated blood pressures, followed by her PCP. She was started on irbesartan 150 mg daily  and hydralazine 10 mg daily.  Primary care expressed concerns over medication regimen.  She called our office on 04/17/2021 following a trip to the emergency room with concern for elevated blood pressure readings of 211/148 and 180/100.  She left the emergency room before being treated she reported associated migraines (follows with  neurology) and requested an appointment to discuss repeat renal testing.     Resistant hypertension  Renal artery stenosis S/p PTA and stenting of the left renal artery 04/2020. On statin, Plavix.  Now with recurrent hypertension.  She is due for repeat renal testing.  Will order renal ultrasound.  CKD Recent creatinine 2.22, BUN 18, GFR 26.  Follows with nephrology.  Hyperlipidemia  Lipid panel February 2022 showed LDL 127, HDL 56, triglycerides 26 and total cholesterol 216.  Managed by PCP. Continue pravastatin 40 mg daily. Change to atrovastatin 80, repeat lipids LFTS in 1 month.  Migraines  Who to manage? PCP vs cardiology.  Carotid?  BP both sides HTN clininc Taking meds?  Amlodipine 10 mg daily at bedtime Hydralazine 10 mg 3 times daily Carvedilol 6.25 mg twice daily, not escalate in the setting of bradycardia. Plavix 75 mg daily Lasix 60 mg daily Irbesartan 150 mg daily Pravastatin 40 mg daily    Home Medications    Current Outpatient Medications  Medication Sig Dispense Refill   acetaminophen (TYLENOL) 500 MG tablet Take 2 tablets (1,000 mg total) by mouth every 8 (eight) hours as needed. 30 tablet 0   albuterol (VENTOLIN HFA) 108 (90 Base) MCG/ACT inhaler Inhale 2 puffs into the lungs every 6 (six) hours as needed for wheezing or shortness of breath. 1 each 1   amLODipine (NORVASC) 10 MG tablet Take 1 tablet (10 mg total) by mouth at bedtime.     Blood Pressure Monitoring (BLOOD PRESSURE CUFF) MISC 1 Device by Does not apply route daily. 1 each 0   carvedilol (COREG) 6.25 MG tablet Take 1 tablet (6.25 mg total) by mouth 2 (two) times daily. 180 tablet 0   clopidogrel (PLAVIX) 75 MG tablet Take 1 tablet by mouth once daily 90 tablet 3   cyclobenzaprine (FLEXERIL) 10 MG tablet Take 1 tablet (10 mg total) by mouth 3 (three) times daily as needed for muscle spasms. 20 tablet 0   diphenhydrAMINE (BENADRYL ALLERGY) 25 mg capsule Take 1 capsule (25 mg total) by mouth every  6 (six) hours as needed. 30 capsule 0   empagliflozin (JARDIANCE) 10 MG TABS tablet Take 1 tablet (10 mg total) by mouth daily. 30 tablet 0   furosemide (LASIX) 40 MG tablet Take 1.5 tablets (60 mg total) by mouth daily. 135 tablet 2   Galcanezumab-gnlm (EMGALITY) 120 MG/ML SOAJ Inject 120 mg into the skin every 28 (twenty-eight) days. 1.12 mL 5   hydrALAZINE (APRESOLINE) 10 MG tablet Take 1 tablet (10 mg total) by mouth 3 (three) times daily. 90 tablet 1   irbesartan (AVAPRO) 150 MG tablet Take 1 tablet (150 mg total) by mouth daily. 30 tablet 1   ketoconazole (NIZORAL) 2 % cream Apply 1 application topically daily. For 14 days 15 g 0   meclizine (ANTIVERT) 12.5 MG tablet Take 1 tablet (12.5 mg total) by mouth 3 (three) times daily as needed for dizziness or nausea. 30 tablet 0   oxybutynin (DITROPAN) 5 MG tablet Take 1 tablet (5 mg total) by mouth 2 (two) times daily. 30 tablet 2   pravastatin (PRAVACHOL) 40 MG tablet Take 1 tablet (40 mg total) by mouth  at bedtime. 90 tablet 3   prochlorperazine (COMPAZINE) 10 MG tablet Take 1 tablet (10 mg total) by mouth every 6 (six) hours as needed for nausea or vomiting. 30 tablet 0   rizatriptan (MAXALT-MLT) 10 MG disintegrating tablet Take 1 tablet at earliest onset of migraine. May repeat in 2 hours if needed. Maximum 2 tablets in 24 hours. 10 tablet 5   valproic acid (DEPAKENE) 250 MG capsule Take 2 capsules (500 mg total) by mouth once for 1 dose. 2 capsule 0   Vitamin D, Ergocalciferol, (DRISDOL) 1.25 MG (50000 UNIT) CAPS capsule Take 50,000 Units by mouth every Monday.      No current facility-administered medications for this visit.     Family History    Family History  Problem Relation Age of Onset   Diabetes Mother    Breast cancer Mother    Other Mother        Larynx cancer   Diabetes Maternal Aunt    Hypertension Father    Hypercholesterolemia Father    Other Father        DDD   Cancer - Other Paternal Grandmother        bone    Colon cancer Maternal Uncle    She indicated that her mother is alive. She indicated that her father is alive. She indicated that her paternal grandmother is deceased. She indicated that the status of her maternal aunt is unknown. She indicated that the status of her maternal uncle is unknown.  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   Tobacco Use   Smoking status: Never   Smokeless tobacco: Never  Vaping Use   Vaping Use: Never used  Substance and Sexual Activity   Alcohol use: Yes    Comment: rare   Drug use: No   Sexual activity: Not on file  Other Topics Concern   Not on file  Social History Narrative   Lives with sister and her husband who smokes    Social Determinants of Health   Financial Resource Strain: Not on file  Food Insecurity: Not on file  Transportation Needs: Not on file  Physical Activity: Not on file  Stress: Not on file  Social Connections: Not on file  Intimate Partner Violence: Not on file     Review of Systems    General:  No chills, fever, night sweats or weight changes.  Cardiovascular:  No chest pain, dyspnea on exertion, edema, orthopnea, palpitations, paroxysmal nocturnal dyspnea. Dermatological: No rash, lesions/masses Respiratory: No cough, dyspnea Urologic: No hematuria, dysuria Abdominal: No nausea, vomiting, diarrhea, bright red blood per rectum, melena, or hematemesis Neurologic: No visual changes, wkns, changes in mental status. All other systems reviewed and are otherwise negative except as noted above.     Physical Exam    VS:  There were no vitals taken for this visit. , BMI There is no height or weight on file to calculate BMI.     GEN: Well nourished, well developed, in no acute distress. HEENT:  normal. Neck: Supple, no JVD, carotid bruits, or masses. Cardiac: RRR, no murmurs, rubs, or gallops. No clubbing, cyanosis, edema.  Radials/DP/PT 2+ and equal bilaterally.  Respiratory:  Respirations regular and unlabored, clear to  auscultation bilaterally. GI: Soft, nontender, nondistended, BS + x 4. MS: no deformity or atrophy. Skin: warm and dry, no rash. Neuro:  Strength and sensation are intact. Psych: Normal affect.  Accessory Clinical Findings    ECG personally reviewed by me today- *** - No acute changes  Lab Results  Component Value Date   WBC 6.4 04/16/2021   HGB 13.6 04/16/2021   HCT 42.2 04/16/2021   MCV 85.6 04/16/2021   PLT 218 04/16/2021   Lab Results  Component Value Date   CREATININE 2.46 (H) 04/16/2021   BUN 20 04/16/2021   NA 139 04/16/2021   K 3.6 04/16/2021   CL 105 04/16/2021   CO2 27 04/16/2021   Lab Results  Component Value Date   ALT 20 04/16/2021   AST 20 04/16/2021   ALKPHOS 60 04/16/2021   BILITOT 0.6 04/16/2021   Lab Results  Component Value Date   CHOL 216 (H) 07/23/2020   HDL 56 07/23/2020   LDLCALC 127 (H) 07/23/2020   TRIG 186 (H) 07/23/2020   CHOLHDL 3.9 07/23/2020    Lab Results  Component Value Date   HGBA1C 6.7 (H) 02/22/2021    Assessment & Plan   1.  ***  Lenna Sciara, NP 04/26/2021, 3:28 PM

## 2021-04-26 ENCOUNTER — Ambulatory Visit: Payer: BC Managed Care – PPO | Admitting: Adult Health

## 2021-05-18 ENCOUNTER — Other Ambulatory Visit: Payer: Self-pay | Admitting: Family Medicine

## 2021-05-18 DIAGNOSIS — M5442 Lumbago with sciatica, left side: Secondary | ICD-10-CM

## 2021-05-18 DIAGNOSIS — G8929 Other chronic pain: Secondary | ICD-10-CM

## 2021-06-18 ENCOUNTER — Other Ambulatory Visit: Payer: Self-pay

## 2021-06-18 ENCOUNTER — Ambulatory Visit (INDEPENDENT_AMBULATORY_CARE_PROVIDER_SITE_OTHER): Payer: No Typology Code available for payment source | Admitting: Family Medicine

## 2021-06-18 ENCOUNTER — Telehealth: Payer: Self-pay

## 2021-06-18 VITALS — BP 176/110 | HR 72 | Ht 69.0 in | Wt 309.0 lb

## 2021-06-18 DIAGNOSIS — R051 Acute cough: Secondary | ICD-10-CM | POA: Diagnosis not present

## 2021-06-18 MED ORDER — ALBUTEROL SULFATE HFA 108 (90 BASE) MCG/ACT IN AERS: 2.0000 | INHALATION_SPRAY | Freq: Four times a day (QID) | RESPIRATORY_TRACT | 1 refills | Status: DC | PRN

## 2021-06-18 NOTE — Progress Notes (Signed)
° ° °  SUBJECTIVE:   CHIEF COMPLAINT / HPI:   Cough/congestion/wheezing: Patient with mild congestion and nausea as well as cough since last week.  She denies fevers, chills, body aches.  She has had some nausea, particularly right after eating. She states she took a COVID test on Friday which was negative. She is experiencing some dyspnea with daily activity which started in the last day or two. No chest pain. Never smoker.  Hypertension: She only has had one of her BP meds today. She states she saw her nephrologist who is adjusting some of her medications due to feeling unwell on them. She is taking hydralazine 25mg  TID, she is holding the other ones currently. She states she was told by nephrology to hold the coreg and irbesartan as she increased the dose of her hydralazine. She follows up with them tomorrow.  PERTINENT  PMH / PSH: History of hypertension.  OBJECTIVE:   BP (!) 176/110    Pulse 72    Ht 5\' 9"  (1.753 m)    Wt (!) 309 lb (140.2 kg)    LMP 05/14/2021    SpO2 100%    BMI 45.63 kg/m    General: NAD, pleasant, able to participate in exam HEENT: No pharyngeal erythema Cardiac: RRR, no murmurs. Respiratory: Some wheezing present on expiration, no crackles or rhonchi noted, no respiratory distress on room air. Skin: warm and dry, no rashes noted  ASSESSMENT/PLAN:   Cough/congestion/wheezing:  Assessment: 56 y.o. female with cough, congestion, wheezing, dyspnea present for the past few days.  She took a COVID test at home that was negative.  On physical exam she has no edema of her lower extremities, no crackles on lung exam but she is with some wheezes.  Her vitals are appropriate other than elevated blood pressure, see below.  No physical exam findings suggestive of the need of an x-ray at this time.  We will test for COVID/flu/RSV.  Will prescribe albuterol for her to take for the next few days with the likelihood that this is a viral infection.  She is a never smoker.  Low  concern for heart failure with her weight being improved from previous checks and no edema on physical exam or crackles on lung exam.  Low concern for pulmonary embolism as a cause based off her vitals and overall presentation.  Discussed return precautions/ED precautions.  Hypertension Blood pressure elevated 176/110 today.  She recently increased hydralazine to 25 mg 3 times daily via nephrology.  She is seeing them again tomorrow.  She was told to hold her Coreg and her irbesartan when this occurred but she also held her amlodipine.  I recommend that she restart that today.  Recommend she keep her appointment with nephrology tomorrow.  Follow-up with me in about 2 to 3 weeks to make sure she is doing okay from a blood pressure standpoint.  Lurline Del, [place]

## 2021-06-18 NOTE — Patient Instructions (Addendum)
For your respiratory symptoms I am prescribing albuterol.  We are also going to test for COVID/flu/RSV.  If you develop any chest pains, worsening shortness of breath, or other concerning findings on she go to the emergency department..  If you note any swelling of your legs or worsening shortness of breath with activity you need to let us know.  For your blood pressure and she to resume the amlodipine.  Make sure and keep your appointment with nephrology tomorrow.

## 2021-06-18 NOTE — Telephone Encounter (Signed)
Patient calls nurse line reporting wheezing and coughing since last week. Patient reports mild congestion and nausea, however no episodes of vomiting. Patient reports negative Covid test on Friday. Patient reports the symptoms are worse when she lays down. Patient denies fevers/chills or body aches. Patient is requesting albuterol.   Patient scheduled for this afternoon for evaluation.

## 2021-06-19 ENCOUNTER — Other Ambulatory Visit: Payer: Self-pay

## 2021-06-19 ENCOUNTER — Encounter: Payer: Self-pay | Admitting: Family Medicine

## 2021-06-19 ENCOUNTER — Other Ambulatory Visit: Payer: Self-pay | Admitting: Family Medicine

## 2021-06-19 ENCOUNTER — Telehealth: Payer: Self-pay

## 2021-06-19 LAB — COVID-19, FLU A+B AND RSV
Influenza A, NAA: NOT DETECTED
Influenza B, NAA: NOT DETECTED
RSV, NAA: NOT DETECTED
SARS-CoV-2, NAA: NOT DETECTED

## 2021-06-19 MED ORDER — LEVALBUTEROL TARTRATE 45 MCG/ACT IN AERO: 1.0000 | INHALATION_SPRAY | Freq: Four times a day (QID) | RESPIRATORY_TRACT | 12 refills | Status: AC | PRN

## 2021-06-19 NOTE — Telephone Encounter (Signed)
Called patient and informed that new medication was sent to pharmacy. Also informed patient of work letter that was sent to her via Pharmacist, community. Patient appreciative.   Talbot Grumbling, RN

## 2021-06-19 NOTE — Telephone Encounter (Signed)
Patient calls nurse line regarding albuterol inhaler not being covered by insurance.   Called pharmacy. Pharmacist reports that this inhaler is not on insurance's formulary. Preferred agent is Zopenex 45 mcg.   Please advise if this inhaler is an appropriate alternative.   If so, please send to [facility] on [address].   Talbot Grumbling, RN

## 2021-06-25 ENCOUNTER — Telehealth: Payer: Self-pay | Admitting: Cardiovascular Disease

## 2021-06-25 ENCOUNTER — Other Ambulatory Visit: Payer: Self-pay

## 2021-06-25 DIAGNOSIS — I10 Essential (primary) hypertension: Secondary | ICD-10-CM

## 2021-06-25 NOTE — Telephone Encounter (Signed)
Spoke with patient to inform her the renal ultrasound has been ordered and that she will receive a call to schedule it.

## 2021-06-25 NOTE — Telephone Encounter (Signed)
Patient is requesting an order for a renal ultrasound.

## 2021-06-25 NOTE — Telephone Encounter (Signed)
Spoke with patient who is requesting a renal ultrasound per her nephrologist Dr. Carolynne Edouard to check renal stent. Pt. BP is form 160/90 to 200/11. Please advise.

## 2021-06-27 ENCOUNTER — Telehealth: Payer: Self-pay

## 2021-06-27 NOTE — Telephone Encounter (Signed)
New message   Heidi Rivera (Key: [id number]) Emgality 120MG /ML auto-injectors (migraine)   Form Blue Building control surveyor Form (CB) Created 4 minutes ago Sent to Plan 4 minutes ago Plan Response 4 minutes ago Submit Clinical Questions Determination Clinical Questions Are Ready Fill out the questions below and click "Send to Plan." The plan requires answers to the clinical questions for this electronic prior authorization.

## 2021-07-02 NOTE — Telephone Encounter (Signed)
F/u  [name] (Key: [id number]) Emgality 120MG /ML auto-injectors (migraine)   Form Blue Building control surveyor Form (CB) Created 5 days ago Sent to Plan 5 days ago Plan Response 5 days ago Submit Clinical Questions 5 days ago Determination 5 days ago Message from [employer] does not have rx benefits. Provider made aware. Cx case.Heidi Rivera

## 2021-07-12 ENCOUNTER — Ambulatory Visit (HOSPITAL_COMMUNITY)
Admission: RE | Admit: 2021-07-12 | Discharge: 2021-07-12 | Disposition: A | Discharge status: Home / Self Care | Payer: No Typology Code available for payment source | Source: Ambulatory Visit | Attending: Cardiovascular Disease | Admitting: Cardiovascular Disease

## 2021-07-12 ENCOUNTER — Other Ambulatory Visit: Payer: Self-pay

## 2021-07-12 DIAGNOSIS — I10 Essential (primary) hypertension: Secondary | ICD-10-CM

## 2021-08-22 ENCOUNTER — Other Ambulatory Visit (HOSPITAL_COMMUNITY): Payer: Self-pay | Admitting: Cardiovascular Disease

## 2021-08-22 DIAGNOSIS — Z9889 Other specified postprocedural states: Secondary | ICD-10-CM

## 2021-09-06 ENCOUNTER — Ambulatory Visit (INDEPENDENT_AMBULATORY_CARE_PROVIDER_SITE_OTHER): Payer: No Typology Code available for payment source | Admitting: Family Medicine

## 2021-09-06 VITALS — Ht 69.0 in | Wt 307.0 lb

## 2021-09-06 DIAGNOSIS — M79672 Pain in left foot: Secondary | ICD-10-CM

## 2021-09-06 DIAGNOSIS — G629 Polyneuropathy, unspecified: Secondary | ICD-10-CM

## 2021-09-06 DIAGNOSIS — H6091 Unspecified otitis externa, right ear: Secondary | ICD-10-CM

## 2021-09-06 MED ORDER — CIPROFLOXACIN-DEXAMETHASONE 0.3-0.1 % OT SUSP: 4.0000 [drp] | Freq: Two times a day (BID) | OTIC | 0 refills | Status: AC

## 2021-09-06 MED ORDER — GABAPENTIN 100 MG PO CAPS: 100.0000 mg | ORAL_CAPSULE | Freq: Three times a day (TID) | ORAL | 0 refills | Status: DC

## 2021-09-06 NOTE — Progress Notes (Signed)
   SUBJECTIVE:   CHIEF COMPLAINT / HPI:   Chief Complaint  Patient presents with   Ear Pain   Foot burning     Heidi Rivera is a 56 y.o. female here for:    Right ear pain Patient reports another bout of right ear pain that is not getting better.  She does not use Q-tips, headsets or were ear products.  It feels like she is underwater.  Has pain in her right ear and itchiness in both her ears.  Previously was treated with eardrops that helped a lot.  Left foot pain Patient reports burning sensation in her left heel.  This came on all of a sudden in the last week.  It feels like her heel is on fire.  Sensation is intermittent.  Nothing seems to bring it on or take it away.  Denies recent trauma.  Has never had anything like this before.  PERTINENT  PMH / PSH: reviewed and updated as appropriate   OBJECTIVE:   Ht 5\' 9"  (1.753 m)   Wt (!) 307 lb (139.3 kg)   LMP 08/12/2021   BMI 45.34 kg/m    GEN:     alert, cooperative and no distress    HENT:  left TM normal, right canal dry flaky skin, white discharge in the auditory canal  NECK:  normal ROM, no lymphadenopathy  RESP:  clear to auscultation bilaterally, no increased work of breathing  CVS:   regular rate and rhythm MSK:    Right ankle: Inspection: No erythema, edema, ecchymosis or bony deformity, no bone pes planus or cavus deformity, Palpation: Tenderness posterior to medial malleolus extending to medical calcaneal tubercle  ROM: Full active and passive range of motion Strength: 5/5 in all directions No ligamentous laxity No pain at the base of the fifth metatarsal Able to ambulate  without pain  Special Tests: Talar tilt, anterior and posterior drawer negative -Neurovascularly intact, no instability noted Skin:   warm and dry, no rash on visible skin       ASSESSMENT/PLAN:   Otitis externa Etiology is unclear.  She has not been swimming and has not recently washed her hair.  Treat with Ciprodex as  this has provided relief in the past.  As this is her third bout with right-sided otitis externa referral to ENT placed.  Pain of left heel Differential diagnosis neuropathy, Planter fasciitis, tarsal tunnel syndrome.  Exam is unremarkable. Avoid NSAIDs given her CKD stage IV. Trial Gabapentin 100 mg TID PRN.      [name], DO PGY-3, [facility] 09/12/2021

## 2021-09-06 NOTE — Patient Instructions (Addendum)
For your foot: stop by the pharmacy to pick up your medication called Gabapentin. Take 1 tablet up to 3 times a day as needed for pain  ? ?For your ear pain: use the ear drops that were sent to the pharmacy to day.   ? ? ? ? ?

## 2021-09-12 ENCOUNTER — Encounter: Payer: Self-pay | Admitting: Family Medicine

## 2021-09-12 DIAGNOSIS — M79672 Pain in left foot: Secondary | ICD-10-CM | POA: Insufficient documentation

## 2021-09-12 NOTE — Assessment & Plan Note (Signed)
Differential diagnosis neuropathy, Planter fasciitis, tarsal tunnel syndrome.  Exam is unremarkable. Avoid NSAIDs given her CKD stage IV. Trial Gabapentin 100 mg TID PRN.  ?

## 2021-09-12 NOTE — Assessment & Plan Note (Signed)
Etiology is unclear.  She has not been swimming and has not recently washed her hair.  Treat with Ciprodex as this has provided relief in the past.  As this is her third bout with right-sided otitis externa referral to ENT placed. ?

## 2021-09-24 NOTE — Progress Notes (Signed)
? ?NEUROLOGY FOLLOW UP OFFICE NOTE ? ?Heidi Rivera ?[id number] ? ?Assessment/Plan:  ? ?Migraine with aura, without status migrainosus, not intractable - increased - likely related to uncontrolled blood pressure ?Uncontrolled hypertension ?  ?Advised to go to ED to have blood pressure acutely treated ?Migraine prevention:  Start Ajovy ?Migraine rescue:  Will start Ubrelvy 100mg - continue Compazine for nausea ?Due to change in migraines in setting of elevated blood pressure, check MRI of brain ?Limit use of pain relievers to no more than 2 days out of week to prevent risk of rebound or medication-overuse headache. ?Keep headache diary ?Follow up 6 months. ?  ?  ?Subjective:  ?Heidi Rivera is a 56 year old right-handed black female with HTN and CKD stage III who follows up for migraines and lumbar radiculitis. ?  ?UPDATE: ?Started Emgality but she said it didn't help ?Intensity:  severe ?Duration:  4 days ?Frequency:  once a week ?She is now experiencing visual aura prior to onset of migraines - sees lights or blurred vision.  Eye pain is now sharp.  Blood pressure has been uncontrolled for a while (180s/110s).  She cannot tolerate taking two of blood pressure medications during the day so she may not take them.   ?  ?Current NSAIDS:  Tylenol ?Current analgesics:  none ?Current triptans:  none ?Current ergotamine:  none ?Current anti-emetic:  Compazine 10mg  ?Current muscle relaxants:  none ?Current anti-anxiolytic:  none ?Current sleep aide:  none ?Current Antihypertensive medications:  carvedilol, amlodipine; irbesartan ?Current Antidepressant medications:  none ?Current Anticonvulsant medications:  gabapentin 100mg  TID ?Current anti-CGRP:  none ?Current Vitamins/Herbal/Supplements:  none ?Current Antihistamines/Decongestants:  none ?Other therapy:  none ?Hormone/birth control:  none ?  ?Caffeine:  No coffee or soda. ?Diet:  Water.  1 bottle of iced green tea.  Seeing a dietician.   ?Exercise:  Not  routine ?Depression:  some; Anxiety:  some ?Other pain:  no ?Sleep:  Okay.  Questionable OSA as she has daytime fatigue. ?  ?HISTORY: ?Migraine: ?She has had migraines since her early 66s.  They are severe bifrontal-temporal pressure over her eyes.  There is associated nausea, decreased appetite, photophobia, phonophobia, osmophobia and neck stiffness but no vomiting, speech disturbance, weakness or numbness.  Over the past year, she was having uncontrolled hypertension and was diagnosed with CKD stage 3.  Migraines started to become less severe but associated with visual aura of seeing bright spots in her vision.  They were lasting 3 to 4 days.  Sumatriptan is helpful, in which she gets relief in 30-60 minutes and completely resolves in about 1 to 2 days.  In past 3 weeks, she has had 3 migraines.  The smell of perfume and onions trigger them.  She was previously taking Excedrin Migraine frequently but stopped as it contributed to her kidney dysfunction. ?  ?Lumbar Radiculitis: ?In mid-August 2021, she developed left lower back pain.  No preceding trauma or precipitating physical activity.  Prolonged sitting aggravated it.  Pain progressively got worse, causing numbness and tingling along the lateral and anterior left thigh down lateral leg and over dorsum of foot with associated shooting pain.  No bowel or bladder dysfunction, saddle anesthesia or focal lower extremity weakness.  However, due to severe pain, she was unable to walk and was stuck on the toilet for 45 minutes because she couldn't stand.  She went to the ED on 01/13/2020 where she was prescribed Flexeril and a prednisone taper.  She is now able to ambulate but  still with pain.  Started gabapentin for left lumbar radiculitis in August 2021.  MRI of lumbar spine on 02/03/2020 personally reviewed showed mild multilevel degenerative changes and multilevel facet arthropathy with minor congenital canal stenosis at L2-3 and L3-4 but no high-grade canal or  foraminal stenosis.  She has since been found to have bilateral tricompartmental osteoarthritis with small knee joint effusion bilaterally. ?  ?  ?Past NSAIDS:  Ibuprofen, naproxen ?Past analgesics:  Excedrin Migraine (effective); acetaminophen; tramadol ?Past abortive triptans:  sumatriptan 100mg , rizatriptan 10mg  ?Past abortive ergotamine:  none ?Past muscle relaxants:  none ?Past anti-emetic:  Zofran ?Past antihypertensive medications:  none ?Past antidepressant medications:  Amitriptyline 10mg  (caused elevated blood pressure) ?Past anticonvulsant medications:  topiramate, Depakote, gabapentin ?Past anti-CGRP:  Aimovig 70mg  (helpful for 4-5 months), Emgality ineffective ?Past vitamins/Herbal/Supplements:  none ?Past antihistamines/decongestants:  Flonase ?Other past therapies:  none ? ?PAST MEDICAL HISTORY: ?Past Medical History:  ?Diagnosis Date  ? Acute gastritis   ? CHRONIC KIDNEY DISEASE STAGE III (MODERATE) 07/11/2008  ? Qualifier: Diagnosis of  By: Tye Savoy MD, Tommi Rumps    ? Chronic left-sided low back pain with left-sided sciatica 07/23/2006  ? Qualifier: Diagnosis of  By: Herma Ard    ? Costochondritis, acute 06/17/2019  ? Essential hypertension, benign 08/30/2007  ? Qualifier: Diagnosis of  By: Darylene Price MD, Elta Guadeloupe    ? Gout   ? Hypertension   ? Laryngitis 10/13/2019  ? Migraine without aura 07/11/2008  ? Qualifier: Diagnosis of  By: Carlena Sax  MD, Colletta Maryland    ? Migraines   ? Obesity   ? OBESITY, NOS 07/23/2006  ? Qualifier: Diagnosis of  By: Herma Ard    ? Stress incontinence 06/17/2019  ? Vertigo   ? ? ?MEDICATIONS: ?Current Outpatient Medications on File Prior to Visit  ?Medication Sig Dispense Refill  ? acetaminophen (TYLENOL) 500 MG tablet Take 2 tablets (1,000 mg total) by mouth every 8 (eight) hours as needed. 30 tablet 0  ? amLODipine (NORVASC) 10 MG tablet Take 1 tablet (10 mg total) by mouth at bedtime.    ? Blood Pressure Monitoring (BLOOD PRESSURE CUFF) MISC 1 Device by Does not apply route  daily. 1 each 0  ? carvedilol (COREG) 6.25 MG tablet Take 1 tablet (6.25 mg total) by mouth 2 (two) times daily. 180 tablet 0  ? clopidogrel (PLAVIX) 75 MG tablet Take 1 tablet by mouth once daily 90 tablet 3  ? cyclobenzaprine (FLEXERIL) 10 MG tablet Take 1 tablet by mouth three times daily as needed for muscle spasm 20 tablet 0  ? diphenhydrAMINE (BENADRYL ALLERGY) 25 mg capsule Take 1 capsule (25 mg total) by mouth every 6 (six) hours as needed. 30 capsule 0  ? empagliflozin (JARDIANCE) 10 MG TABS tablet Take 1 tablet (10 mg total) by mouth daily. 30 tablet 0  ? furosemide (LASIX) 40 MG tablet Take 1.5 tablets (60 mg total) by mouth daily. 135 tablet 2  ? gabapentin (NEURONTIN) 100 MG capsule Take 1 capsule (100 mg total) by mouth 3 (three) times daily. 90 capsule 0  ? Galcanezumab-gnlm (EMGALITY) 120 MG/ML SOAJ Inject 120 mg into the skin every 28 (twenty-eight) days. 1.12 mL 5  ? hydrALAZINE (APRESOLINE) 10 MG tablet Take 1 tablet (10 mg total) by mouth 3 (three) times daily. 90 tablet 1  ? irbesartan (AVAPRO) 150 MG tablet Take 1 tablet (150 mg total) by mouth daily. 30 tablet 1  ? ketoconazole (NIZORAL) 2 % cream Apply 1 application topically daily. For 14 days  15 g 0  ? levalbuterol (XOPENEX HFA) 45 MCG/ACT inhaler Inhale 1-2 puffs into the lungs every 6 (six) hours as needed for wheezing. 1 each 12  ? meclizine (ANTIVERT) 12.5 MG tablet Take 1 tablet (12.5 mg total) by mouth 3 (three) times daily as needed for dizziness or nausea. 30 tablet 0  ? oxybutynin (DITROPAN) 5 MG tablet Take 1 tablet (5 mg total) by mouth 2 (two) times daily. 30 tablet 2  ? pravastatin (PRAVACHOL) 40 MG tablet Take 1 tablet (40 mg total) by mouth at bedtime. 90 tablet 3  ? prochlorperazine (COMPAZINE) 10 MG tablet Take 1 tablet (10 mg total) by mouth every 6 (six) hours as needed for nausea or vomiting. 30 tablet 0  ? rizatriptan (MAXALT-MLT) 10 MG disintegrating tablet Take 1 tablet at earliest onset of migraine. May repeat in 2  hours if needed. Maximum 2 tablets in 24 hours. 10 tablet 5  ? valproic acid (DEPAKENE) 250 MG capsule Take 2 capsules (500 mg total) by mouth once for 1 dose. 2 capsule 0  ? Vitamin D, Ergocalciferol, (DRISDOL)

## 2021-09-25 ENCOUNTER — Other Ambulatory Visit: Payer: Self-pay

## 2021-09-25 ENCOUNTER — Encounter: Payer: Self-pay | Admitting: Neurology

## 2021-09-25 ENCOUNTER — Emergency Department (HOSPITAL_BASED_OUTPATIENT_CLINIC_OR_DEPARTMENT_OTHER)
Admission: EM | Admit: 2021-09-25 | Discharge: 2021-09-25 | Disposition: A | Discharge status: Home / Self Care | Payer: No Typology Code available for payment source | Attending: Emergency Medicine | Admitting: Emergency Medicine

## 2021-09-25 ENCOUNTER — Encounter (HOSPITAL_BASED_OUTPATIENT_CLINIC_OR_DEPARTMENT_OTHER): Payer: Self-pay

## 2021-09-25 ENCOUNTER — Telehealth (HOSPITAL_COMMUNITY): Payer: Self-pay | Admitting: Pharmacy Technician

## 2021-09-25 ENCOUNTER — Ambulatory Visit: Payer: No Typology Code available for payment source | Admitting: Neurology

## 2021-09-25 VITALS — BP 199/115 | HR 98 | Ht 69.0 in | Wt 308.8 lb

## 2021-09-25 DIAGNOSIS — G43009 Migraine without aura, not intractable, without status migrainosus: Secondary | ICD-10-CM

## 2021-09-25 DIAGNOSIS — I160 Hypertensive urgency: Secondary | ICD-10-CM | POA: Insufficient documentation

## 2021-09-25 DIAGNOSIS — R519 Headache, unspecified: Secondary | ICD-10-CM | POA: Diagnosis present

## 2021-09-25 DIAGNOSIS — Z79899 Other long term (current) drug therapy: Secondary | ICD-10-CM | POA: Insufficient documentation

## 2021-09-25 DIAGNOSIS — I10 Essential (primary) hypertension: Secondary | ICD-10-CM | POA: Diagnosis not present

## 2021-09-25 LAB — BASIC METABOLIC PANEL
Anion gap: 9 (ref 5–15)
BUN: 30 mg/dL — ABNORMAL HIGH (ref 6–20)
CO2: 28 mmol/L (ref 22–32)
Calcium: 9.5 mg/dL (ref 8.9–10.3)
Chloride: 102 mmol/L (ref 98–111)
Creatinine, Ser: 2.79 mg/dL — ABNORMAL HIGH (ref 0.44–1.00)
GFR, Estimated: 19 mL/min — ABNORMAL LOW (ref >60)
Glucose, Bld: 180 mg/dL — ABNORMAL HIGH (ref 70–99)
Potassium: 3.5 mmol/L (ref 3.5–5.1)
Sodium: 139 mmol/L (ref 135–145)

## 2021-09-25 LAB — CBC WITH DIFFERENTIAL/PLATELET
Abs Immature Granulocytes: 0.01 10*3/uL (ref 0.00–0.07)
Basophils Absolute: 0 10*3/uL (ref 0.0–0.1)
Basophils Relative: 0 %
Eosinophils Absolute: 0.1 10*3/uL (ref 0.0–0.5)
Eosinophils Relative: 2 %
HCT: 40.4 % (ref 36.0–46.0)
Hemoglobin: 13.1 g/dL (ref 12.0–15.0)
Immature Granulocytes: 0 %
Lymphocytes Relative: 37 %
Lymphs Abs: 2.5 10*3/uL (ref 0.7–4.0)
MCH: 27.2 pg (ref 26.0–34.0)
MCHC: 32.4 g/dL (ref 30.0–36.0)
MCV: 83.8 fL (ref 80.0–100.0)
Monocytes Absolute: 0.5 10*3/uL (ref 0.1–1.0)
Monocytes Relative: 7 %
Neutro Abs: 3.5 10*3/uL (ref 1.7–7.7)
Neutrophils Relative %: 54 %
Platelets: 209 10*3/uL (ref 150–400)
RBC: 4.82 MIL/uL (ref 3.87–5.11)
RDW: 13.1 % (ref 11.5–15.5)
WBC: 6.6 10*3/uL (ref 4.0–10.5)
nRBC: 0 % (ref 0.0–0.2)

## 2021-09-25 MED ORDER — UBRELVY 100 MG PO TABS: 1.0000 | ORAL_TABLET | ORAL | 5 refills | Status: DC | PRN

## 2021-09-25 MED ORDER — AJOVY 225 MG/1.5ML ~~LOC~~ SOAJ: 225.0000 mg | SUBCUTANEOUS | 5 refills | Status: DC

## 2021-09-25 MED ORDER — HYDROCHLOROTHIAZIDE 25 MG PO TABS
25.0000 mg | ORAL_TABLET | Freq: Once | ORAL | Status: AC
  Administered 2021-09-25: 25 mg via ORAL
  Filled 2021-09-25: qty 1

## 2021-09-25 NOTE — Patient Instructions (Signed)
Start Ajovy every 28 days ?At earliest onset of migraine, take ubrelvy - may repeat after 2 hours.  Maximum 2 tablets in 24 hours ?Check MRI of brain ?Go to ED because your blood pressure is too high ?Follow up 6 months ?

## 2021-09-25 NOTE — Telephone Encounter (Signed)
Patient Advocate Encounter ?  ?Received notification that prior authorization for AJOVY (fremanezumab-vfrm) injection 225MG /1.5ML auto-injectors is required. ?  ?PA submitted on 09/25/2021 ?Key [id number] ?Status is pending ?   ? ? ? ?Lyndel Safe, CPhT ?Pharmacy Patient Advocate Specialist ?[facility] Patient Advocate Team ?Direct Number: [phone]  Fax: [phone]  ?

## 2021-09-25 NOTE — Telephone Encounter (Signed)
Patient Advocate Encounter ? ?Prior Authorization for AJOVY (fremanezumab-vfrm) injection 225MG /1.5ML auto-injectors has been approved.   ? ?PA# [id number] ?Effective dates: 09/25/2021 through 12/26/2021 ? ? ? ? ? ?Lyndel Safe, CPhT ?Pharmacy Patient Advocate Specialist ?[place] Patient Advocate Team ?Direct Number: [phone]  Fax: [phone]  ?

## 2021-09-25 NOTE — Discharge Instructions (Signed)
You are seen in the ER for elevated blood pressure. ? ?As discussed, consider checking your BP 3 times a day and writing it down on a piece of paper, and taking that with you to your nephrologist in 7 to 10 days. ? ?If your BP is over 180 when you check the BP and you have just taken your blood pressure medicine or are about to take your blood pressure medicine, wait 2 hours, recheck your blood pressure.  If the blood pressure is still over 180, take an extra 25 mg hydralazine pill. ? ?Return to the ER if you start having severe headaches, chest pain, strokelike symptoms such as one-sided weakness, numbness, slurred speech. ?

## 2021-09-25 NOTE — ED Triage Notes (Signed)
Patient here POV from Home. ? ?Endorses having History of HTN and Migraines and being at Neurologist today and being evaluated for Migraines. Instructed to Seek ED Evaluation for BP of 199/115 at the Neurologist this AM ? ?Slight Headache approximately 1 hour PTA. Patient stopped taking Norvasc and Coreg a month ago due to Side Effects. All other Medications have been maintained. ? ?Mild Nausea. No Emesis. ? ?NAD Noted during Triage. A&Ox4. GCS 15. Ambulatory. ?

## 2021-09-25 NOTE — Telephone Encounter (Signed)
Patient Advocate Encounter ?  ?Received notification that prior authorization for Ubrelvy 100MG  tablets is required. ?  ?PA submitted on 09/25/2021 ?Key [id number] ?Status is pending ?   ? ? ? ?Lyndel Safe, CPhT ?Pharmacy Patient Advocate Specialist ?[facility] Patient Advocate Team ?Direct Number: [phone]  Fax: [phone]  ?

## 2021-09-26 NOTE — Telephone Encounter (Signed)
Patient Advocate Encounter ? ?Prior Authorization for  ?Roselyn Meier 100MG  tablets has been approved.   ? ?PA# [id number] ?Effective dates:  09/25/2021 through 09/26/2022 ? ? ? ? ?Lady Deutscher, CPhT-Adv ?Pharmacy Patient Advocate Specialist ?[facility] Patient Advocate Team ?Direct Number: [phone]  Fax: [phone] ? ?

## 2021-09-28 ENCOUNTER — Ambulatory Visit (HOSPITAL_COMMUNITY)
Admission: EM | Admit: 2021-09-28 | Discharge: 2021-09-28 | Disposition: A | Discharge status: Home / Self Care | Payer: No Typology Code available for payment source | Attending: Emergency Medicine | Admitting: Emergency Medicine

## 2021-09-28 ENCOUNTER — Encounter (HOSPITAL_COMMUNITY): Payer: Self-pay | Admitting: Emergency Medicine

## 2021-09-28 DIAGNOSIS — B369 Superficial mycosis, unspecified: Secondary | ICD-10-CM

## 2021-09-28 DIAGNOSIS — H6243 Otitis externa in other diseases classified elsewhere, bilateral: Secondary | ICD-10-CM

## 2021-09-28 MED ORDER — CLOTRIMAZOLE 1 % EX SOLN: 1.0000 "application " | Freq: Two times a day (BID) | CUTANEOUS | 0 refills | Status: DC

## 2021-09-28 NOTE — Discharge Instructions (Signed)
Please use drops as prescribed. ? ?Please return to the urgent care or emergency department if symptoms worsen or do not improve. ? ?

## 2021-09-28 NOTE — ED Triage Notes (Signed)
Having itching, pain and fullness to bilat ears. Reports had right ear infection 3 weeks ago and was given drops. Left ear draining at night. Has appt ENT appt but not til 26th.  ?

## 2021-09-28 NOTE — ED Provider Notes (Signed)
?[facility] ? ? ? ?CSN: [id number] ?Arrival date & time: 09/28/21  1647 ? ?  ? ?History   ?Chief Complaint ?Chief Complaint  ?Patient presents with  ? Ear Fullness  ? ? ?HPI ?Heidi Rivera is a 56 y.o. female.  ?Patient presents for bilateral ear fullness and drainage.  States left ear was draining last night.  Both ears are itching and she has been scratching them with her nails. She has history of recurrent otitis externa with last episode in April of this year. Was referred to ENT but appointment is not until the 26th. She has used Ciprodex in the past but states this has not worked for her recently. ? ?No history of diabetes. ? ?Past Medical History:  ?Diagnosis Date  ? Acute gastritis   ? CHRONIC KIDNEY DISEASE STAGE III (MODERATE) 07/11/2008  ? Qualifier: Diagnosis of  By: Tye Savoy MD, Tommi Rumps    ? Chronic left-sided low back pain with left-sided sciatica 07/23/2006  ? Qualifier: Diagnosis of  By: Herma Ard    ? Costochondritis, acute 06/17/2019  ? Essential hypertension, benign 08/30/2007  ? Qualifier: Diagnosis of  By: Darylene Price MD, Elta Guadeloupe    ? Gout   ? Hypertension   ? Laryngitis 10/13/2019  ? Migraine without aura 07/11/2008  ? Qualifier: Diagnosis of  By: Carlena Sax  MD, Colletta [place]    ? Migraines   ? Obesity   ? OBESITY, NOS 07/23/2006  ? Qualifier: Diagnosis of  By: Herma Ard    ? Stress incontinence 06/17/2019  ? Vertigo   ? ? ?Patient Active Problem List  ? Diagnosis Date Noted  ? Pain of left heel 09/12/2021  ? Otitis externa 04/12/2021  ? Inflamed skin tag 02/12/2021  ? Statin intolerance 08/22/2020  ? DOE (dyspnea on exertion) 08/21/2020  ? Lower extremity pain 08/13/2020  ? Bilateral knee pain 08/13/2020  ? Hypertension 07/23/2020  ? Vitamin D deficiency 07/23/2020  ? Tinea pedis of right foot 07/23/2020  ? Itching 07/23/2020  ? Dizziness 05/30/2020  ? Renal artery stenosis ([place]) 04/13/2020  ? Mixed incontinence urge and stress 06/17/2019  ? Costochondritis, acute 06/17/2019  ?  Migraine without aura 07/11/2008  ? CHRONIC KIDNEY DISEASE STAGE III (MODERATE) 07/11/2008  ? OBESITY, NOS 07/23/2006  ? Chronic left-sided low back pain with left-sided sciatica 07/23/2006  ? ? ?Past Surgical History:  ?Procedure Laterality Date  ? HEMORRHOID SURGERY    ? PERIPHERAL VASCULAR INTERVENTION  04/26/2020  ? Procedure: PERIPHERAL VASCULAR INTERVENTION;  Surgeon: [name], MD;  Location: [facility];  Service: Cardiovascular;;  left renal  ? RENAL ANGIOGRAPHY Right 04/26/2020  ? Procedure: RENAL ANGIOGRAPHY;  Surgeon: [name], MD;  Location: [facility];  Service: Cardiovascular;  Laterality: Right;  ? ? ?OB History   ?No obstetric history on file. ?  ? ? ? ?Home Medications   ? ?Prior to Admission medications   ?Medication Sig Start Date End Date Taking? Authorizing Provider  ?clotrimazole ([facility] CLOTRIMAZOLE) 1 % external solution Apply 1 application. topically 2 (two) times daily. 2-3 drops in each ear, 2 times daily, for 14 days 09/28/21  Yes Brielynn Sekula, Wells Guiles, PA-C  ?acetaminophen (TYLENOL) 500 MG tablet Take 2 tablets (1,000 mg total) by mouth every 8 (eight) hours as needed. 09/11/20   [name], DO  ?amLODipine (NORVASC) 10 MG tablet Take 1 tablet (10 mg total) by mouth at bedtime. 04/12/21   [name], DO  ?Blood Pressure Monitoring (BLOOD PRESSURE CUFF) MISC  1 Device by Does not apply route daily. 06/17/19   Mullis, Kiersten P, DO  ?carvedilol (COREG) 6.25 MG tablet Take 1 tablet (6.25 mg total) by mouth 2 (two) times daily. 01/24/21   Freada Bergeron, MD  ?clopidogrel (PLAVIX) 75 MG tablet Take 1 tablet by mouth once daily 12/21/20   [name], MD  ?cyclobenzaprine (FLEXERIL) 10 MG tablet Take 1 tablet by mouth three times daily as needed for muscle spasm 05/21/21   [name], DO  ?diphenhydrAMINE (BENADRYL ALLERGY) 25 mg capsule Take 1 capsule (25 mg total) by mouth every 6 (six) hours as needed. 09/11/20   [name], DO  ?empagliflozin  (JARDIANCE) 10 MG TABS tablet Take 1 tablet (10 mg total) by mouth daily. 03/11/21   Shary Key, DO  ?Fremanezumab-vfrm (AJOVY) 225 MG/1.5ML SOAJ Inject 225 mg into the skin every 28 (twenty-eight) days. 09/25/21   Pieter Partridge, DO  ?furosemide (LASIX) 40 MG tablet Take 1.5 tablets (60 mg total) by mouth daily. 09/11/20 06/08/21  [name], DO  ?gabapentin (NEURONTIN) 100 MG capsule Take 1 capsule (100 mg total) by mouth 3 (three) times daily. 09/06/21   [name], DO  ?hydrALAZINE (APRESOLINE) 10 MG tablet Take 1 tablet (10 mg total) by mouth 3 (three) times daily. 02/12/21   Lurline Del, DO  ?irbesartan (AVAPRO) 150 MG tablet Take 1 tablet (150 mg total) by mouth daily. 08/29/20   Lurline Del, DO  ?ketoconazole (NIZORAL) 2 % cream Apply 1 application topically daily. For 14 days 07/23/20   [name], DO  ?levalbuterol [facility] HFA) 45 MCG/ACT inhaler Inhale 1-2 puffs into the lungs every 6 (six) hours as needed for wheezing. 06/19/21   Lurline Del, DO  ?meclizine (ANTIVERT) 12.5 MG tablet Take 1 tablet (12.5 mg total) by mouth 3 (three) times daily as needed for dizziness or nausea. 05/30/20   Matilde Haymaker, MD  ?oxybutynin (DITROPAN) 5 MG tablet Take 1 tablet (5 mg total) by mouth 2 (two) times daily. 01/24/21   [name], DO  ?pravastatin (PRAVACHOL) 40 MG tablet Take 1 tablet (40 mg total) by mouth at bedtime. 08/29/20   Lurline Del, DO  ?prochlorperazine (COMPAZINE) 10 MG tablet Take 1 tablet (10 mg total) by mouth every 6 (six) hours as needed for nausea or vomiting. 09/11/20   [name], DO  ?Ubrogepant (UBRELVY) 100 MG TABS Take 1 tablet by mouth as needed (May repeat after 2 hours.  Maximum 2 tablets in 24 hours.). 09/25/21   Pieter Partridge, DO  ?valproic acid (DEPAKENE) 250 MG capsule Take 2 capsules (500 mg total) by mouth once for 1 dose. 12/21/20 12/21/20  Rise Patience, DO  ?Vitamin D, Ergocalciferol, (DRISDOL) 1.25 MG (50000 UNIT) CAPS capsule Take 50,000 Units by mouth every  Monday.  12/22/19   [provider]  ?fluticasone (FLONASE) 50 MCG/ACT nasal spray Place 2 sprays into both nostrils daily. 09/19/18 06/15/19  Ok Edwards, PA-C  ?Galcanezumab-gnlm (EMGALITY) 120 MG/ML SOAJ Inject 120 mg into the skin every 28 (twenty-eight) days. ?Patient not taking: Reported on 09/25/2021 03/11/21   Pieter Partridge, DO  ?rizatriptan (MAXALT-MLT) 10 MG disintegrating tablet Take 1 tablet at earliest onset of migraine. May repeat in 2 hours if needed. Maximum 2 tablets in 24 hours. ?Patient not taking: Reported on 09/25/2021 03/11/21   Pieter Partridge, DO  ? ? ?Family History ?Family History  ?Problem Relation Age of Onset  ? Diabetes Mother   ? Breast cancer Mother   ?  Other Mother   ?     Larynx cancer  ? Diabetes Maternal Aunt   ? Hypertension Father   ? Hypercholesterolemia Father   ? Other Father   ?     DDD  ? Cancer - Other Paternal Grandmother   ?     bone  ? Colon cancer Maternal Uncle   ? ? ?Social History ?Social History  ? ?Tobacco Use  ? Smoking status: Never  ? Smokeless tobacco: Never  ?Vaping Use  ? Vaping Use: Never used  ?Substance Use Topics  ? Alcohol use: Yes  ?  Comment: rare  ? Drug use: No  ? ? ? ?Allergies   ?Tramadol ? ? ?Review of Systems ?Review of Systems  ?Constitutional: Negative.   ?HENT:  Positive for ear discharge and ear pain.   ?     Ear itching  ?Eyes: Negative.   ?Respiratory: Negative.    ?Cardiovascular: Negative.   ?Neurological: Negative.   ? ? ?Physical Exam ? ?Vital Signs ?BP (!) 163/104 (BP Location: Right Arm)   Pulse 61   Temp 98.1 ?F (36.7 ?C) (Oral)   Resp 18   SpO2 96%  ? ? ?Physical Exam ?Vitals and nursing note reviewed.  ?HENT:  ?   Right Ear: External ear normal. Decreased hearing noted.  ?   Left Ear: External ear normal. Decreased hearing noted.  ?   Ears:  ?   Comments: There is a white-gray looking mass occluding both ear canals.  Possibly fungal ?   Mouth/Throat:  ?   Mouth: Mucous membranes are moist.  ?   Pharynx: Oropharynx is clear.   ?Cardiovascular:  ?   Rate and Rhythm: Normal rate and regular rhythm.  ?   Heart sounds: Normal heart sounds.  ?Pulmonary:  ?   Effort: Pulmonary effort is normal.  ?   Breath sounds: Normal breath sounds.  ? ? ?UC

## 2021-09-29 NOTE — ED Provider Notes (Signed)
?[facility] DEPT ?Provider Note ? ? ?CSN: [id number] ?Arrival date & time: 09/25/21  1901 ? ?  ? ?History ? ?Chief Complaint  ?Patient presents with  ? Hypertension  ? ? ?Heidi Rivera is a 56 y.o. female. ? ?HPI ? ?  ? ?56 year old female comes in with chief complaint of elevated blood pressure.  Patient was seen by neurologist earlier today for her headaches.  They noted that her blood pressure was 199/115, and sent to the ER for further evaluation. ? ?Patient indicates that she has been having headaches off and on for the last several months.  She indicates that the headaches are frontal, throbbing and similar to her migraine pains.  She has no new associated symptoms with them, particularly at this point she is not having any new vision changes, lightheadedness, confusion, one-sided weakness or numbness. ? ?Patient indicates that her blood pressure has been hard to control.  She discontinued Norvasc and Coreg recently because of side effects.  Her hydralazine dose was increased at that time.  Her BP normally runs in the 190s range.  She is managed by kidney specialist who is helping her with her blood pressure management. ? ?Review of system is also negative for chest pain, shortness of breath. ? ?Home Medications ?Prior to Admission medications   ?Medication Sig Start Date End Date Taking? Authorizing Provider  ?acetaminophen (TYLENOL) 500 MG tablet Take 2 tablets (1,000 mg total) by mouth every 8 (eight) hours as needed. 09/11/20   [name], DO  ?amLODipine (NORVASC) 10 MG tablet Take 1 tablet (10 mg total) by mouth at bedtime. 04/12/21   [name], DO  ?Blood Pressure Monitoring (BLOOD PRESSURE CUFF) MISC 1 Device by Does not apply route daily. 06/17/19   Mullis, Kiersten P, DO  ?carvedilol (COREG) 6.25 MG tablet Take 1 tablet (6.25 mg total) by mouth 2 (two) times daily. 01/24/21   Freada Bergeron, MD  ?clopidogrel (PLAVIX) 75 MG tablet Take 1 tablet by mouth once  daily 12/21/20   [name], MD  ?clotrimazole ([facility] CLOTRIMAZOLE) 1 % external solution Apply 1 application. topically 2 (two) times daily. 2-3 drops in each ear, 2 times daily, for 14 days 09/28/21   Rising, Wells Guiles, PA-C  ?cyclobenzaprine (FLEXERIL) 10 MG tablet Take 1 tablet by mouth three times daily as needed for muscle spasm 05/21/21   [name], DO  ?diphenhydrAMINE (BENADRYL ALLERGY) 25 mg capsule Take 1 capsule (25 mg total) by mouth every 6 (six) hours as needed. 09/11/20   [name], DO  ?empagliflozin (JARDIANCE) 10 MG TABS tablet Take 1 tablet (10 mg total) by mouth daily. 03/11/21   Shary Key, DO  ?Fremanezumab-vfrm (AJOVY) 225 MG/1.5ML SOAJ Inject 225 mg into the skin every 28 (twenty-eight) days. 09/25/21   Pieter Partridge, DO  ?furosemide (LASIX) 40 MG tablet Take 1.5 tablets (60 mg total) by mouth daily. 09/11/20 06/08/21  [name], DO  ?gabapentin (NEURONTIN) 100 MG capsule Take 1 capsule (100 mg total) by mouth 3 (three) times daily. 09/06/21   [name], DO  ?hydrALAZINE (APRESOLINE) 10 MG tablet Take 1 tablet (10 mg total) by mouth 3 (three) times daily. 02/12/21   Lurline Del, DO  ?irbesartan (AVAPRO) 150 MG tablet Take 1 tablet (150 mg total) by mouth daily. 08/29/20   Lurline Del, DO  ?ketoconazole (NIZORAL) 2 % cream Apply 1 application topically daily. For 14 days 07/23/20   [name], DO  ?levalbuterol [facility] HFA) 45 MCG/ACT inhaler  Inhale 1-2 puffs into the lungs every 6 (six) hours as needed for wheezing. 06/19/21   Lurline Del, DO  ?meclizine (ANTIVERT) 12.5 MG tablet Take 1 tablet (12.5 mg total) by mouth 3 (three) times daily as needed for dizziness or nausea. 05/30/20   Matilde Haymaker, MD  ?oxybutynin (DITROPAN) 5 MG tablet Take 1 tablet (5 mg total) by mouth 2 (two) times daily. 01/24/21   [name], DO  ?pravastatin (PRAVACHOL) 40 MG tablet Take 1 tablet (40 mg total) by mouth at bedtime. 08/29/20   Lurline Del, DO  ?prochlorperazine  (COMPAZINE) 10 MG tablet Take 1 tablet (10 mg total) by mouth every 6 (six) hours as needed for nausea or vomiting. 09/11/20   [name], DO  ?Ubrogepant (UBRELVY) 100 MG TABS Take 1 tablet by mouth as needed (May repeat after 2 hours.  Maximum 2 tablets in 24 hours.). 09/25/21   Pieter Partridge, DO  ?valproic acid (DEPAKENE) 250 MG capsule Take 2 capsules (500 mg total) by mouth once for 1 dose. 12/21/20 12/21/20  Rise Patience, DO  ?Vitamin D, Ergocalciferol, (DRISDOL) 1.25 MG (50000 UNIT) CAPS capsule Take 50,000 Units by mouth every Monday.  12/22/19   [provider]  ?fluticasone (FLONASE) 50 MCG/ACT nasal spray Place 2 sprays into both nostrils daily. 09/19/18 06/15/19  Ok Edwards, PA-C  ?Galcanezumab-gnlm (EMGALITY) 120 MG/ML SOAJ Inject 120 mg into the skin every 28 (twenty-eight) days. ?Patient not taking: Reported on 09/25/2021 03/11/21   Pieter Partridge, DO  ?rizatriptan (MAXALT-MLT) 10 MG disintegrating tablet Take 1 tablet at earliest onset of migraine. May repeat in 2 hours if needed. Maximum 2 tablets in 24 hours. ?Patient not taking: Reported on 09/25/2021 03/11/21   Pieter Partridge, DO  ?   ? ?Allergies    ?Tramadol   ? ?Review of Systems   ?Review of Systems  ?All other systems reviewed and are negative. ? ?Physical Exam ?Updated Vital Signs ?BP (!) 156/91   Pulse (!) 59   Temp 97.9 ?F (36.6 ?C) (Oral)   Resp 16   Ht 5\' 9"  (1.753 m)   Wt (!) 139.7 kg   SpO2 98%   BMI 45.48 kg/m?  ?Physical Exam ?Vitals and nursing note reviewed.  ?Constitutional:   ?   Appearance: She is well-developed.  ?HENT:  ?   Head: Atraumatic.  ?Eyes:  ?   Extraocular Movements: Extraocular movements intact.  ?   Pupils: Pupils are equal, round, and reactive to light.  ?Cardiovascular:  ?   Rate and Rhythm: Normal rate.  ?Pulmonary:  ?   Effort: Pulmonary effort is normal.  ?Musculoskeletal:  ?   Cervical back: Normal range of motion and neck supple.  ?Skin: ?   General: Skin is warm and dry.  ?Neurological:  ?   Mental  Status: She is alert and oriented to person, place, and time.  ? ? ?ED Results / Procedures / Treatments   ?Labs ?(all labs ordered are listed, but only abnormal results are displayed) ?Labs Reviewed  ?BASIC METABOLIC PANEL - Abnormal; Notable for the following components:  ?    Result Value  ? Glucose, Bld 180 (*)   ? BUN 30 (*)   ? Creatinine, Ser 2.79 (*)   ? GFR, Estimated 19 (*)   ? All other components within normal limits  ?CBC WITH DIFFERENTIAL/PLATELET  ? ? ?EKG ?EKG Interpretation ? ?Date/Time:  Wednesday Sep 25 2021 19:30:48 EDT ?Ventricular Rate:  60 ?PR Interval:  135 ?  QRS Duration: 91 ?QT Interval:  502 ?QTC Calculation: 502 ?R Axis:   23 ?Text Interpretation: Sinus rhythm LVH with secondary repolarization abnormality Borderline prolonged QT interval No acute changes No significant change since last tracing Confirmed by Varney Biles [phone]) on 09/25/2021 9:45:38 PM ? ?Radiology ?No results found. ? ?Procedures ?Procedures  ? ? ?Medications Ordered in ED ?Medications  ?hydrochlorothiazide (HYDRODIURIL) tablet 25 mg (25 mg Oral Given 09/25/21 2003)  ? ? ?ED Course/ Medical Decision Making/ A&P ?  ?                        ?Medical Decision Making ?Amount and/or Complexity of Data Reviewed ?Labs: ordered. ? ?Risk ?Prescription drug management. ? ? ?This patient presents to the ED with chief complaint(s) of elevated blood pressure without any acute new associated symptoms with pertinent past medical history of CKD, migraines which further complicates the presenting complaint.  ? ?The differential diagnosis includes hypertensive urgency, asymptomatic essential hypertension that is poorly controlled, hypertensive emergency. ? ?Patient has no new neuro complaints.  She has no neurodeficits.  Headaches appear to be migrainous and not because of brain bleed.  There is no confusion, therefore doubt that this is press syndrome.  BP allegedly is always elevated, unlikely to be hypertensive headache, especially since  the headaches are typical for her migraine. ? ?The initial plan is to order labs to make sure there is no acceleration in her renal failure. ? ?I will also give patient 1 oral hydralazine for now. ? ? ?Additional

## 2021-10-08 ENCOUNTER — Ambulatory Visit (INDEPENDENT_AMBULATORY_CARE_PROVIDER_SITE_OTHER): Payer: No Typology Code available for payment source | Admitting: Family Medicine

## 2021-10-08 VITALS — BP 172/95 | HR 64 | Temp 97.8°F | Ht 69.0 in | Wt 306.8 lb

## 2021-10-08 DIAGNOSIS — H9213 Otorrhea, bilateral: Secondary | ICD-10-CM

## 2021-10-08 DIAGNOSIS — H6503 Acute serous otitis media, bilateral: Secondary | ICD-10-CM | POA: Diagnosis not present

## 2021-10-08 DIAGNOSIS — I10 Essential (primary) hypertension: Secondary | ICD-10-CM | POA: Diagnosis not present

## 2021-10-08 DIAGNOSIS — H6590 Unspecified nonsuppurative otitis media, unspecified ear: Secondary | ICD-10-CM | POA: Insufficient documentation

## 2021-10-08 MED ORDER — HYDROCORTISONE 0.5 % EX CREA: 1.0000 "application " | TOPICAL_CREAM | Freq: Two times a day (BID) | CUTANEOUS | 0 refills | Status: DC

## 2021-10-08 MED ORDER — AMOXICILLIN-POT CLAVULANATE 875-125 MG PO TABS: 1.0000 | ORAL_TABLET | Freq: Two times a day (BID) | ORAL | 0 refills | Status: DC

## 2021-10-08 MED ORDER — HYDRALAZINE HCL 10 MG PO TABS: 75.0000 mg | ORAL_TABLET | Freq: Three times a day (TID) | ORAL | 1 refills | Status: DC

## 2021-10-08 NOTE — Patient Instructions (Addendum)
Thank you for coming to see me today. It was a pleasure.  ? ?I sent in a prescription for Augmentin take 1 tablet 2 times a day. ?Use hydrocortisone ointment on the outer portion of your ear at the canal twice daily for itching. ? ?Follow-up with your doctor as scheduled. ? ?Recommend Shingles vaccine.  This is a 2 dose series and can be given at your local pharmacy.  Please talk to your pharmacist about this.  ? ?Follow-up with PCP to discuss continued healthcare maintenance ? ?If you have any questions or concerns, please do not hesitate to call the office at [phone]. ? ?Best,  ? ?Carollee Leitz, MD   ? ? ?Otitis Media, Adult ? ?Otitis media is a condition in which the middle ear is red and swollen (inflamed) and full of fluid. The middle ear is the part of the ear that contains bones for hearing as well as air that helps send sounds to the brain. The condition usually goes away on its own. ?What are the causes? ?This condition is caused by a blockage in the eustachian tube. This tube connects the middle ear to the back of the nose. It normally allows air into the middle ear. The blockage is caused by fluid or swelling. Problems that can cause blockage include: ?A cold or infection that affects the nose, mouth, or throat. ?Allergies. ?An irritant, such as tobacco smoke. ?Adenoids that have become large. The adenoids are soft tissue located in the back of the throat, behind the nose and the roof of the mouth. ?Growth or swelling in the upper part of the throat, just behind the nose (nasopharynx). ?Damage to the ear caused by a change in pressure. This is called barotrauma. ?What increases the risk? ?You are more likely to develop this condition if you: ?Smoke or are exposed to tobacco smoke. ?Have an opening in the roof of your mouth (cleft palate). ?Have acid reflux. ?Have problems in your body's defense system (immune system). ?What are the signs or symptoms? ?Symptoms of this condition include: ?Ear  pain. ?Fever. ?Problems with hearing. ?Being tired. ?Fluid leaking from the ear. ?Ringing in the ear. ?How is this treated? ?This condition can go away on its own within 3-5 days. But if the condition is caused by germs (bacteria) and does not go away on its own, or if it keeps coming back, your doctor may: ?Give you antibiotic medicines. ?Give you medicines for pain. ?Follow these instructions at home: ?Take over-the-counter and prescription medicines only as told by your doctor. ?If you were prescribed an antibiotic medicine, take it as told by your doctor. Do not stop taking it even if you start to feel better. ?Keep all follow-up visits. ?Contact a doctor if: ?You have bleeding from your nose. ?There is a lump on your neck. ?You are not feeling better in 5 days. ?You feel worse instead of better. ?Get help right away if: ?You have pain that is not helped with medicine. ?You have swelling, redness, or pain around your ear. ?You get a stiff neck. ?You cannot move part of your face (paralysis). ?You notice that the bone behind your ear hurts when you touch it. ?You get a very bad headache. ?Summary ?Otitis media means that the middle ear is red, swollen, and full of fluid. ?This condition usually goes away on its own. ?If the problem does not go away, treatment may be needed. You may be given medicines to treat the infection or to treat your pain. ?  If you were prescribed an antibiotic medicine, take it as told by your doctor. Do not stop taking it even if you start to feel better. ?Keep all follow-up visits. ?This information is not intended to replace advice given to you by your health care provider. Make sure you discuss any questions you have with your health care provider. ?Document Revised: 08/20/2020 Document Reviewed: 08/20/2020 ?Elsevier Patient Education ? [place]. ? ?

## 2021-10-08 NOTE — Progress Notes (Signed)
? ? ?  SUBJECTIVE:  ? ?CHIEF COMPLAINT / HPI: Bilateral ear pain and drainage from left ear ? ?Reports continues to have ear pain and drainage.  Left greater than right.  Was treated for otitis externa with Ciprodex x2.  No resolution.  Was seen in urgent care and told was yeast infection.  Recommended clotrimazole ear ointment and referral to ENT.  Patient reports has had no improvement with antifungal medication.  She has tried calling ENT to be seen earlier but unfortunately has not been able to get an earlier appointment.  Reports pain in both ears.  Drainage from left ear is clear and appears to be worse at night.  Pruritus of both ears.  Has had decreased hearing in left ear.  Denies any dizziness, fevers, weakness, worsening headaches, nasal discharge. ? ? ?PERTINENT  PMH / PSH:  ?Hypertension ?Chronic kidney disease ?Left renal artery stenosis ?Migraines without aura ? ? ?OBJECTIVE:  ? ?BP (!) 172/95   Pulse 64   Temp 97.8 ?F (36.6 ?C)   Ht 5\' 9"  (1.753 m)   Wt (!) 306 lb 12.8 oz (139.2 kg)   SpO2 96%   BMI 45.31 kg/m?   ? ?General: Alert, no acute distress ?Cardio: Normal S1 and S2, RRR, no r/m/g ?HEENT: Left ear canal erythema, TM dull without bulging, retraction, rupture.  No discharge appreciated.  Right ear canal erythema and edematous, thick serous discharge noted, TM barely visible but appears erythematous.  Tenderness along anterior cervical chain bilaterally. ? ? ?ASSESSMENT/PLAN:  ? ?Serous otitis media ?Has had no improvement with Ciprodex or antifungal treatment. ?Augmentin 875/125 mg p.o. twice daily x10 days ?Hydrocortisone ointment 1% to apply to external ear canal twice daily ?Bilateral ear culture and fungal swabs obtained ?Follow-up with ENT as scheduled May 23 ?Follow-up with PCP as needed ?Strict return precautions provided ? ? ?Hypertension ?Initial blood pressure 160/100.  Repeat mains elevated at 172/95.  Was recently seen by nephrology and increased hydralazine to 75 mg 3 times  daily.   History of left renal artery stenosis. ?Current blood pressure medications irbesartan 150 mg daily and hydralazine 75 mg 3 times daily.  ?Follows with [facility]. ?Follow-up PCP as needed ?Strict return precautions provided ?  ?Recommend shingles vaccine. ?Follow-up PCP for all healthcare maintenance ? ?Carollee Leitz, MD ?[place]  ?

## 2021-10-08 NOTE — Assessment & Plan Note (Addendum)
Has had no improvement with Ciprodex or antifungal treatment. ?Augmentin 875/125 mg p.o. twice daily x10 days ?Hydrocortisone ointment 1% to apply to external ear canal twice daily ?Bilateral ear culture and fungal swabs obtained ?Follow-up with ENT as scheduled May 23 ?Follow-up with PCP as needed ?Strict return precautions provided ? ?

## 2021-10-08 NOTE — Assessment & Plan Note (Addendum)
Initial blood pressure 160/100.  Repeat mains elevated at 172/95.  Was recently seen by nephrology and increased hydralazine to 75 mg 3 times daily.   History of left renal artery stenosis. ?Current blood pressure medications irbesartan 150 mg daily and hydralazine 75 mg 3 times daily.  ?Follows with [facility]. ?Follow-up PCP as needed ?Strict return precautions provided ?

## 2021-10-10 ENCOUNTER — Inpatient Hospital Stay: Admission: RE | Admit: 2021-10-10 | Payer: No Typology Code available for payment source | Source: Ambulatory Visit

## 2021-10-13 ENCOUNTER — Encounter: Payer: Self-pay | Admitting: Family Medicine

## 2021-10-13 ENCOUNTER — Other Ambulatory Visit: Payer: Self-pay | Admitting: Family Medicine

## 2021-10-13 LAB — BODY FLUID CULTURE

## 2021-10-13 MED ORDER — CEFDINIR 300 MG PO CAPS: 300.0000 mg | ORAL_CAPSULE | Freq: Every day | ORAL | 0 refills | Status: AC

## 2021-10-13 NOTE — Progress Notes (Signed)
MyChart message sent to patient.    Stop Augmentin Start Cefdinir 300 gm daily for better coverage Lt ear infection  Follow up with ENT as scheduled Follow up with PCP as needed  Carollee Leitz, MD [facility]

## 2021-10-29 ENCOUNTER — Encounter: Payer: Self-pay | Admitting: *Deleted

## 2021-11-07 LAB — FUNGUS CULTURE W SMEAR

## 2021-11-07 LAB — BODY FLUID CULTURE

## 2021-11-10 ENCOUNTER — Other Ambulatory Visit: Payer: Self-pay

## 2021-11-10 ENCOUNTER — Encounter (HOSPITAL_BASED_OUTPATIENT_CLINIC_OR_DEPARTMENT_OTHER): Payer: Self-pay

## 2021-11-10 ENCOUNTER — Encounter (HOSPITAL_COMMUNITY): Payer: Self-pay

## 2021-11-10 ENCOUNTER — Emergency Department (HOSPITAL_COMMUNITY)
Admission: EM | Admit: 2021-11-10 | Discharge: 2021-11-10 | Discharge status: Against Medical Advice | Payer: No Typology Code available for payment source | Attending: Emergency Medicine | Admitting: Emergency Medicine

## 2021-11-10 ENCOUNTER — Emergency Department (HOSPITAL_BASED_OUTPATIENT_CLINIC_OR_DEPARTMENT_OTHER)
Admission: EM | Admit: 2021-11-10 | Discharge: 2021-11-10 | Disposition: A | Discharge status: Home / Self Care | Payer: No Typology Code available for payment source | Source: Home / Self Care | Attending: Emergency Medicine | Admitting: Emergency Medicine

## 2021-11-10 DIAGNOSIS — Z79899 Other long term (current) drug therapy: Secondary | ICD-10-CM | POA: Insufficient documentation

## 2021-11-10 DIAGNOSIS — Z7902 Long term (current) use of antithrombotics/antiplatelets: Secondary | ICD-10-CM | POA: Insufficient documentation

## 2021-11-10 DIAGNOSIS — Z5321 Procedure and treatment not carried out due to patient leaving prior to being seen by health care provider: Secondary | ICD-10-CM | POA: Diagnosis not present

## 2021-11-10 DIAGNOSIS — I129 Hypertensive chronic kidney disease with stage 1 through stage 4 chronic kidney disease, or unspecified chronic kidney disease: Secondary | ICD-10-CM | POA: Insufficient documentation

## 2021-11-10 DIAGNOSIS — I10 Essential (primary) hypertension: Secondary | ICD-10-CM

## 2021-11-10 DIAGNOSIS — R739 Hyperglycemia, unspecified: Secondary | ICD-10-CM | POA: Insufficient documentation

## 2021-11-10 DIAGNOSIS — N183 Chronic kidney disease, stage 3 unspecified: Secondary | ICD-10-CM | POA: Insufficient documentation

## 2021-11-10 DIAGNOSIS — R519 Headache, unspecified: Secondary | ICD-10-CM | POA: Insufficient documentation

## 2021-11-10 DIAGNOSIS — G43909 Migraine, unspecified, not intractable, without status migrainosus: Secondary | ICD-10-CM | POA: Insufficient documentation

## 2021-11-10 LAB — CBC WITH DIFFERENTIAL/PLATELET
Abs Immature Granulocytes: 0.02 10*3/uL (ref 0.00–0.07)
Basophils Absolute: 0 10*3/uL (ref 0.0–0.1)
Basophils Relative: 0 %
Eosinophils Absolute: 0 10*3/uL (ref 0.0–0.5)
Eosinophils Relative: 0 %
HCT: 44.4 % (ref 36.0–46.0)
Hemoglobin: 14.2 g/dL (ref 12.0–15.0)
Immature Granulocytes: 0 %
Lymphocytes Relative: 20 %
Lymphs Abs: 1.7 10*3/uL (ref 0.7–4.0)
MCH: 27.3 pg (ref 26.0–34.0)
MCHC: 32 g/dL (ref 30.0–36.0)
MCV: 85.2 fL (ref 80.0–100.0)
Monocytes Absolute: 0.7 10*3/uL (ref 0.1–1.0)
Monocytes Relative: 8 %
Neutro Abs: 5.9 10*3/uL (ref 1.7–7.7)
Neutrophils Relative %: 72 %
Platelets: 252 10*3/uL (ref 150–400)
RBC: 5.21 MIL/uL — ABNORMAL HIGH (ref 3.87–5.11)
RDW: 13.6 % (ref 11.5–15.5)
WBC: 8.3 10*3/uL (ref 4.0–10.5)
nRBC: 0 % (ref 0.0–0.2)

## 2021-11-10 LAB — COMPREHENSIVE METABOLIC PANEL
ALT: 26 U/L (ref 0–44)
AST: 17 U/L (ref 15–41)
Albumin: 4.4 g/dL (ref 3.5–5.0)
Alkaline Phosphatase: 67 U/L (ref 38–126)
Anion gap: 11 (ref 5–15)
BUN: 22 mg/dL — ABNORMAL HIGH (ref 6–20)
CO2: 29 mmol/L (ref 22–32)
Calcium: 10.4 mg/dL — ABNORMAL HIGH (ref 8.9–10.3)
Chloride: 100 mmol/L (ref 98–111)
Creatinine, Ser: 2.93 mg/dL — ABNORMAL HIGH (ref 0.44–1.00)
GFR, Estimated: 18 mL/min — ABNORMAL LOW (ref >60)
Glucose, Bld: 108 mg/dL — ABNORMAL HIGH (ref 70–99)
Potassium: 3.8 mmol/L (ref 3.5–5.1)
Sodium: 140 mmol/L (ref 135–145)
Total Bilirubin: 0.6 mg/dL (ref 0.3–1.2)
Total Protein: 8.1 g/dL (ref 6.5–8.1)

## 2021-11-10 MED ORDER — SODIUM CHLORIDE 0.9 % IV BOLUS
1000.0000 mL | Freq: Once | INTRAVENOUS | Status: AC
  Administered 2021-11-10: 1000 mL via INTRAVENOUS

## 2021-11-10 MED ORDER — ACETAMINOPHEN 500 MG PO TABS
1000.0000 mg | ORAL_TABLET | Freq: Once | ORAL | Status: AC
  Administered 2021-11-10: 1000 mg via ORAL
  Filled 2021-11-10: qty 2

## 2021-11-10 MED ORDER — METOCLOPRAMIDE HCL 5 MG/ML IJ SOLN
10.0000 mg | Freq: Once | INTRAMUSCULAR | Status: AC
  Administered 2021-11-10: 10 mg via INTRAVENOUS
  Filled 2021-11-10: qty 2

## 2021-11-10 MED ORDER — HYDRALAZINE HCL 20 MG/ML IJ SOLN
20.0000 mg | Freq: Once | INTRAMUSCULAR | Status: AC
  Administered 2021-11-10: 20 mg via INTRAVENOUS
  Filled 2021-11-10: qty 1

## 2021-11-10 MED ORDER — DIPHENHYDRAMINE HCL 50 MG/ML IJ SOLN
25.0000 mg | Freq: Once | INTRAMUSCULAR | Status: AC
  Administered 2021-11-10: 25 mg via INTRAVENOUS
  Filled 2021-11-10: qty 1

## 2021-11-10 NOTE — Discharge Instructions (Signed)
It was a pleasure taking care of you today!   Your labs today didn't show any acute findings.  Call your kidney specialist to set up a follow-up appoint regarding today's ED visit.  Ensure to maintain fluid intake with water.  Take your medications as prescribed.  Return to the emergency department if you are experiencing increasing/worsening headache, vision changes, pain in chest, trouble breathing, worsening symptoms.

## 2021-11-10 NOTE — ED Triage Notes (Signed)
Patient here POV from Home.  Reports increased BP at Home for approximately 1 Week. Patient has been increasing BP Medication (Specifically Hydralazine).  BP Readings have been between 612 Systolic to 244 Systolic. States her BP Spikes even while at Rest and states Headaches have been becoming more frequent at Night.   Also endorses having History of Migraines.   NAD Noted during Triage. A&Ox4. GCS 15. Ambulatory.

## 2021-11-10 NOTE — ED Triage Notes (Signed)
Patient c/o increased BP x 1 week. Patient states her BP meds were increased 3 days ago. Patient states BP at home has been as high as 225/117. Patient states she has had a headache since 0230 today and states the BP is higher when she is resting. Patient c/o light sensitivity.

## 2021-11-10 NOTE — ED Provider Notes (Signed)
[facility] Provider Note   CSN: [id number] Arrival date & time: 11/10/21  1807     History  Chief Complaint  Patient presents with   Hypertension    [facility] is a 56 y.o. female.   Pressure over bilateral eyes and    Headache worse last night   Blurred vision today that is new for her.   The history is provided by the patient. No language interpreter was used.       Home Medications Prior to Admission medications   Medication Sig Start Date End Date Taking? Authorizing Provider  acetaminophen (TYLENOL) 500 MG tablet Take 2 tablets (1,000 mg total) by mouth every 8 (eight) hours as needed. 09/11/20   [name], DO  Blood Pressure Monitoring (BLOOD PRESSURE CUFF) MISC 1 Device by Does not apply route daily. 06/17/19   Mullis, Kiersten P, DO  clopidogrel (PLAVIX) 75 MG tablet Take 1 tablet by mouth once daily 12/21/20   [name], MD  clotrimazole ([facility] CLOTRIMAZOLE) 1 % external solution Apply 1 application. topically 2 (two) times daily. 2-3 drops in each ear, 2 times daily, for 14 days 09/28/21   Rising, Wells Guiles, PA-C  cyclobenzaprine (FLEXERIL) 10 MG tablet Take 1 tablet by mouth three times daily as needed for muscle spasm 05/21/21   [name], DO  diphenhydrAMINE (BENADRYL ALLERGY) 25 mg capsule Take 1 capsule (25 mg total) by mouth every 6 (six) hours as needed. 09/11/20   Brimage, Ronnette Juniper, DO  empagliflozin (JARDIANCE) 10 MG TABS tablet Take 1 tablet (10 mg total) by mouth daily. 03/11/21   Shary Key, DO  furosemide (LASIX) 40 MG tablet Take 1.5 tablets (60 mg total) by mouth daily. 09/11/20 06/08/21  [name], DO  gabapentin (NEURONTIN) 100 MG capsule Take 1 capsule (100 mg total) by mouth 3 (three) times daily. 09/06/21   [name], DO  hydrALAZINE (APRESOLINE) 10 MG tablet Take 7.5 tablets (75 mg total) by mouth 3 (three) times daily. 10/08/21   Carollee Leitz, MD  hydrocortisone cream 0.5 %  Apply 1 application. topically 2 (two) times daily. Use  a small amount in right ear canal twice a day for 5 days 10/08/21   Carollee Leitz, MD  irbesartan (AVAPRO) 150 MG tablet Take 1 tablet (150 mg total) by mouth daily. 08/29/20   Lurline Del, DO  ketoconazole (NIZORAL) 2 % cream Apply 1 application topically daily. For 14 days 07/23/20   [name], DO  levalbuterol [facility] HFA) 45 MCG/ACT inhaler Inhale 1-2 puffs into the lungs every 6 (six) hours as needed for wheezing. 06/19/21   Lurline Del, DO  meclizine (ANTIVERT) 12.5 MG tablet Take 1 tablet (12.5 mg total) by mouth 3 (three) times daily as needed for dizziness or nausea. 05/30/20   Matilde Haymaker, MD  oxybutynin (DITROPAN) 5 MG tablet Take 1 tablet (5 mg total) by mouth 2 (two) times daily. 01/24/21   Brimage, Ronnette Juniper, DO  pravastatin (PRAVACHOL) 40 MG tablet Take 1 tablet (40 mg total) by mouth at bedtime. 08/29/20   Lurline Del, DO  prochlorperazine (COMPAZINE) 10 MG tablet Take 1 tablet (10 mg total) by mouth every 6 (six) hours as needed for nausea or vomiting. 09/11/20   [name], DO  Vitamin D, Ergocalciferol, (DRISDOL) 1.25 MG (50000 UNIT) CAPS capsule Take 50,000 Units by mouth every Monday.  12/22/19   [provider]  fluticasone (FLONASE) 50 MCG/ACT nasal spray Place 2 sprays into both nostrils daily. 09/19/18 06/15/19  Yu, Amy V, PA-C  Galcanezumab-gnlm (EMGALITY) 120 MG/ML SOAJ Inject 120 mg into the skin every 28 (twenty-eight) days. Patient not taking: Reported on 09/25/2021 03/11/21   Pieter Partridge, DO  rizatriptan (MAXALT-MLT) 10 MG disintegrating tablet Take 1 tablet at earliest onset of migraine. May repeat in 2 hours if needed. Maximum 2 tablets in 24 hours. Patient not taking: Reported on 09/25/2021 03/11/21   Pieter Partridge, DO      Allergies    Tramadol    Review of Systems   Review of Systems  Constitutional:  Negative for chills and fever.  Eyes:  Positive for photophobia and visual disturbance  (blurred).  Respiratory:  Negative for shortness of breath.   Cardiovascular:  Negative for chest pain.  Gastrointestinal:  Negative for nausea and vomiting.  Genitourinary:  Negative for dysuria and hematuria.  Neurological:  Positive for headaches. Negative for weakness and numbness.  All other systems reviewed and are negative.   Physical Exam Updated Vital Signs BP (!) 183/97 (BP Location: Left Arm)   Pulse 71   Temp 97.8 F (36.6 C)   Resp 18   Ht 5' 9.5" (1.765 m)   Wt (!) 143.3 kg   LMP 09/22/2021 (Approximate)   SpO2 100%   BMI 45.98 kg/m  Physical Exam  ED Results / Procedures / Treatments   Labs (all labs ordered are listed, but only abnormal results are displayed) Labs Reviewed - No data to display  EKG None  Radiology No results found.  Procedures Procedures  {Document cardiac monitor, telemetry assessment procedure when appropriate:1}  Medications Ordered in ED Medications - No data to display  ED Course/ Medical Decision Making/ A&P Clinical Course as of 11/10/21 2246  Sun Nov 10, 2021  1853 100 tid hydralazine neprologist Thursday. Added spirolactone on Thursday. Headache yesterday [SB]  1856 Ubrevy at 220 am [SB]  1858 Kidney to the left stent 2021 [SB]  2022 Patient reevaluated and resting sound asleep in bed.  Noted that her migraine has improved slightly with the treatment regimen in the ED. [SB]  2118 Re-evaluated [SB]    Clinical Course User Index [SB] Mavryk Pino A, PA-C                           Medical Decision Making Amount and/or Complexity of Data Reviewed Labs: ordered.  Risk OTC drugs. Prescription drug management.   Pt presented to the ED with {sudden/gradual} onset {localization?} x *** days. {History of headaches? Vision changes, cp, UA sx, photophobia, phonophobia?}. Vital signs ***. On exam, pt with ***. No focal neurodeficits on exam.  No vision changes.  No red flags of neck pain, neck stiffness, focal neuro  deficits, or worst headache of life. Differential diagnosis includes SAH, ICH, migraine, tension headache.   Additional history obtained:  Additional history obtained from {sabhistory:27144} External records from outside source obtained and reviewed including: ***  Labs:  I ordered, and personally interpreted labs.  The pertinent results include:   ***  Imaging: I ordered imaging studies including {CT head wo contrast} *** CT imaging not indicated at this time, no red flag symptoms, no focal neurological deficits, no visual disturbances.   I independently visualized and interpreted imaging which showed: *** I agree with the radiologist interpretation  Medications:  I ordered medication including *** for *** {migraine cocktail, benadryl, reglan, IVF, toradol} Reevaluation of the patient after these medicines and interventions, I reevaluated the patient and found  that they have {resolved/improved/worsened:23923::"improved"} I have reviewed the patients home medicines and have made adjustments as needed   {Cardiac Monitoring: The patient was maintained on a cardiac monitor.  I personally viewed and interpreted the cardiac monitored which showed an underlying rhythm of: ***.   Test Considered: ***   Critical Interventions ***}   {Consultations: I requested consultation with the {sabspecialists:27145}, and discussed lab and imaging findings as well as pertinent plan - they recommend: ***}  Blood pressure at 117/66 at discharge.  Resolution of headache with treatment regimen in the ED.  Disposition: {End of MDM here with the likely diagnosis}. Presentation less likely due to [facility] or ICH due to the absence of red flags. {PO challenge tolerated?}. Patient presentation suspicious for migraine headache. After consideration of the diagnostic results and the patients response to treatment, I feel that the patient would benefit from {sabdispo:27146}. Will send prescription for ibuprofen and  Zofran to take as directed.  Discussed with patient they may follow-up with their primary care provider as needed.  Supportive care measures and strict return precautions discussed with patient.  Patient knowledges and verbalized understanding.  Patient agreeable to discharge treatment plan. Patient appears safe for discharge at this time.  Follow-up as indicated in the discharge paperwork.   This chart was dictated using voice recognition software, Dragon. Despite the best efforts of this provider to proofread and correct errors, errors may still occur which can change documentation meaning.   {Document critical care time when appropriate:1} {Document review of labs and clinical decision tools ie heart score, Chads2Vasc2 etc:1}  {Document your independent review of radiology images, and any outside records:1} {Document your discussion with family members, caretakers, and with consultants:1} {Document social determinants of health affecting pt's care:1} {Document your decision making why or why not admission, treatments were needed:1} Final Clinical Impression(s) / ED Diagnoses Final diagnoses:  None    Rx / DC Orders ED Discharge Orders     None

## 2021-11-23 ENCOUNTER — Other Ambulatory Visit: Payer: Self-pay | Admitting: Family Medicine

## 2021-11-29 ENCOUNTER — Other Ambulatory Visit: Payer: Self-pay | Admitting: Family Medicine

## 2021-12-03 ENCOUNTER — Other Ambulatory Visit (HOSPITAL_COMMUNITY): Payer: Self-pay

## 2021-12-03 ENCOUNTER — Ambulatory Visit (INDEPENDENT_AMBULATORY_CARE_PROVIDER_SITE_OTHER): Payer: No Typology Code available for payment source | Admitting: Student

## 2021-12-03 VITALS — BP 144/72 | HR 86 | Ht 69.0 in | Wt 309.0 lb

## 2021-12-03 DIAGNOSIS — H6090 Unspecified otitis externa, unspecified ear: Secondary | ICD-10-CM

## 2021-12-03 DIAGNOSIS — Z1231 Encounter for screening mammogram for malignant neoplasm of breast: Secondary | ICD-10-CM | POA: Diagnosis not present

## 2021-12-03 MED ORDER — CIPROFLOXACIN-DEXAMETHASONE 0.3-0.1 % OT SUSP: 4.0000 [drp] | Freq: Two times a day (BID) | OTIC | 0 refills | Status: AC

## 2021-12-03 NOTE — Progress Notes (Signed)
  SUBJECTIVE:   CHIEF COMPLAINT / HPI:   Chronic OE: Seen by ENT and had ear culture of LEFT EAR resulting in proteus mirabilis and stenotrophomonas maltophilia and susceptibilities; was treated with ciprodex and improved greatly   Right ear is worsening over the last several days  No drainage as of yet  Feels water in the ear Itching is present and painful at 7/10 Hearing is harder as it is with her OE in the past  PERTINENT  PMH / PSH:   Chronic OE, CKD, HTN, migraines    OBJECTIVE:  BP (!) 144/72   Pulse 86   Ht 5\' 9"  (1.753 m)   Wt (!) 309 lb (140.2 kg)   LMP 09/30/2021 (Approximate)   SpO2 98%   BMI 45.63 kg/m   General: NAD, pleasant, able to participate in exam HEENT: purulent drainage in ear canal with difficulty visualizing TM/TM dull in right. TM dull in left as well without purulent external drainage, erythematous bilaterally  Respiratory: normal WOB Abdomen: soft, non-distended,  Skin: warm and dry, no rashes noted Psych: Normal affect and mood  ASSESSMENT/PLAN:  Otitis externa We will continue with culture of the right ear today and continue with Ciprodex drops.  Depending on susceptibilities, may change in antibiotic.  Follow-up with ENT as well for chronic otitis externa.   Orders Placed This Encounter  Procedures   Gram Stain/Body Fluid Culture   Anaerobic/Aerobic/Gram Stain   MM Digital Screening    Insurance: [employer] PF: 08/17/2019 / No Problems:   / no Sx on breast /  No special  needs s/w patient/LM Tomo     Standing Status:   Future    Standing Expiration Date:   12/04/2022    Order Specific Question:   Reason for Exam (SYMPTOM  OR DIAGNOSIS REQUIRED)    Answer:   Mammogram, routine    Order Specific Question:   Is the patient pregnant?    Answer:   No    Order Specific Question:   Preferred imaging location?    Answer:   [facility]   Meds ordered this encounter  Medications   ciprofloxacin-dexamethasone (CIPRODEX) OTIC suspension     Sig: Place 4 drops into the right ear 2 (two) times daily for 7 days.    Dispense:  2.8 mL    Refill:  0   Return in about 4 weeks (around 12/31/2021) for 2-4 weeks if worsening ear infection .  Erskine Emery, MD PGY-2 Family Medicine

## 2021-12-03 NOTE — Patient Instructions (Addendum)
It was great to see you today! Thank you for choosing [facility] for your primary care. Afia Messenger was seen for follow up of ear infection.   We will continue with a medication called Ciprodex for treatment until you complete the course for 7 days  I will update you with the results of the culture as well to ensure that we are still treating appropriately   You need a mammogram to prevent breast cancer.  Please schedule an appointment.  You can call [phone].     If you haven't already, sign up for My Chart to have easy access to your labs results, and communication with your primary care physician.  We are checking some testing today. If they are abnormal, I will call you. If they are normal, I will send you a MyChart message (if it is active) or a letter in the mail. If you do not hear about your labs in the next 2 weeks, please call the office.   You should return to our clinic Return in about 4 weeks (around 12/31/2021) for 2-4 weeks if worsening ear infection .  I recommend that you always bring your medications to each appointment as this makes it easy to ensure you are on the correct medications and helps [place] not miss refills when you need them.  Please arrive 15 minutes before your appointment to ensure smooth check in process.  We appreciate your efforts in making this happen.  Please call the clinic at [phone] if your symptoms worsen or you have any concerns.  Thank you for allowing me to participate in your care, Erskine Emery, MD PGY-2 Family Medicine

## 2021-12-03 NOTE — Assessment & Plan Note (Signed)
We will continue with culture of the right ear today and continue with Ciprodex drops.  Depending on susceptibilities, may change in antibiotic.  Follow-up with ENT as well for chronic otitis externa.

## 2021-12-06 ENCOUNTER — Other Ambulatory Visit: Payer: Self-pay

## 2021-12-09 LAB — ANAEROBIC/AEROBIC/GRAM STAIN

## 2021-12-09 MED ORDER — EMPAGLIFLOZIN 10 MG PO TABS: 10.0000 mg | ORAL_TABLET | Freq: Every day | ORAL | 0 refills | Status: DC

## 2021-12-10 ENCOUNTER — Telehealth: Payer: Self-pay

## 2021-12-10 NOTE — Telephone Encounter (Signed)
A Prior Authorization was initiated for this patients JARDIANCE through CoverMyMeds.   Key: [id number]

## 2021-12-11 NOTE — Telephone Encounter (Signed)
Resubmitted PA for JARDIANCE via covermymeds. Previously denied for missing diagnosis.  Key: [id number]

## 2021-12-12 ENCOUNTER — Other Ambulatory Visit: Payer: Self-pay | Admitting: Student

## 2021-12-12 DIAGNOSIS — H6090 Unspecified otitis externa, unspecified ear: Secondary | ICD-10-CM

## 2021-12-12 NOTE — Telephone Encounter (Addendum)
Prior Auth for patients medication JARDIANCE denied by [facility] [employer] via CoverMyMeds.   Reason: Your plan only covers this drug when it is used for certain health conditions. Covered uses are type 2 diabetes and heart failure.  DENIAL LETTER SCANNED TO CHART - APPROVAL CRITERIA ON 6-7 PAGES  CoverMyMeds Key: [id number]

## 2021-12-25 ENCOUNTER — Ambulatory Visit: Payer: No Typology Code available for payment source

## 2021-12-26 ENCOUNTER — Ambulatory Visit: Payer: No Typology Code available for payment source | Admitting: Student

## 2021-12-26 NOTE — Progress Notes (Deleted)
  SUBJECTIVE:   CHIEF COMPLAINT / HPI:   Type {Blank single:19197::"1","2"} Diabetes: Last three A1C's below. Home medications include: ***. {Blank single:19197::"Does","Does not"} endorse compliance. Notes CBGs range ***. Denies hypoglycemia, polyuria, polydipsia.   Patient is on Jardiance but no other medications    Most recent A1Cs:  Lab Results  Component Value Date   HGBA1C 6.7 (H) 02/22/2021   HGBA1C 6.7 (H) 08/28/2020   HGBA1C 6.0 07/20/2019   Last Microalbumin, LDL, Creatinine: Lab Results  Component Value Date   LDLCALC 127 (H) 07/23/2020   CREATININE 2.93 (H) 11/10/2021   Patient {rwisisnot:24883} up to date on diabetic eye. Patient {rwisisnot:24883} up to date on diabetic foot exam.   Chronic OE: Ciprodex given on last visit, see last note for history.    PERTINENT  PMH / PSH:   Past Medical History:  Diagnosis Date   Acute gastritis    CHRONIC KIDNEY DISEASE STAGE III (MODERATE) 07/11/2008   Qualifier: Diagnosis of  By: Tye Savoy MD, Weston     Chronic left-sided low back pain with left-sided sciatica 07/23/2006   Qualifier: Diagnosis of  By: Herma Ard     Costochondritis, acute 06/17/2019   Essential hypertension, benign 08/30/2007   Qualifier: Diagnosis of  By: Darylene Price MD, Mark     Gout    Hypertension    Laryngitis 10/13/2019   Migraine without aura 07/11/2008   Qualifier: Diagnosis of  By: Carlena Sax  MD, Stephanie     Migraines    Obesity    OBESITY, NOS 07/23/2006   Qualifier: Diagnosis of  By: Herma Ard     Stress incontinence 06/17/2019   Vertigo     OBJECTIVE:  LMP 09/30/2021 (Approximate)   General: NAD, pleasant, able to participate in exam Cardiac: RRR, no murmurs auscultated Respiratory: CTAB, normal WOB Abdomen: soft, non-tender, non-distended, normoactive bowel sounds Extremities: warm and well perfused, no edema or cyanosis Skin: warm and dry, no rashes noted Neuro: alert, no obvious focal deficits, speech normal Psych: Normal  affect and mood  ASSESSMENT/PLAN:  No problem-specific Assessment & Plan notes found for this encounter.   No orders of the defined types were placed in this encounter.  No orders of the defined types were placed in this encounter.  No follow-ups on file. Erskine Emery, MD 12/26/2021, 12:20 PM PGY-2, [place] {    This will disappear when note is signed, click to select method of visit    :1}

## 2022-01-01 ENCOUNTER — Encounter (INDEPENDENT_AMBULATORY_CARE_PROVIDER_SITE_OTHER): Payer: Self-pay

## 2022-01-15 ENCOUNTER — Telehealth: Payer: Self-pay

## 2022-01-15 NOTE — Telephone Encounter (Signed)
Patient calls nurse line requesting a refill on Ciprodex.   Patient reports she had an apt with ENT this morning, however they cancelled stating the provider was "out." Patient has been rescheduled for 9/12.  Patient reports drainage in both ears. Denies fevers or chills.   Will forward to PCP.

## 2022-01-16 ENCOUNTER — Other Ambulatory Visit: Payer: Self-pay | Admitting: Student

## 2022-01-16 DIAGNOSIS — H6090 Unspecified otitis externa, unspecified ear: Secondary | ICD-10-CM

## 2022-01-16 MED ORDER — CIPROFLOXACIN-DEXAMETHASONE 0.3-0.1 % OT SUSP: 4.0000 [drp] | Freq: Two times a day (BID) | OTIC | 0 refills | Status: DC

## 2022-02-21 ENCOUNTER — Inpatient Hospital Stay: Admission: RE | Admit: 2022-02-21 | Payer: No Typology Code available for payment source | Source: Ambulatory Visit

## 2022-03-25 NOTE — Progress Notes (Deleted)
NEUROLOGY FOLLOW UP OFFICE NOTE  Heidi Rivera [id number]  Assessment/Plan:   Migraine with aura, without status migrainosus, not intractable    Migraine prevention:  Ajovy *** Migraine rescue:  Ubrelvy 100mg - continue Compazine for nausea *** Limit use of pain relievers to no more than 2 days out of week to prevent risk of rebound or medication-overuse headache. Keep headache diary Follow up 6 months.     Subjective:  Heidi Rivera is a 56 year old right-handed black female with HTN and CKD stage III who follows up for migraines and lumbar radiculitis.   UPDATE: Due to worsening and change in migraines, MRI was ordered but not performed.  ***  Started Ajovy Intensity:  severe Duration:  4 days Frequency:  once a week She is now experiencing visual aura prior to onset of migraines - sees lights or blurred vision.  Eye pain is now sharp.  Blood pressure has been uncontrolled for a while (180s/110s).  She cannot tolerate taking two of blood pressure medications during the day so she may not take them.     Current NSAIDS:  Tylenol Current analgesics:  none Current triptans:  none Current ergotamine:  none Current anti-emetic:  Compazine 10mg  Current muscle relaxants:  none Current anti-anxiolytic:  none Current sleep aide:  none Current Antihypertensive medications:  carvedilol, amlodipine; irbesartan Current Antidepressant medications:  none Current Anticonvulsant medications:  gabapentin 100mg  TID Current anti-CGRP:  Melodie Bouillon 100mg  *** Current Vitamins/Herbal/Supplements:  none Current Antihistamines/Decongestants:  none Other therapy:  none Hormone/birth control:  none   Caffeine:  No coffee or soda. Diet:  Water.  1 bottle of iced green tea.  Seeing a dietician.   Exercise:  Not routine Depression:  some; Anxiety:  some Other pain:  no Sleep:  Okay.  Questionable OSA as she has daytime fatigue.   HISTORY: Migraine: She has had migraines  since her early 71s.  They are severe bifrontal-temporal pressure over her eyes.  There is associated nausea, decreased appetite, photophobia, phonophobia, osmophobia and neck stiffness but no vomiting, speech disturbance, weakness or numbness.  Over the past year, she was having uncontrolled hypertension and was diagnosed with CKD stage 3.  Migraines started to become less severe but associated with visual aura of seeing bright spots in her vision.  They were lasting 3 to 4 days.  Sumatriptan is helpful, in which she gets relief in 30-60 minutes and completely resolves in about 1 to 2 days.  In past 3 weeks, she has had 3 migraines.  The smell of perfume and onions trigger them.  She was previously taking Excedrin Migraine frequently but stopped as it contributed to her kidney dysfunction.   Lumbar Radiculitis: In mid-August 2021, she developed left lower back pain.  No preceding trauma or precipitating physical activity.  Prolonged sitting aggravated it.  Pain progressively got worse, causing numbness and tingling along the lateral and anterior left thigh down lateral leg and over dorsum of foot with associated shooting pain.  No bowel or bladder dysfunction, saddle anesthesia or focal lower extremity weakness.  However, due to severe pain, she was unable to walk and was stuck on the toilet for 45 minutes because she couldn't stand.  She went to the ED on 01/13/2020 where she was prescribed Flexeril and a prednisone taper.  She is now able to ambulate but still with pain.  Started gabapentin for left lumbar radiculitis in August 2021.  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  She has since been found to have bilateral tricompartmental osteoarthritis with small knee joint effusion bilaterally.     Past NSAIDS:  Ibuprofen,  naproxen Past analgesics:  Excedrin Migraine (effective); acetaminophen; tramadol Past abortive triptans:  sumatriptan 100mg , rizatriptan 10mg  Past abortive ergotamine:  none Past muscle relaxants:  none Past anti-emetic:  Zofran Past antihypertensive medications:  none Past antidepressant medications:  Amitriptyline 10mg  (caused elevated blood pressure) Past anticonvulsant medications:  topiramate, Depakote, gabapentin Past anti-CGRP:  Aimovig 70mg  (helpful for 4-5 months), Emgality ineffective Past vitamins/Herbal/Supplements:  none Past antihistamines/decongestants:  Flonase Other past therapies:  none  PAST MEDICAL HISTORY: Past Medical History:  Diagnosis Date   Acute gastritis    CHRONIC KIDNEY DISEASE STAGE III (MODERATE) 07/11/2008   Qualifier: Diagnosis of  By: Tye Savoy MD, [place]     Chronic left-sided low back pain with left-sided sciatica 07/23/2006   Qualifier: Diagnosis of  By: Herma Ard     Costochondritis, acute 06/17/2019   Essential hypertension, benign 08/30/2007   Qualifier: Diagnosis of  By: Darylene Price MD, Mark     Gout    Hypertension    Laryngitis 10/13/2019   Migraine without aura 07/11/2008   Qualifier: Diagnosis of  By: Carlena Sax  MD, Stephanie     Migraines    Obesity    OBESITY, NOS 07/23/2006   Qualifier: Diagnosis of  By: Herma Ard     Stress incontinence 06/17/2019   Vertigo     MEDICATIONS: Current Outpatient Medications on File Prior to Visit  Medication Sig Dispense Refill   acetaminophen (TYLENOL) 500 MG tablet Take 2 tablets (1,000 mg total) by mouth every 8 (eight) hours as needed. 30 tablet 0   Blood Pressure Monitoring (BLOOD PRESSURE CUFF) MISC 1 Device by Does not apply route daily. 1 each 0   ciprofloxacin-dexamethasone (CIPRODEX) OTIC suspension Place 4 drops into both ears 2 (two) times daily. 2.8 mL 0   clopidogrel (PLAVIX) 75 MG tablet Take 1 tablet by mouth once daily 90 tablet 3   clotrimazole ([employer] CLOTRIMAZOLE) 1 % external  solution Apply 1 application. topically 2 (two) times daily. 2-3 drops in each ear, 2 times daily, for 14 days 30 mL 0   cyclobenzaprine (FLEXERIL) 10 MG tablet Take 1 tablet by mouth three times daily as needed for muscle spasm 20 tablet 0   diphenhydrAMINE (BENADRYL ALLERGY) 25 mg capsule Take 1 capsule (25 mg total) by mouth every 6 (six) hours as needed. 30 capsule 0   empagliflozin (JARDIANCE) 10 MG TABS tablet Take 1 tablet (10 mg total) by mouth daily. 30 tablet 0   furosemide (LASIX) 40 MG tablet Take 1.5 tablets (60 mg total) by mouth daily. 135 tablet 2   gabapentin (NEURONTIN) 100 MG capsule Take 1 capsule (100 mg total) by mouth 3 (three) times daily. 90 capsule 0   hydrALAZINE (APRESOLINE) 10 MG tablet Take 7.5 tablets (75 mg total) by mouth 3 (three) times daily. 90 tablet 1   hydrocortisone cream 0.5 % Apply 1 application. topically 2 (two) times daily. Use  a small amount in right ear canal twice a day for 5 days 30 g 0   irbesartan (AVAPRO) 150 MG tablet Take 1 tablet (150 mg total) by mouth daily. 30 tablet 1   ketoconazole (NIZORAL) 2 % cream Apply 1 application topically daily. For 14 days 15 g 0  levalbuterol (XOPENEX HFA) 45 MCG/ACT inhaler Inhale 1-2 puffs into the lungs every 6 (six) hours as needed for wheezing. 1 each 12   meclizine (ANTIVERT) 12.5 MG tablet Take 1 tablet (12.5 mg total) by mouth 3 (three) times daily as needed for dizziness or nausea. 30 tablet 0   oxybutynin (DITROPAN) 5 MG tablet Take 1 tablet (5 mg total) by mouth 2 (two) times daily. 30 tablet 2   pravastatin (PRAVACHOL) 40 MG tablet Take 1 tablet (40 mg total) by mouth at bedtime. 90 tablet 3   prochlorperazine (COMPAZINE) 10 MG tablet Take 1 tablet (10 mg total) by mouth every 6 (six) hours as needed for nausea or vomiting. 30 tablet 0   Vitamin D, Ergocalciferol, (DRISDOL) 1.25 MG (50000 UNIT) CAPS capsule Take 50,000 Units by mouth every Monday.      [DISCONTINUED] fluticasone (FLONASE) 50 MCG/ACT  nasal spray Place 2 sprays into both nostrils daily. 1 g 0   [DISCONTINUED] Galcanezumab-gnlm (EMGALITY) 120 MG/ML SOAJ Inject 120 mg into the skin every 28 (twenty-eight) days. (Patient not taking: Reported on 09/25/2021) 1.12 mL 5   [DISCONTINUED] rizatriptan (MAXALT-MLT) 10 MG disintegrating tablet Take 1 tablet at earliest onset of migraine. May repeat in 2 hours if needed. Maximum 2 tablets in 24 hours. (Patient not taking: Reported on 09/25/2021) 10 tablet 5   No current facility-administered medications on file prior to visit.    ALLERGIES: Allergies  Allergen Reactions   Tramadol Other (See Comments)    Intolerance : GI Upset    FAMILY HISTORY: Family History  Problem Relation Age of Onset   Diabetes Mother    Breast cancer Mother    Other Mother        Larynx cancer   Diabetes Maternal Aunt    Hypertension Father    Hypercholesterolemia Father    Other Father        DDD   Cancer - Other Paternal Grandmother        bone   Colon cancer Maternal Uncle       Objective:  *** General: No acute distress.  Patient appears well-groomed.   Heart:  RRR Neurological:  ***    Metta Clines, DO  CC: [name], DO

## 2022-03-28 ENCOUNTER — Ambulatory Visit: Payer: No Typology Code available for payment source | Admitting: Neurology

## 2022-03-28 ENCOUNTER — Encounter: Payer: Self-pay | Admitting: Neurology

## 2022-03-28 DIAGNOSIS — Z029 Encounter for administrative examinations, unspecified: Secondary | ICD-10-CM

## 2022-05-01 ENCOUNTER — Encounter: Payer: Self-pay | Admitting: Family Medicine

## 2022-05-01 ENCOUNTER — Other Ambulatory Visit: Payer: Self-pay

## 2022-05-01 ENCOUNTER — Ambulatory Visit (INDEPENDENT_AMBULATORY_CARE_PROVIDER_SITE_OTHER): Payer: No Typology Code available for payment source | Admitting: Family Medicine

## 2022-05-01 VITALS — BP 191/89 | HR 67 | Wt 316.4 lb

## 2022-05-01 DIAGNOSIS — I10 Essential (primary) hypertension: Secondary | ICD-10-CM

## 2022-05-01 DIAGNOSIS — M5432 Sciatica, left side: Secondary | ICD-10-CM

## 2022-05-01 DIAGNOSIS — M5431 Sciatica, right side: Secondary | ICD-10-CM

## 2022-05-01 DIAGNOSIS — M5430 Sciatica, unspecified side: Secondary | ICD-10-CM

## 2022-05-01 HISTORY — DX: Sciatica, unspecified side: M54.30

## 2022-05-01 NOTE — Patient Instructions (Signed)
It was great to see you today! Thank you for choosing [facility] for your primary care. Heidi Rivera was seen for back pain.  Today we addressed: Back pain - You have sciatica. This is a back pain mainly caused by your inflamed nerves. Continue to use heat and icy hot. You can take 200 mg of gabapentin 3 times a day. Decrease if you start to feel sleepy or like you are drunk. I have also placed a physical therapy referral for you. They should be in contact with you soon.  2. Hypertension - Please take your hydral consistently. It is dangerous for your blood pressure to be so high.    If you haven't already, sign up for My Chart to have easy access to your labs results, and communication with your primary care physician.  We are checking some labs today. If they are abnormal, I will call you. If they are normal, I will send you a MyChart message (if it is active) or a letter in the mail. If you do not hear about your labs in the next 2 weeks, please call the office.   You should return to our clinic No follow-ups on file.  I recommend that you always bring your medications to each appointment as this makes it easy to ensure you are on the correct medications and helps [place] not miss refills when you need them.  Please arrive 15 minutes before your appointment to ensure smooth check in process.  We appreciate your efforts in making this happen.  Please call the clinic at [phone] if your symptoms worsen or you have any concerns.  Thank you for allowing me to participate in your care, Lowry Ram, MD 05/01/2022, 3:35 PM PGY-1, [place]

## 2022-05-01 NOTE — Assessment & Plan Note (Signed)
History of essential hypertension associated CKD 4.  Patient has home regimen of irbesartan, hydralazine.  Has not taken her blood pressure medications today as she has been taking care of her terminally ill brother.  Hypertension today most likely in the setting of pain, rebound hypertension from missing hydralazine.  No red flag symptoms of hypertensive emergency. -Strongly encouraged to take hypertensive medications when she goes home. - Educated regarding hypertensive emergency - Will message PCP regarding uncontrolled hypertension. - Recommended following up in 4 weeks

## 2022-05-01 NOTE — Assessment & Plan Note (Signed)
Patient with a week of back pain that is positive for sciatica including positive straight leg test, pain going down her legs, burning back pain.  No red flag symptoms able to put chin to her chest no point tenderness on her spine.  She is afebrile.  Feels like her previous sciatica pain. 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

## 2022-05-01 NOTE — Progress Notes (Signed)
    SUBJECTIVE:   CHIEF COMPLAINT / HPI:   Back Pain  Has flexeril prescription. She thinks they are not working. She has been taking one pill a day for 3 days because she gets sleepy after taking one. She tried a lidocaine patch that her friend gave her and that did not work. She also tried heat and ice without help. She thinks the pain is sciatica. She says this episode of back pain has been going on for about a week. She feels stiffening and she feels like if she moves a certain way like her back will pop. She feels it going down her legs, and some burning in her lower back. Urinating and defecating without issues. Denies weakness in her legs. She has fallen and fell on her back almost 2 years ago. Her back pain started before this incident. She has had these back pain episodes before. She said in the hospital she had a shot. She is not sure where the shot was but after the shot and rest she felt better.  Patient has prescription for gabapentin but says that she has not been taking it.  Hypertension  Patient denies chest pain, shortness of breath, palpitations, vision changes, or headache.  She says that she did not take any of her blood pressure medicines today.  She says that her blood pressure is probably high as she is in pain, and has been taking care of her terminally ill brother.  This has been causing her a lot of stress and she was taking care of him this morning thus she has not taken any of her blood pressure medications today (which are irbesartan and hydralazine).   PERTINENT  PMH / PSH: Migraines, No osteoporosis, No surgical history, Mild OA in knees   OBJECTIVE:   BP (!) 191/89   Pulse 67   Wt (!) 316 lb 6.4 oz (143.5 kg)   SpO2 97%   BMI 46.72 kg/m   General: Uncomfortable appearing Neuro: EOMI, PERRLA, no nuchal rigidity, no point tenderness in thoracic or lumbar region, normal strength no focal deficits CV: RRR, well-perfused Respiratory: Normal work of breathing on  room air Abdomen: Soft, nontender, nondistended MSK: Paravertebral lumbar back pain on palpation, positive straight leg sciatica test, negative Kernig sign, decreased pain when walking with slightly bent or hunched gait  ASSESSMENT/PLAN:   Sciatica Patient with a week of back pain that is positive for sciatica including positive straight leg test, pain going down her legs, burning back pain.  No red flag symptoms able to put chin to her chest no point tenderness on her spine.  She is afebrile.  Feels like her previous sciatica pain. 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  Patient has home regimen of irbesartan, hydralazine.  Has not taken her blood pressure medications today as she has been taking care of her terminally ill brother.  Hypertension today most likely in the setting of pain, rebound hypertension from missing hydralazine.  No red flag symptoms of hypertensive emergency. -Strongly encouraged to take hypertensive medications when she goes home. - Educated regarding hypertensive emergency - Will message PCP regarding uncontrolled hypertension. - Recommended following up in 4 weeks     Lowry Ram, MD [place]

## 2022-06-26 ENCOUNTER — Ambulatory Visit (INDEPENDENT_AMBULATORY_CARE_PROVIDER_SITE_OTHER): Payer: BLUE CROSS/BLUE SHIELD | Admitting: Family Medicine

## 2022-06-26 ENCOUNTER — Encounter: Payer: Self-pay | Admitting: Family Medicine

## 2022-06-26 VITALS — BP 189/107 | HR 51 | Ht 69.0 in | Wt 314.0 lb

## 2022-06-26 DIAGNOSIS — M79652 Pain in left thigh: Secondary | ICD-10-CM

## 2022-06-26 DIAGNOSIS — M62838 Other muscle spasm: Secondary | ICD-10-CM | POA: Diagnosis not present

## 2022-06-26 DIAGNOSIS — M79662 Pain in left lower leg: Secondary | ICD-10-CM

## 2022-06-26 MED ORDER — TIZANIDINE HCL 4 MG PO TABS: 4.0000 mg | ORAL_TABLET | Freq: Four times a day (QID) | ORAL | 0 refills | Status: AC | PRN

## 2022-06-26 NOTE — Patient Instructions (Signed)
Heidi Rivera - I believe you have strained your Hamstring in the back of your left leg.  I also think you have a tender muscle point in your calf. Both should respond to physical therapy with help from a muscle spasm medication.  Please start Tizanidine (Zanaflex), one tablet every six hours as needed for muscle spasm and pain.   The Physical Therapy office will call you to set up your first appointment with them.   If you are unable to work at your new job because of this pain, please let me know as you may require some temporary work limitations.  Hamstring Strain  A hamstring strain happens when the muscles in the back of the thighs (hamstring muscles) are overstretched or torn. The hamstring muscles are used in straightening the hips, bending the knees, and pulling back the legs. This injury is often called a pulled hamstring muscle. The tissue that connects the muscle to a bone (tendon) may also be affected. The severity of a hamstring strain may be rated in degrees or grades. First-degree (or grade 1) strains have the least amount of muscle tearing and pain. Second-degree and third-degree (grade 2 and 3) strains have increasingly more tearing and pain. What are the causes? This condition is caused by a sudden, violent force being placed on the hamstring muscles, stretching them too far. This often happens during activities that involve sprinting, jumping, kicking, or weight lifting. What increases the risk? Hamstring strains are especially common in athletes. The following factors may also make you more likely to develop this condition: Having low strength, endurance, or flexibility of the hamstring muscles. Doing high-impact physical activity or sports. Having poor physical fitness. Having a previous leg injury. Having tired (fatigued) muscles. Having a previous hamstring strain. What are the signs or symptoms? Symptoms of this condition include: Pain in the back of the thigh or  buttocks. Swelling. Bruising. Muscle spasms. Trouble moving the affected muscle because of pain. For severe strains, you may feel popping or snapping in the back of your thigh when the injury occurs. How is this diagnosed? This condition is diagnosed based on your symptoms, your medical history, and a physical exam. You may also have imaging tests, such as an MRI or X-rays. Your strain may be rated based on how severe it is. The ratings are: Grade 1 strain (mild). Muscles are overstretched. There may be very small muscle tears. Grade 2 strain (moderate). Muscles are partially torn. Grade 3 strain (severe). Muscles are completely torn. How is this treated? Treatment for this condition usually involves: Protecting, resting, icing, applying compression, and elevating the injured area (PRICE therapy). Medicines. Your health care provider may recommend medicines to help reduce pain or inflammation. Doing exercises to regain strength and flexibility in the muscles. Your health care provider will tell you when it is okay to begin exercising. Hamstring strains may take a long time to heal. This type of strain may happen again in athletes. Follow your health care provider's advice about when to return to sports-related activities. Follow these instructions at home: PRICE therapy Use PRICE therapy to promote muscle healing during the first 2-3 days after your injury, or as told by your health care provider. Protect the muscle from being injured again. Rest your injury. This usually involves limiting your normal activities and not using the injured hamstring muscle. Talk with your health care provider about how you should limit your activities. Apply ice to the injured area: Put ice in a plastic bag.  Place a towel between your skin and the bag. Leave the ice on for 20 minutes, 2-3 times a day. After the third day, switch to applying heat as told. Put pressure (compression) on your injured hamstring  by wrapping it with an elastic bandage. Be careful not to wrap it too tightly. That may interfere with blood circulation or may increase swelling. Raise (elevate) your injured hamstring above the level of your heart as often as possible. When you are lying down, you can do this by putting a pillow under your thigh.  Activity Begin exercising or stretching only as told by your health care provider. Do not return to full activity level until your health care provider approves. To help prevent muscle strains in the future, always warm up before exercising and stretch afterward. General instructions Take over-the-counter and prescription medicines only as told by your health care provider. If directed, apply heat to the affected area as often as told by your health care provider. Use the heat source that your health care provider recommends, such as a moist heat pack or a heating pad. Place a towel between your skin and the heat source. Leave the heat on for 20-30 minutes. Remove the heat if your skin turns bright red. This is especially important if you are unable to feel pain, heat, or cold. You may have a greater risk of getting burned. Keep all follow-up visits. This is important. Contact a health care provider if: You have increasing pain or swelling in the injured area. You have numbness, tingling, or a significant loss of strength in the injured area. Get help right away if: Your foot or your toes become cold or turn blue. Summary A hamstring strain happens when the muscles in the back of the thighs (hamstring muscles) are overstretched or torn. This injury can be caused by a sudden, violent force being placed on the hamstring muscles, causing them to stretch too far. Symptoms include pain, swelling, and muscle spasms in the injured area. Treatment includes PRICE therapy: protecting, resting, icing, applying compression, and elevating the injured area. This information is not intended to  replace advice given to you by your health care provider. Make sure you discuss any questions you have with your health care provider.

## 2022-06-27 ENCOUNTER — Encounter: Payer: Self-pay | Admitting: Family Medicine

## 2022-06-27 DIAGNOSIS — M79652 Pain in left thigh: Secondary | ICD-10-CM | POA: Insufficient documentation

## 2022-06-27 DIAGNOSIS — M170 Bilateral primary osteoarthritis of knee: Secondary | ICD-10-CM | POA: Insufficient documentation

## 2022-06-27 NOTE — Progress Notes (Signed)
Heidi Rivera is alone Sources of clinical information for visit is/are patient. Nursing assessment for this office visit was reviewed with the patient for accuracy and revision.     Previous Report(s) Reviewed: x-ray reports Tricompartmental OA bilateral knees     06/26/2022    9:31 AM  Depression screen PHQ 2/9  Decreased Interest 1  Down, Depressed, Hopeless 2  PHQ - 2 Score 3  Altered sleeping 2  Tired, decreased energy 3  Change in appetite 0  Feeling bad or failure about yourself  0  Trouble concentrating 0  Moving slowly or fidgety/restless 0  Suicidal thoughts 0  PHQ-9 Score 8   [place] Office Visit from 06/26/2022 in [place] Office Visit from 05/01/2022 in [place] Office Visit from 12/03/2021 in [place]  Thoughts that you would be better off dead, or of hurting yourself in some way Not at all Not at all Not at all  PHQ-9 Total Score 8 4 3           05/01/2022    3:04 PM 01/23/2020    1:31 PM 09/13/2019    8:28 AM 07/05/2019    3:05 PM 06/17/2019    2:30 PM  Alligator in the past year? 0 0 0 0 0  Number falls in past yr: 0 0 0    Injury with Fall? 0 0 0    Risk for fall due to :   No Fall Risks         06/26/2022    9:31 AM 05/01/2022    3:04 PM 12/03/2021    3:37 PM  PHQ9 SCORE ONLY  PHQ-9 Total Score 8 4 3     There are no preventive care reminders to display for this patient.  Health Maintenance Due  Topic Date Due   Zoster Vaccines- Shingrix (1 of 2) Never done   MAMMOGRAM  08/16/2021   PAP SMEAR-Modifier  07/19/2022      History/P.E. limitations: none  There are no preventive care reminders to display for this patient. There are no preventive care reminders to display for this patient.  Health Maintenance Due  Topic Date Due   Zoster Vaccines- Shingrix (1 of 2) Never done   MAMMOGRAM  08/16/2021   PAP SMEAR-Modifier  07/19/2022     Chief Complaint   Patient presents with   Leg Pain    left     --------------------------------------------------------------------------------------------------------------------------------------------- Visit Problem List with A/P  No problem-specific Assessment & Plan notes found for this encounter.

## 2022-06-27 NOTE — Assessment & Plan Note (Addendum)
New complaint though has had office visit for bilateral calf pain and left sided sciatica in 2021 and 2022 Onset: within the week Location: mostly in back of left knee Quality: aching Severity: mild to moderate Function: impairing sleep, prolonged standing and walking Pattern: wax and wanes Course: stable Radiation: no Relief: rubbing voltaren gel and occasional ibuprofen (though she knws she is not suppose to take it with her Chronic Kidney Disease. 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  5/5 ankle DF/PF  Suspect a tendonopathy of left leg lateral hamstring and trigger point in left lateral calf.  Recommend APAP, topical diclofenac, and referral to physical therapy Prescription for tizanidine sent

## 2022-06-30 DIAGNOSIS — I129 Hypertensive chronic kidney disease with stage 1 through stage 4 chronic kidney disease, or unspecified chronic kidney disease: Secondary | ICD-10-CM | POA: Diagnosis not present

## 2022-07-31 ENCOUNTER — Ambulatory Visit (INDEPENDENT_AMBULATORY_CARE_PROVIDER_SITE_OTHER): Payer: Medicaid Other | Admitting: Family Medicine

## 2022-07-31 ENCOUNTER — Encounter: Payer: Self-pay | Admitting: Family Medicine

## 2022-07-31 ENCOUNTER — Telehealth: Payer: Self-pay

## 2022-07-31 VITALS — BP 162/90 | HR 54 | Wt 309.4 lb

## 2022-07-31 DIAGNOSIS — G2581 Restless legs syndrome: Secondary | ICD-10-CM

## 2022-07-31 DIAGNOSIS — M79604 Pain in right leg: Secondary | ICD-10-CM | POA: Diagnosis not present

## 2022-07-31 DIAGNOSIS — M79605 Pain in left leg: Secondary | ICD-10-CM | POA: Diagnosis not present

## 2022-07-31 DIAGNOSIS — I1A0 Resistant hypertension: Secondary | ICD-10-CM | POA: Diagnosis not present

## 2022-07-31 DIAGNOSIS — H60501 Unspecified acute noninfective otitis externa, right ear: Secondary | ICD-10-CM | POA: Diagnosis not present

## 2022-07-31 DIAGNOSIS — R200 Anesthesia of skin: Secondary | ICD-10-CM

## 2022-07-31 DIAGNOSIS — H9201 Otalgia, right ear: Secondary | ICD-10-CM

## 2022-07-31 LAB — POCT GLYCOSYLATED HEMOGLOBIN (HGB A1C): HbA1c, POC (controlled diabetic range): 6.1 % (ref 0.0–7.0)

## 2022-07-31 MED ORDER — CIPROFLOXACIN-DEXAMETHASONE 0.3-0.1 % OT SUSP: 4.0000 [drp] | Freq: Two times a day (BID) | OTIC | 0 refills | Status: AC

## 2022-07-31 MED ORDER — PREGABALIN 25 MG PO CAPS: 25.0000 mg | ORAL_CAPSULE | Freq: Every day | ORAL | 0 refills | Status: DC

## 2022-07-31 MED ORDER — AMLODIPINE BESYLATE 5 MG PO TABS: 5.0000 mg | ORAL_TABLET | Freq: Every day | ORAL | 0 refills | Status: DC

## 2022-07-31 NOTE — Telephone Encounter (Signed)
Patient LVM on nurse line reporting Pregabalin needs a prior authorization.   I called the pharmacy to confirm.   Will forward to [place].

## 2022-07-31 NOTE — Patient Instructions (Signed)
It was wonderful to see you today.  Please bring ALL of your medications with you to every visit.   Today we talked about:  Blood pressure - We are going to start amlodipine at 5 mg daily. It should take about two days to start working.   Ear pain - I am prescribing ear drops for 7 days. Please call ENT to follow up.   Restless leg - I am prescribing lyrica and getting some labs. I'll let you know of the results. Also referring you to podiatry to do monofilament testing.   Please follow up in 2 months   Thank you for choosing [facility].   Please call [phone] with any questions about today's appointment.  Please be sure to schedule follow up at the front desk before you leave today.   Lowry Ram, MD  Family Medicine

## 2022-07-31 NOTE — Progress Notes (Signed)
    SUBJECTIVE:   CHIEF COMPLAINT / HPI:   Blood Pressure  Last saw nephrologist on February 5th. She goes back on the 15th. Notices that she gets headaches after taking hydralazine. However, has stayed consistent with her medications.   Ear pain  Has had recurring ear infections. Seems that it got better after 3-4 months. A couple of weeks. Does not swim. Has tried ear plugs when she gets her hair shampooed. Otherwise tries to dry her ears well after getting out of the shower. However, she mentions that last couple weeks her ears have been very itchy and painful, especially the right ear. Has not notices any discharge.   Restless leg  More prevalent on right side. She sleeps on her side. She can't lay on that side for more than 5 mins and her whole leg will be numb. Says that she feels like she has to continuously massage her legs.   Numbness of right foot  Patient mentions that for the past month she has been having numbness as well as sharp pins and needles in her inner right foot. She says that it hurts first thing in the morning as well as throughout the night and is numb whenever she is standing for a long time. Nothing helps. Denies any trauma.   PERTINENT  PMH / PSH: Recurrent ear infections   OBJECTIVE:   BP (!) 162/90   Pulse (!) 54   Wt (!) 309 lb 6.4 oz (140.3 kg)   SpO2 99%   BMI 45.69 kg/m   General: well appearing, in no acute distress HEENT: right ear with mucopurulent discharge in the inner auricle CV: RRR, radial pulses equal and palpable, no BLE edema  Resp: Normal work of breathing on room air, CTAB Abd: Soft, non tender, non distended  Neuro: Alert & Oriented x 4, decreased sensation of medial plantar surface of right foot compared to left and beneath medial malleolus, strength and proprioception were equal in both legs.    ASSESSMENT/PLAN:   Hypertension Uncontrolled, is being followed by cardiology and nephrology. Is on lasix 60 BID, hydral 100 TID. Is  most likely having headaches due to hydral. Was on irbesartan before, but most likely taken off due to decreasing kidney function.  - Start amlodipine 5 mg  - F/u with nephrology   Otitis externa Patient has recurring otitis externa. Has been positive for proteus in the past. Has mucopurulent discharge in the ear today.  - Ciprodex otic drops for one week  - F/u with ENT   Lower extremity pain Reports side effects (not anaphylaxis) with gabapentin, cannot remember exactly what the side effect was. Could be restless leg syndrome due to iron deficiency, or neuropathy due to T2DM, CKD.  - A1c, CBC, TSH, B12 today  - lyrica 25 mg nightly  - recommend hot showers and massages   Numbness of right foot Patient does seem to have sensory deficit of medial right foot. Could be due to diabetes. Patient has never seen podiatry before. There are no ulcers or lesions. Patient does not have any other weakness or strength deficits.  - Referral to podiatry  - F/u TSH, B12, CBC  - Consider nerve studies if it does not improve.      Lowry Ram, MD [place]

## 2022-08-01 ENCOUNTER — Ambulatory Visit (HOSPITAL_COMMUNITY)
Admission: RE | Admit: 2022-08-01 | Discharge: 2022-08-01 | Disposition: A | Discharge status: Home / Self Care | Payer: Medicaid Other | Source: Ambulatory Visit | Attending: Cardiovascular Disease | Admitting: Cardiovascular Disease

## 2022-08-01 DIAGNOSIS — Z9889 Other specified postprocedural states: Secondary | ICD-10-CM | POA: Diagnosis not present

## 2022-08-01 DIAGNOSIS — Z95828 Presence of other vascular implants and grafts: Secondary | ICD-10-CM | POA: Diagnosis not present

## 2022-08-01 LAB — TSH RFX ON ABNORMAL TO FREE T4: TSH: 1.93 u[IU]/mL (ref 0.450–4.500)

## 2022-08-01 LAB — CBC WITH DIFFERENTIAL/PLATELET
Basophils Absolute: 0 10*3/uL (ref 0.0–0.2)
Basos: 0 %
EOS (ABSOLUTE): 0.1 10*3/uL (ref 0.0–0.4)
Eos: 2 %
Hematocrit: 42.4 % (ref 34.0–46.6)
Hemoglobin: 13.7 g/dL (ref 11.1–15.9)
Immature Grans (Abs): 0 10*3/uL (ref 0.0–0.1)
Immature Granulocytes: 0 %
Lymphocytes Absolute: 2.3 10*3/uL (ref 0.7–3.1)
Lymphs: 42 %
MCH: 27.3 pg (ref 26.6–33.0)
MCHC: 32.3 g/dL (ref 31.5–35.7)
MCV: 85 fL (ref 79–97)
Monocytes Absolute: 0.4 10*3/uL (ref 0.1–0.9)
Monocytes: 8 %
Neutrophils Absolute: 2.7 10*3/uL (ref 1.4–7.0)
Neutrophils: 48 %
Platelets: 224 10*3/uL (ref 150–450)
RBC: 5.02 x10E6/uL (ref 3.77–5.28)
RDW: 13.5 % (ref 11.7–15.4)
WBC: 5.5 10*3/uL (ref 3.4–10.8)

## 2022-08-01 LAB — VITAMIN B12: Vitamin B-12: 361 pg/mL (ref 232–1245)

## 2022-08-01 NOTE — Telephone Encounter (Signed)
PA has been submitted through covermymeds.   Key: [id number]

## 2022-08-02 DIAGNOSIS — R200 Anesthesia of skin: Secondary | ICD-10-CM | POA: Insufficient documentation

## 2022-08-02 NOTE — Assessment & Plan Note (Signed)
Uncontrolled, is being followed by cardiology and nephrology. Is on lasix 60 BID, hydral 100 TID. Is most likely having headaches due to hydral. Was on irbesartan before, but most likely taken off due to decreasing kidney function.  - Start amlodipine 5 mg  - F/u with nephrology

## 2022-08-02 NOTE — Assessment & Plan Note (Signed)
Reports side effects (not anaphylaxis) with gabapentin, cannot remember exactly what the side effect was. Could be restless leg syndrome due to iron deficiency, or neuropathy due to T2DM, CKD.  - A1c, CBC, TSH, B12 today  - lyrica 25 mg nightly  - recommend hot showers and massages

## 2022-08-02 NOTE — Assessment & Plan Note (Signed)
Patient does seem to have sensory deficit of medial right foot. Could be due to diabetes. Patient has never seen podiatry before. There are no ulcers or lesions. Patient does not have any other weakness or strength deficits.  - Referral to podiatry  - F/u TSH, B12, CBC  - Consider nerve studies if it does not improve.

## 2022-08-02 NOTE — Assessment & Plan Note (Signed)
Patient has recurring otitis externa. Has been positive for proteus in the past. Has mucopurulent discharge in the ear today.  - Ciprodex otic drops for one week  - F/u with ENT

## 2022-08-04 ENCOUNTER — Encounter (HOSPITAL_COMMUNITY): Payer: Self-pay

## 2022-08-04 ENCOUNTER — Telehealth: Payer: Self-pay

## 2022-08-04 ENCOUNTER — Encounter (HOSPITAL_COMMUNITY): Payer: Self-pay | Admitting: Emergency Medicine

## 2022-08-04 ENCOUNTER — Ambulatory Visit (HOSPITAL_COMMUNITY)
Admission: EM | Admit: 2022-08-04 | Discharge: 2022-08-04 | Disposition: A | Discharge status: Home / Self Care | Payer: Medicaid Other | Attending: Internal Medicine | Admitting: Internal Medicine

## 2022-08-04 ENCOUNTER — Other Ambulatory Visit: Payer: Self-pay

## 2022-08-04 ENCOUNTER — Emergency Department (HOSPITAL_COMMUNITY)
Admission: EM | Admit: 2022-08-04 | Discharge: 2022-08-04 | Disposition: A | Discharge status: Home / Self Care | Payer: Medicaid Other | Attending: Emergency Medicine | Admitting: Emergency Medicine

## 2022-08-04 ENCOUNTER — Emergency Department (HOSPITAL_COMMUNITY): Payer: Medicaid Other

## 2022-08-04 DIAGNOSIS — I129 Hypertensive chronic kidney disease with stage 1 through stage 4 chronic kidney disease, or unspecified chronic kidney disease: Secondary | ICD-10-CM | POA: Insufficient documentation

## 2022-08-04 DIAGNOSIS — R519 Headache, unspecified: Secondary | ICD-10-CM | POA: Diagnosis not present

## 2022-08-04 DIAGNOSIS — N189 Chronic kidney disease, unspecified: Secondary | ICD-10-CM | POA: Diagnosis not present

## 2022-08-04 DIAGNOSIS — R079 Chest pain, unspecified: Secondary | ICD-10-CM | POA: Diagnosis not present

## 2022-08-04 DIAGNOSIS — I161 Hypertensive emergency: Secondary | ICD-10-CM

## 2022-08-04 DIAGNOSIS — Z7901 Long term (current) use of anticoagulants: Secondary | ICD-10-CM | POA: Diagnosis not present

## 2022-08-04 DIAGNOSIS — Z7982 Long term (current) use of aspirin: Secondary | ICD-10-CM | POA: Diagnosis not present

## 2022-08-04 DIAGNOSIS — R0789 Other chest pain: Secondary | ICD-10-CM | POA: Diagnosis not present

## 2022-08-04 LAB — BASIC METABOLIC PANEL
Anion gap: 7 (ref 5–15)
BUN: 16 mg/dL (ref 6–20)
CO2: 27 mmol/L (ref 22–32)
Calcium: 9.2 mg/dL (ref 8.9–10.3)
Chloride: 103 mmol/L (ref 98–111)
Creatinine, Ser: 2.54 mg/dL — ABNORMAL HIGH (ref 0.44–1.00)
GFR, Estimated: 21 mL/min — ABNORMAL LOW (ref >60)
Glucose, Bld: 121 mg/dL — ABNORMAL HIGH (ref 70–99)
Potassium: 4.2 mmol/L (ref 3.5–5.1)
Sodium: 137 mmol/L (ref 135–145)

## 2022-08-04 LAB — CBC
HCT: 44.2 % (ref 36.0–46.0)
Hemoglobin: 14.5 g/dL (ref 12.0–15.0)
MCH: 27.8 pg (ref 26.0–34.0)
MCHC: 32.8 g/dL (ref 30.0–36.0)
MCV: 84.8 fL (ref 80.0–100.0)
Platelets: 270 10*3/uL (ref 150–400)
RBC: 5.21 MIL/uL — ABNORMAL HIGH (ref 3.87–5.11)
RDW: 13.6 % (ref 11.5–15.5)
WBC: 6.6 10*3/uL (ref 4.0–10.5)
nRBC: 0 % (ref 0.0–0.2)

## 2022-08-04 LAB — TROPONIN I (HIGH SENSITIVITY)
Troponin I (High Sensitivity): 9 ng/L (ref <18)
Troponin I (High Sensitivity): 12 ng/L (ref <18)

## 2022-08-04 LAB — I-STAT BETA HCG BLOOD, ED (MC, WL, AP ONLY): I-stat hCG, quantitative: <5 m[IU]/mL (ref <5)

## 2022-08-04 LAB — MAGNESIUM: Magnesium: 2.2 mg/dL (ref 1.7–2.4)

## 2022-08-04 MED ORDER — TETRACAINE HCL 0.5 % OP SOLN
1.0000 [drp] | Freq: Once | OPHTHALMIC | Status: AC
  Administered 2022-08-04: 1 [drp] via OPHTHALMIC
  Filled 2022-08-04: qty 4

## 2022-08-04 MED ORDER — LACTATED RINGERS IV BOLUS
500.0000 mL | Freq: Once | INTRAVENOUS | Status: AC
  Administered 2022-08-04: 500 mL via INTRAVENOUS

## 2022-08-04 MED ORDER — HYDRALAZINE HCL 20 MG/ML IJ SOLN
10.0000 mg | Freq: Once | INTRAMUSCULAR | Status: AC
  Administered 2022-08-04: 10 mg via INTRAVENOUS
  Filled 2022-08-04: qty 1

## 2022-08-04 MED ORDER — OXYCODONE HCL 5 MG PO TABS
5.0000 mg | ORAL_TABLET | Freq: Once | ORAL | Status: AC
  Administered 2022-08-04: 5 mg via ORAL
  Filled 2022-08-04: qty 1

## 2022-08-04 MED ORDER — FENTANYL CITRATE PF 50 MCG/ML IJ SOSY
50.0000 ug | PREFILLED_SYRINGE | Freq: Once | INTRAMUSCULAR | Status: AC
  Administered 2022-08-04: 50 ug via INTRAVENOUS
  Filled 2022-08-04: qty 1

## 2022-08-04 MED ORDER — AMLODIPINE BESYLATE 5 MG PO TABS
5.0000 mg | ORAL_TABLET | Freq: Once | ORAL | Status: AC
  Administered 2022-08-04: 5 mg via ORAL
  Filled 2022-08-04: qty 1

## 2022-08-04 MED ORDER — HYDRALAZINE HCL 20 MG/ML IJ SOLN
5.0000 mg | Freq: Once | INTRAMUSCULAR | Status: AC
  Administered 2022-08-04: 5 mg via INTRAVENOUS
  Filled 2022-08-04: qty 1

## 2022-08-04 MED ORDER — PROCHLORPERAZINE EDISYLATE 10 MG/2ML IJ SOLN
10.0000 mg | Freq: Once | INTRAMUSCULAR | Status: AC
  Administered 2022-08-04: 10 mg via INTRAVENOUS
  Filled 2022-08-04: qty 2

## 2022-08-04 MED ORDER — METOCLOPRAMIDE HCL 5 MG/ML IJ SOLN
10.0000 mg | Freq: Once | INTRAMUSCULAR | Status: AC
  Administered 2022-08-04: 10 mg via INTRAVENOUS
  Filled 2022-08-04: qty 2

## 2022-08-04 MED ORDER — DIPHENHYDRAMINE HCL 50 MG/ML IJ SOLN
12.5000 mg | Freq: Once | INTRAMUSCULAR | Status: AC
  Administered 2022-08-04: 12.5 mg via INTRAVENOUS
  Filled 2022-08-04: qty 1

## 2022-08-04 MED ORDER — ACETAMINOPHEN 500 MG PO TABS
1000.0000 mg | ORAL_TABLET | Freq: Once | ORAL | Status: AC
  Administered 2022-08-04: 1000 mg via ORAL
  Filled 2022-08-04: qty 2

## 2022-08-04 NOTE — ED Provider Notes (Signed)
Patient signed to me by Dr. Doren Custard pending response to headache medications.  Patient medicated feels better at this time.  Request be discharged.  Her neurological exam is stable   Lacretia Leigh, MD 08/04/22 1940

## 2022-08-04 NOTE — Discharge Instructions (Signed)
Please go to the emergency department for further evaluation.

## 2022-08-04 NOTE — ED Notes (Signed)
Pt medicated per mar NAD

## 2022-08-04 NOTE — Telephone Encounter (Signed)
Patient calls nurse line reporting high blood pressure.   She reports this morning around ~2am her BP was 220/116. She reports she had a "severe" headache all night and into this morning. She reports she had to sit up right to attempt sleep.   Patient reports compliance with Hydralazine and Amlodipine. Patient asked to check BP while on the phone. Patient reports 197/117. She endorses a headache and blurry vision.   She denies any dizziness, chest pains or SOB.   Patient advised to go to the ED or an UC for blood pressure control.   Patient agreed with plan.

## 2022-08-04 NOTE — ED Triage Notes (Signed)
Pt is here for elevated b/p , chest pain, tingling on the left arm, blurry vision, pain behind the left eye  x 1wk

## 2022-08-04 NOTE — ED Notes (Signed)
Patient is being discharged from the Urgent Care and sent to the Emergency Department via car . Per Dr. Gerrit Friends, patient is in need of higher level of care due to HYPERTENSION AND CHEST PAIN. Patient is aware and verbalizes understanding of plan of care.  Vitals:   08/04/22 1001  BP: (!) 205/101  Pulse: 64  Resp: 16  Temp: 98.6 F (37 C)  SpO2: 95%

## 2022-08-04 NOTE — ED Notes (Signed)
ED provider at bedside.

## 2022-08-04 NOTE — ED Triage Notes (Signed)
Pt sent here from UC with htn and abnormal EKG , pt recently started back on some of her htn meds , pt has also had some some chest pain and left arm pain ,a sob while trying to sleep at night

## 2022-08-04 NOTE — ED Provider Notes (Signed)
[facility]    CSN: [id number] Arrival date & time: 08/04/22  [id number]      History   Chief Complaint Chief Complaint  Patient presents with   Hyperglycemia   Chest Pain   Headache    HPI Heidi Rivera is a 57 y.o. female comes to the urgent care with 1 week history of left-sided headache, blurry vision and chest pain radiating to the left arm.  Symptoms started a week ago and has been persistent.  Chest pain is pressure type with no known aggravating or relieving factors.  Pain does not radiate to her back.  She denies any shortness of breath, cold sweats, dizziness, near syncope or syncopal episodes.  No palpitations.  No known relieving factors.  Patient has not had a cardiac workup in the past.  Patient complains of elevated blood pressure.  Her blood pressure at home this morning was 197/117.  Headache is of moderate severity, throbbing in nature with no known relieving factors.  She has some numbness on the left arm.  She denies any double vision or difficulty with her speech.  She endorses some blurry vision.  No abdominal pain. HPI  Past Medical History:  Diagnosis Date   Acute gastritis    CHRONIC KIDNEY DISEASE STAGE III (MODERATE) 07/11/2008   Qualifier: Diagnosis of  By: Tye Savoy MD, [place]     Chronic left-sided low back pain with left-sided sciatica 07/23/2006   Qualifier: Diagnosis of  By: Herma Ard     Costochondritis, acute 06/17/2019   Dizziness 05/30/2020   Essential hypertension, benign 08/30/2007   Qualifier: Diagnosis of  By: Darylene Price MD, Mark     Gout    Hypertension    Laryngitis 10/13/2019   Migraine without aura 07/11/2008   Qualifier: Diagnosis of  By: Carlena Sax  MD, Stephanie     Migraines    Obesity    OBESITY, NOS 07/23/2006   Qualifier: Diagnosis of  By: Herma Ard     Sciatica [id number]   Stress incontinence 06/17/2019   Tinea pedis of right foot 07/23/2020   Vertigo     Patient Active Problem List   Diagnosis Date  Noted   Numbness of right foot 08/02/2022   Bilateral tricompartmental osteoarthritis of knees 06/27/2022   Pain of left thigh 06/27/2022   Otitis externa 04/12/2021   Statin intolerance 08/22/2020   Lower extremity pain 08/13/2020   Hypertension 07/23/2020   Vitamin D deficiency 07/23/2020   Renal artery stenosis ([place]) 04/13/2020   Mixed incontinence urge and stress 06/17/2019   Migraine without aura 07/11/2008   CHRONIC KIDNEY DISEASE STAGE III (MODERATE) 07/11/2008   Morbid obesity with BMI of 45.0-49.9, adult ([place]) 07/23/2006   Chronic left-sided low back pain with left-sided sciatica 07/23/2006    Past Surgical History:  Procedure Laterality Date   HEMORRHOID SURGERY     PERIPHERAL VASCULAR INTERVENTION  04/26/2020   Procedure: PERIPHERAL VASCULAR INTERVENTION;  Surgeon: [name], MD;  Location: [facility];  Service: Cardiovascular;;  left renal   RENAL ANGIOGRAPHY Right 04/26/2020   Procedure: RENAL ANGIOGRAPHY;  Surgeon: [name], MD;  Location: [facility];  Service: Cardiovascular;  Laterality: Right;    OB History   No obstetric history on file.      Home Medications    Prior to Admission medications   Medication Sig Start Date End Date Taking? Authorizing Provider  acetaminophen (TYLENOL) 500 MG tablet Take 2 tablets (1,000 mg total) by mouth every  8 (eight) hours as needed. 09/11/20  Yes Brimage, Vondra, DO  amLODipine (NORVASC) 5 MG tablet Take 1 tablet (5 mg total) by mouth at bedtime. 07/31/22  Yes Lowry Ram, MD  aspirin 81 MG chewable tablet Chew by mouth daily.   Yes [provider]  Blood Pressure Monitoring (BLOOD PRESSURE CUFF) MISC 1 Device by Does not apply route daily. 06/17/19  Yes Mullis, Kiersten P, DO  clopidogrel (PLAVIX) 75 MG tablet Take 1 tablet by mouth once daily 12/21/20  Yes [name], MD  diphenhydrAMINE (BENADRYL ALLERGY) 25 mg capsule Take 1 capsule (25 mg total) by mouth every 6 (six) hours  as needed. 09/11/20  Yes Brimage, Vondra, DO  empagliflozin (JARDIANCE) 10 MG TABS tablet Take 1 tablet (10 mg total) by mouth daily. 12/09/21  Yes Erskine Emery, MD  furosemide (LASIX) 40 MG tablet Take 1.5 tablets (60 mg total) by mouth daily. Patient taking differently: Take 60 mg by mouth 2 (two) times daily. 09/11/20 08/04/22 Yes Brimage, Vondra, DO  hydrALAZINE (APRESOLINE) 10 MG tablet Take 7.5 tablets (75 mg total) by mouth 3 (three) times daily. Patient taking differently: Take 100 mg by mouth 3 (three) times daily. 10/08/21  Yes Carollee Leitz, MD  levalbuterol [facility] HFA) 45 MCG/ACT inhaler Inhale 1-2 puffs into the lungs every 6 (six) hours as needed for wheezing. 06/19/21  Yes Lurline Del, DO  pregabalin (LYRICA) 25 MG capsule Take 1 capsule (25 mg total) by mouth daily. 07/31/22  Yes Lowry Ram, MD  Vitamin D, Ergocalciferol, (DRISDOL) 1.25 MG (50000 UNIT) CAPS capsule Take 50,000 Units by mouth every Monday.  12/22/19  Yes [provider]  ciprofloxacin-dexamethasone (CIPRODEX) OTIC suspension Place 4 drops into the right ear 2 (two) times daily for 7 days. 07/31/22 08/07/22  Lowry Ram, MD  ketoconazole (NIZORAL) 2 % cream Apply 1 application topically daily. For 14 days Patient not taking: Reported on 05/01/2022 07/23/20   [name], DO  pravastatin (PRAVACHOL) 40 MG tablet Take 1 tablet (40 mg total) by mouth at bedtime. Patient not taking: Reported on 06/26/2022 08/29/20   Lurline Del, DO  prochlorperazine (COMPAZINE) 10 MG tablet Take 1 tablet (10 mg total) by mouth every 6 (six) hours as needed for nausea or vomiting. 09/11/20   Brimage, Ronnette Juniper, DO  tiZANidine (ZANAFLEX) 4 MG tablet Take 1 tablet (4 mg total) by mouth every 6 (six) hours as needed for muscle spasms. 06/26/22   McDiarmid, Blane Ohara, MD  fluticasone (FLONASE) 50 MCG/ACT nasal spray Place 2 sprays into both nostrils daily. 09/19/18 06/15/19  Tasia Catchings, Amy V, PA-C  Galcanezumab-gnlm (EMGALITY) 120 MG/ML SOAJ Inject 120  mg into the skin every 28 (twenty-eight) days. Patient not taking: Reported on 09/25/2021 03/11/21   Pieter Partridge, DO  rizatriptan (MAXALT-MLT) 10 MG disintegrating tablet Take 1 tablet at earliest onset of migraine. May repeat in 2 hours if needed. Maximum 2 tablets in 24 hours. Patient not taking: Reported on 09/25/2021 03/11/21   Pieter Partridge, DO    Family History Family History  Problem Relation Age of Onset   Diabetes Mother    Breast cancer Mother    Other Mother        Larynx cancer   Diabetes Maternal Aunt    Hypertension Father    Hypercholesterolemia Father    Other Father        DDD   Cancer - Other Paternal Grandmother        bone   Colon cancer Maternal  Uncle     Social History Social History   Tobacco Use   Smoking status: Never   Smokeless tobacco: Never  Vaping Use   Vaping Use: Never used  Substance Use Topics   Alcohol use: Yes    Comment: rare   Drug use: No     Allergies   Tramadol   Review of Systems Review of Systems As per HPI  Physical Exam Triage Vital Signs ED Triage Vitals  Enc Vitals Group     BP 08/04/22 1001 (!) 205/101     Pulse Rate 08/04/22 1001 64     Resp 08/04/22 1001 16     Temp 08/04/22 1001 98.6 F (37 C)     Temp Source 08/04/22 1001 Oral     SpO2 08/04/22 1001 95 %     Weight --      Height --      Head Circumference --      Peak Flow --      Pain Score 08/04/22 0954 10     Pain Loc --      Pain Edu? --      Excl. in [place]? --    No data found.  Updated Vital Signs BP (!) 205/101 (BP Location: Right Arm)   Pulse 64   Temp 98.6 F (37 C) (Oral)   Resp 16   LMP 07/02/2022   SpO2 95%   Visual Acuity Right Eye Distance:   Left Eye Distance:   Bilateral Distance:    Right Eye Near:   Left Eye Near:    Bilateral Near:     Physical Exam Vitals and nursing note reviewed.  Constitutional:      General: She is not in acute distress.    Appearance: She is well-developed. She is not ill-appearing.   Cardiovascular:     Rate and Rhythm: Normal rate and regular rhythm.     Chest Wall: PMI is not displaced.     Heart sounds: Normal heart sounds.  Pulmonary:     Effort: Pulmonary effort is normal.  Abdominal:     Palpations: Abdomen is soft.  Musculoskeletal:     Cervical back: Normal range of motion and neck supple.  Neurological:     Mental Status: She is alert.      UC Treatments / Results  Labs (all labs ordered are listed, but only abnormal results are displayed) Labs Reviewed - No data to display  EKG   Radiology DG Chest 2 View  Result Date: 08/04/2022 CLINICAL DATA:  Chest pain and shortness of breath. EXAM: CHEST - 2 VIEW COMPARISON:  Chest radiographs 04/16/2021 and 06/23/2019 FINDINGS: Cardiac silhouette and mediastinal contours are within normal limits. The lungs are clear. No pleural effusion or pneumothorax. Moderate multilevel anterior degenerative bridging osteophytes of the mid to lower thoracic spine. IMPRESSION: No active cardiopulmonary disease. Electronically Signed   By: Yvonne Kendall M.D.   On: 08/04/2022 10:55    Procedures Procedures (including critical care time)  Medications Ordered in UC Medications - No data to display  Initial Impression / Assessment and Plan / UC Course  I have reviewed the triage vital signs and the nursing notes.  Pertinent labs & imaging results that were available during my care of the patient were reviewed by me and considered in my medical decision making (see chart for details).     1.  Hypertensive emergency: Patient is advised to go to the emergency room for further evaluation. EKG shows sinus  bradycardia with T wave inversions in the inferior and lateral leads. Patient will benefit from further evaluation in the ED Patient agrees to go to the emergency department and she will self transport to the ED. Final Clinical Impressions(s) / UC Diagnoses   Final diagnoses:  Hypertensive emergency     Discharge  Instructions      Please go to the emergency department for further evaluation.   ED Prescriptions   None    PDMP not reviewed this encounter.   Chase Picket, MD 08/04/22 [phone]

## 2022-08-04 NOTE — ED Provider Notes (Signed)
[facility] Provider Note   CSN: [id number] Arrival date & time: 08/04/22  1012     History {Add pertinent medical, surgical, social history, OB history to HPI:1} Chief Complaint  Patient presents with   Fever    [id number]    Heidi Rivera is a 57 y.o. female.  HPI Patient presents for hypertension, headache, chest pain.  Medical to include obesity, CKD, HTN, renal artery stenosis s/p stenting, migraines, gout.  She reports that she was recently started on blood pressure medications but she has been having a difficult time controlling her elevated blood pressures.  Onset of current symptoms was 1 week ago.  Patient presented to urgent care prior to arrival.  Blood pressures at urgent care were 205/101.  On arrival, patient endorses ongoing frontal headache.  She states that her headache has not evolved over the past week.  She has taken her blood pressure medications today.  She states that SBP has been greater than 200 for the past week.    Home Medications Prior to Admission medications   Medication Sig Start Date End Date Taking? Authorizing Provider  acetaminophen (TYLENOL) 500 MG tablet Take 2 tablets (1,000 mg total) by mouth every 8 (eight) hours as needed. 09/11/20   Brimage, Ronnette Juniper, DO  amLODipine (NORVASC) 5 MG tablet Take 1 tablet (5 mg total) by mouth at bedtime. 07/31/22   Lowry Ram, MD  aspirin 81 MG chewable tablet Chew by mouth daily.    [provider]  Blood Pressure Monitoring (BLOOD PRESSURE CUFF) MISC 1 Device by Does not apply route daily. 06/17/19   Mullis, Kiersten P, DO  ciprofloxacin-dexamethasone (CIPRODEX) OTIC suspension Place 4 drops into the right ear 2 (two) times daily for 7 days. 07/31/22 08/07/22  Lowry Ram, MD  clopidogrel (PLAVIX) 75 MG tablet Take 1 tablet by mouth once daily 12/21/20   [name], MD  diphenhydrAMINE (BENADRYL ALLERGY) 25 mg capsule Take 1 capsule (25 mg total) by  mouth every 6 (six) hours as needed. 09/11/20   Brimage, Ronnette Juniper, DO  empagliflozin (JARDIANCE) 10 MG TABS tablet Take 1 tablet (10 mg total) by mouth daily. 12/09/21   Erskine Emery, MD  furosemide (LASIX) 40 MG tablet Take 1.5 tablets (60 mg total) by mouth daily. Patient taking differently: Take 60 mg by mouth 2 (two) times daily. 09/11/20 08/04/22  [name], DO  hydrALAZINE (APRESOLINE) 10 MG tablet Take 7.5 tablets (75 mg total) by mouth 3 (three) times daily. Patient taking differently: Take 100 mg by mouth 3 (three) times daily. 10/08/21   Carollee Leitz, MD  ketoconazole (NIZORAL) 2 % cream Apply 1 application topically daily. For 14 days Patient not taking: Reported on 05/01/2022 07/23/20   [name], DO  levalbuterol [facility] HFA) 45 MCG/ACT inhaler Inhale 1-2 puffs into the lungs every 6 (six) hours as needed for wheezing. 06/19/21   Lurline Del, DO  pravastatin (PRAVACHOL) 40 MG tablet Take 1 tablet (40 mg total) by mouth at bedtime. Patient not taking: Reported on 06/26/2022 08/29/20   Lurline Del, DO  pregabalin (LYRICA) 25 MG capsule Take 1 capsule (25 mg total) by mouth daily. 07/31/22   Lowry Ram, MD  prochlorperazine (COMPAZINE) 10 MG tablet Take 1 tablet (10 mg total) by mouth every 6 (six) hours as needed for nausea or vomiting. 09/11/20   Brimage, Ronnette Juniper, DO  tiZANidine (ZANAFLEX) 4 MG tablet Take 1 tablet (4 mg total) by mouth every 6 (six) hours as needed  for muscle spasms. 06/26/22   McDiarmid, Blane Ohara, MD  Vitamin D, Ergocalciferol, (DRISDOL) 1.25 MG (50000 UNIT) CAPS capsule Take 50,000 Units by mouth every Monday.  12/22/19   [provider]  fluticasone (FLONASE) 50 MCG/ACT nasal spray Place 2 sprays into both nostrils daily. 09/19/18 06/15/19  Tasia Catchings, Amy V, PA-C  Galcanezumab-gnlm (EMGALITY) 120 MG/ML SOAJ Inject 120 mg into the skin every 28 (twenty-eight) days. Patient not taking: Reported on 09/25/2021 03/11/21   Pieter Partridge, DO  rizatriptan (MAXALT-MLT) 10 MG  disintegrating tablet Take 1 tablet at earliest onset of migraine. May repeat in 2 hours if needed. Maximum 2 tablets in 24 hours. Patient not taking: Reported on 09/25/2021 03/11/21   Pieter Partridge, DO      Allergies    Tramadol    Review of Systems   Review of Systems  Physical Exam Updated Vital Signs BP (!) 197/107   Pulse 61   Temp 98.1 F (36.7 C) (Oral)   Resp 17   LMP 07/02/2022   SpO2 100%  Physical Exam  ED Results / Procedures / Treatments   Labs (all labs ordered are listed, but only abnormal results are displayed) Labs Reviewed  BASIC METABOLIC PANEL - Abnormal; Notable for the following components:      Result Value   Glucose, Bld 121 (*)    Creatinine, Ser 2.54 (*)    GFR, Estimated 21 (*)    All other components within normal limits  CBC - Abnormal; Notable for the following components:   RBC 5.21 (*)    All other components within normal limits  TROPONIN I (HIGH SENSITIVITY)    EKG None  Radiology DG Chest 2 View  Result Date: 08/04/2022 CLINICAL DATA:  Chest pain and shortness of breath. EXAM: CHEST - 2 VIEW COMPARISON:  Chest radiographs 04/16/2021 and 06/23/2019 FINDINGS: Cardiac silhouette and mediastinal contours are within normal limits. The lungs are clear. No pleural effusion or pneumothorax. Moderate multilevel anterior degenerative bridging osteophytes of the mid to lower thoracic spine. IMPRESSION: No active cardiopulmonary disease. Electronically Signed   By: Yvonne Kendall M.D.   On: 08/04/2022 10:55    Procedures Procedures  {Document cardiac monitor, telemetry assessment procedure when appropriate:1}  Medications Ordered in ED Medications - No data to display  ED Course/ Medical Decision Making/ A&P   {   Click here for ABCD2, HEART and other calculatorsREFRESH Note before signing :1}                          Medical Decision Making Amount and/or Complexity of Data Reviewed Labs: ordered. Radiology: ordered.  Risk OTC  drugs. Prescription drug management.   ***  {Document critical care time when appropriate:1} {Document review of labs and clinical decision tools ie heart score, Chads2Vasc2 etc:1}  {Document your independent review of radiology images, and any outside records:1} {Document your discussion with family members, caretakers, and with consultants:1} {Document social determinants of health affecting pt's care:1} {Document your decision making why or why not admission, treatments were needed:1} Final Clinical Impression(s) / ED Diagnoses Final diagnoses:  None    Rx / DC Orders ED Discharge Orders     None

## 2022-08-04 NOTE — ED Provider Notes (Signed)
Patient is to me by Dr. Doren Custard pending results of treatment for headache.  Patient has history of migraines is similar.  Will treat with medications here and reassess and likely discharge home   Lacretia Leigh, MD 08/04/22 1811

## 2022-08-04 NOTE — ED Notes (Signed)
Ed provider at bedside

## 2022-08-04 NOTE — ED Notes (Signed)
Pt medicated per mar will continue to monitor

## 2022-08-05 DIAGNOSIS — I701 Atherosclerosis of renal artery: Secondary | ICD-10-CM | POA: Diagnosis not present

## 2022-08-05 DIAGNOSIS — N184 Chronic kidney disease, stage 4 (severe): Secondary | ICD-10-CM | POA: Diagnosis not present

## 2022-08-05 DIAGNOSIS — I129 Hypertensive chronic kidney disease with stage 1 through stage 4 chronic kidney disease, or unspecified chronic kidney disease: Secondary | ICD-10-CM | POA: Diagnosis not present

## 2022-08-05 NOTE — Telephone Encounter (Signed)
Medication was denied by insurance.

## 2022-08-06 ENCOUNTER — Other Ambulatory Visit (HOSPITAL_COMMUNITY): Payer: Self-pay

## 2022-08-06 NOTE — Telephone Encounter (Signed)
Denial reason:  Step therapy required, but insurance doesn't like preferred medications (although pregabalin is preferred).

## 2022-08-11 ENCOUNTER — Ambulatory Visit: Payer: BLUE CROSS/BLUE SHIELD | Admitting: Student

## 2022-08-11 NOTE — Progress Notes (Deleted)
  SUBJECTIVE:   CHIEF COMPLAINT / HPI:   Hypertension:   today. Home medications include: ***. She {Blank single:19197::"endorses","does not endorse"} taking these medications as prescribed. {Blank single:19197::"Does","Does not"} check blood pressure at home.*** Diet ***. Exercise ***. Most recent creatinine trend:  Lab Results  Component Value Date   CREATININE 2.54 (H) 08/04/2022   CREATININE 2.93 (H) 11/10/2021   CREATININE 2.79 (H) 09/25/2021   Patient {HAS HAS [id number] had a BMP in the past 1 year.   PERTINENT  PMH / PSH:  HTN Migraine  RAS OA  CKD III Vit D def  Morbid obesity   Patient Care Team: Erskine Emery, MD as PCP - General (Family Medicine) Freada Bergeron, MD as PCP - Cardiology (Cardiology) OBJECTIVE:  LMP 07/02/2022  Physical Exam   ASSESSMENT/PLAN:  There are no diagnoses linked to this encounter.   today. {Blank single:19197::"Well controlled","Poorly controlled"}. Goal of ***. Continue to work on healthy dietary habits and exercise. Follow up in ***.   Medication regimen: ***  No follow-ups on file. Erskine Emery, MD 08/11/2022, 11:33 AM PGY-2, [place] {    This will disappear when note is signed, click to select method of visit    :1}

## 2022-08-12 NOTE — Telephone Encounter (Signed)
Correction:   Insurance doesn't list preferred medications*

## 2022-08-23 IMAGING — MR MR LUMBAR SPINE W/O CM
4 of 5 series · 18 of 48 positions shown · non-contrast
Comparison: None.

CLINICAL DATA: Low back pain

EXAM:
MRI LUMBAR SPINE WITHOUT CONTRAST
TECHNIQUE: Multiplanar, multisequence MR imaging of the lumbar spine was
performed. No intravenous contrast was administered.

[Series 6: T2 · sagittal · 4.0mm · 0.73mm/px · 6 of 15 slices shown (1 of 2)]
[im 1/15]
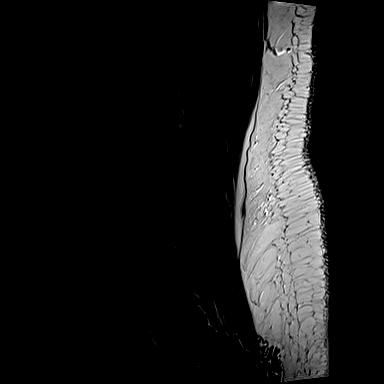
[im 3/15]
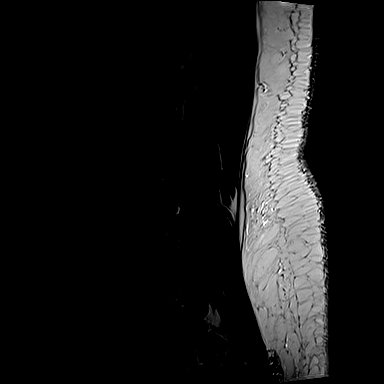
[im 6/15]
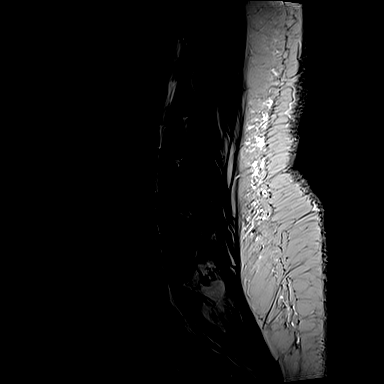
[im 9/15]
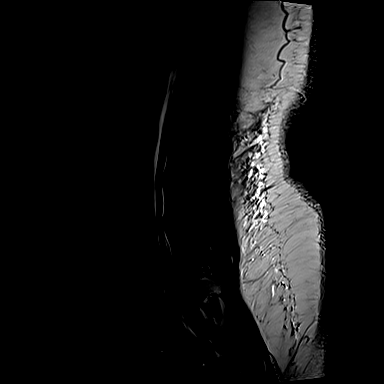
[im 12/15]
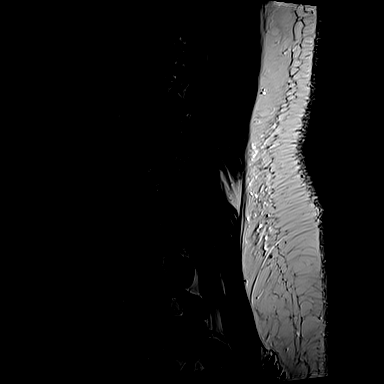
[im 15/15]
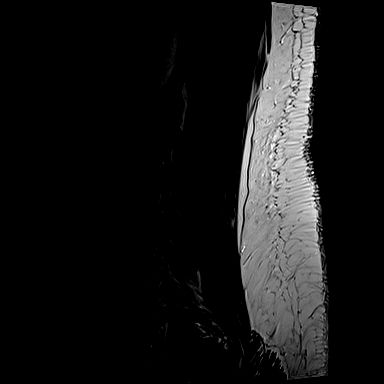

[Series 7: T1 · sagittal · 4.0mm · 0.73mm/px · 3 of 15 slices shown (1 of 2)]
[im 3/15]
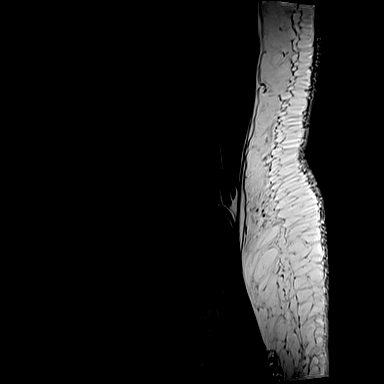
[im 9/15]
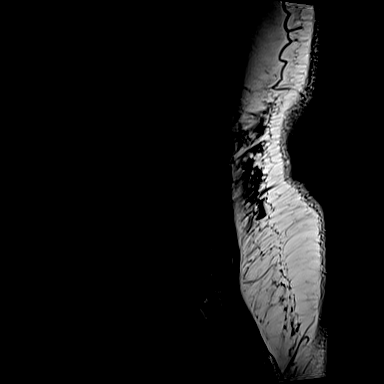
[im 15/15]
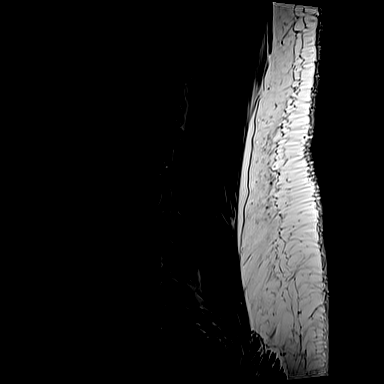

[Series 11: T1 · axial · 4.0mm · 0.28mm/px · z∈[-130,+34]mm · 3 of 41 slices shown (2 of 2)]
[im 6/41]
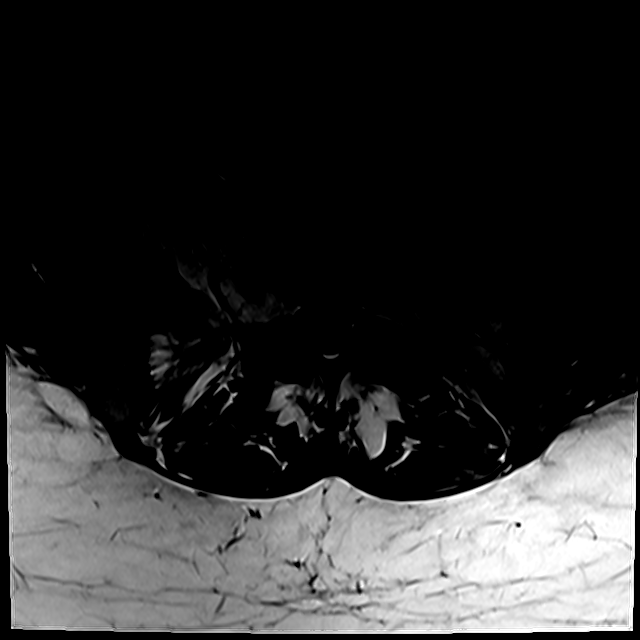
[im 21/41]
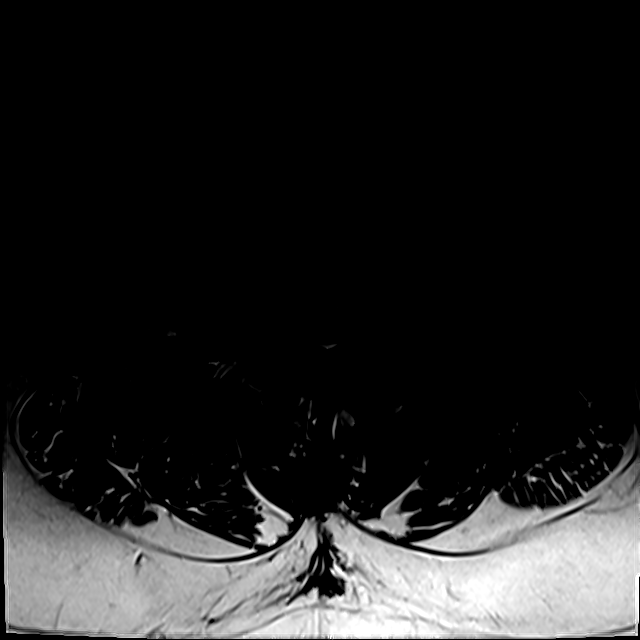
[im 35/41]
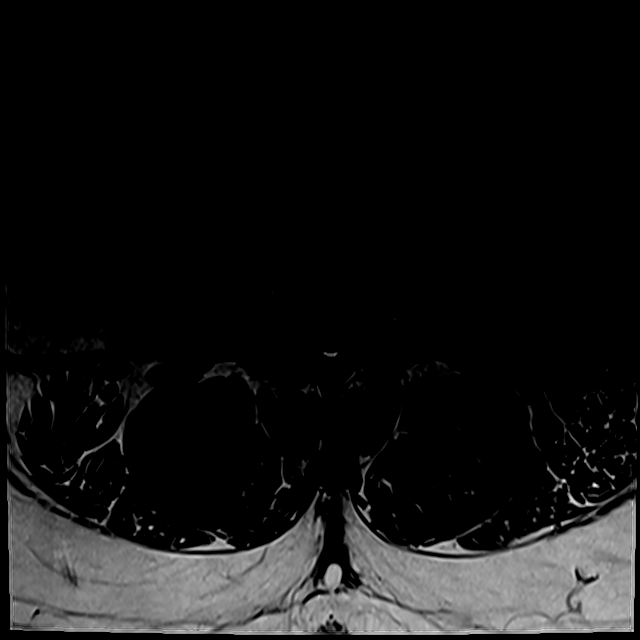

[Series 14: T2 · axial · 4.0mm · 0.28mm/px · z∈[-154,+34]mm · 6 of 41 slices shown (2 of 2)]
[im 1/41]
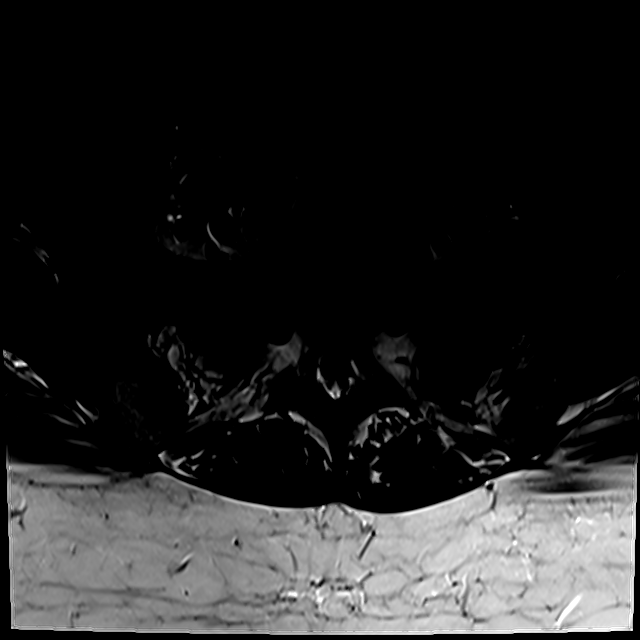
[im 6/41]
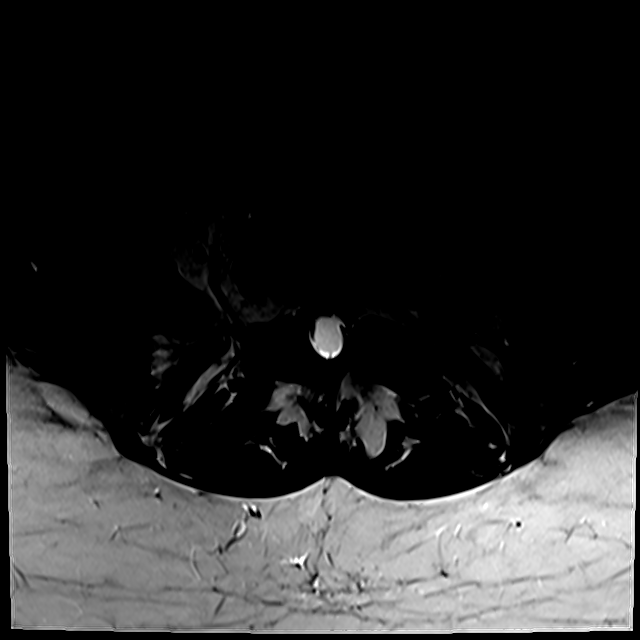
[im 12/41]
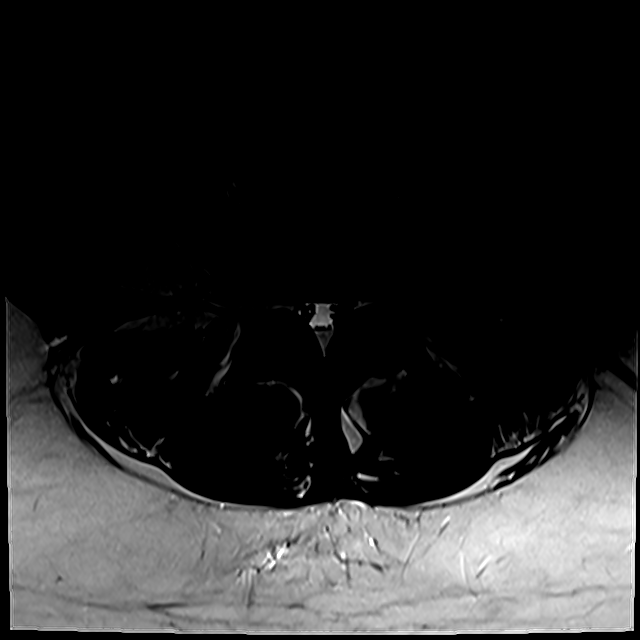
[im 18/41]
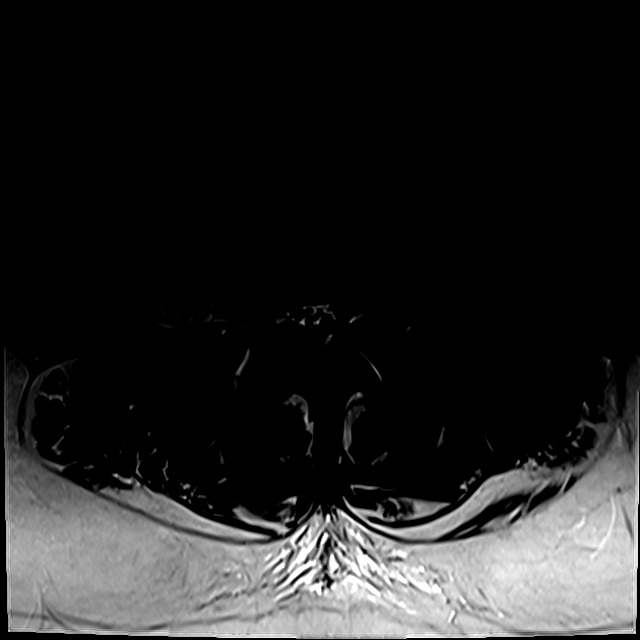
[im 21/41]
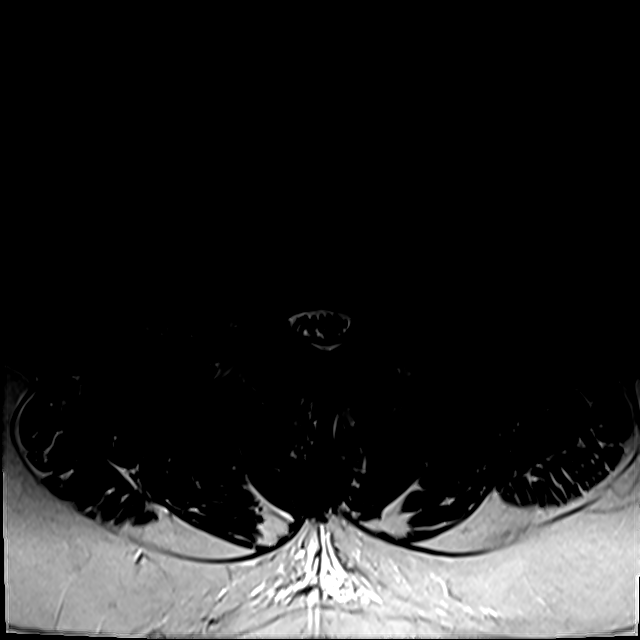
[im 35/41]
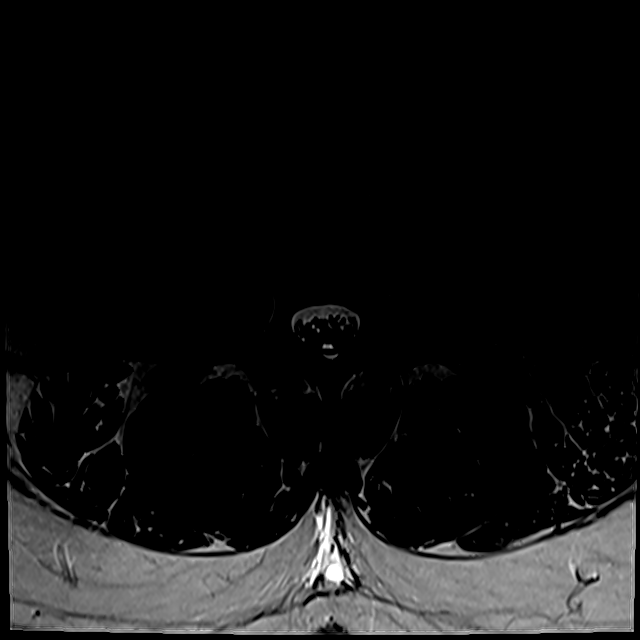

[18 of 48 positions shown; findings below may reference images not displayed]

FINDINGS: Segmentation:  Standard.

Alignment:  Trace retrolisthesis at L4-L5.

Vertebrae: Vertebral body heights are maintained. No marrow edema.
No suspicious osseous lesion.

Conus medullaris and cauda equina: Conus extends to the L1-L2 level.
Conus and cauda equina appear normal.

Paraspinal and other soft tissues: Unremarkable.

Disc levels: Disc desiccation without substantial disc height loss
at L2-L3, L3-L4, and L4-L5. Mild congenital narrowing of the spinal
canal.

L1-L2:  No canal or foraminal stenosis.

L2-L3: Disc bulge and facet arthropathy with ligamentum flavum
infolding. Minor canal stenosis. No significant foraminal stenosis.

L3-L4: Disc bulge with small left foraminal annular fissure. Facet
arthropathy with ligamentum flavum infolding. Mild canal stenosis.
No significant foraminal stenosis.

L4-L5: Disc bulge and facet arthropathy. No significant canal or
foraminal stenosis.

L5-S1: Facet arthropathy. No significant canal or foraminal
stenosis.
IMPRESSION: Mild multilevel degenerative changes as detailed above. No
high-grade stenosis. There is multilevel facet arthropathy.

## 2022-08-28 ENCOUNTER — Telehealth: Payer: Self-pay

## 2022-08-28 ENCOUNTER — Other Ambulatory Visit: Payer: Self-pay | Admitting: Family Medicine

## 2022-08-28 DIAGNOSIS — G2581 Restless legs syndrome: Secondary | ICD-10-CM

## 2022-08-28 DIAGNOSIS — R200 Anesthesia of skin: Secondary | ICD-10-CM

## 2022-08-28 MED ORDER — PREGABALIN 25 MG PO CAPS: 25.0000 mg | ORAL_CAPSULE | Freq: Every day | ORAL | 0 refills | Status: DC

## 2022-08-28 NOTE — Telephone Encounter (Signed)
Patient calls nurse line regarding continued issues with restless legs. She states that she has not been able to get pregabalin due to issues with insurance coverage.   She states that she had to miss work today due to being up all night with pain.   She is requesting work excuse for today.   She also reports that she now has Medicaid, no longer with [employer]. She is going to call back with information from new insurance card. Will then attempt prior authorization with new insurance.   Called pharmacy to gather more information. They state that the resident DEA is not being processed. Requesting that attending send prescription.   Will forward to precepting provider to resent pregabalin prescription.   Forwarding request to Dr. Gwendolyn Lima as well for work note for today.   Talbot Grumbling, RN

## 2022-08-28 NOTE — Telephone Encounter (Signed)
Called pharmacy to check on pregabalin prescription. With attending DEA, medication was able to be processed for $4.   Called and informed patient of update.   Patient appreciative. She states that she will still need work letter for today. Advised that we would contact her with update regarding this request.   Talbot Grumbling, RN

## 2022-08-28 NOTE — Progress Notes (Signed)
Pregabalin sent.

## 2022-09-09 ENCOUNTER — Telehealth: Payer: Self-pay

## 2022-09-09 ENCOUNTER — Ambulatory Visit (INDEPENDENT_AMBULATORY_CARE_PROVIDER_SITE_OTHER): Payer: Medicaid Other | Admitting: Family Medicine

## 2022-09-09 VITALS — BP 180/90 | HR 95 | Ht 69.5 in | Wt 313.8 lb

## 2022-09-09 DIAGNOSIS — E119 Type 2 diabetes mellitus without complications: Secondary | ICD-10-CM | POA: Diagnosis not present

## 2022-09-09 DIAGNOSIS — G629 Polyneuropathy, unspecified: Secondary | ICD-10-CM | POA: Diagnosis not present

## 2022-09-09 DIAGNOSIS — I10 Essential (primary) hypertension: Secondary | ICD-10-CM

## 2022-09-09 MED ORDER — EMPAGLIFLOZIN 10 MG PO TABS: 10.0000 mg | ORAL_TABLET | Freq: Every day | ORAL | 0 refills | Status: DC

## 2022-09-09 MED ORDER — DULOXETINE HCL 30 MG PO CPEP: 30.0000 mg | ORAL_CAPSULE | Freq: Every day | ORAL | 0 refills | Status: DC

## 2022-09-09 MED ORDER — VITAMIN B-12 1000 MCG PO TABS: 1000.0000 ug | ORAL_TABLET | Freq: Every day | ORAL | 0 refills | Status: AC

## 2022-09-09 NOTE — Progress Notes (Deleted)
Since start of year, had pein in right leg near lateral knee, improved w massage Denies ulcers L heel pain Started in legs and moved down bilaterally Started in l leg "Pins and needles" in legs Lyrica helping her sleep; started a week ago; reports she was previously woken up by her symptoms; medication makes her sleepy Burning pain in L big toe, only in feet (not in legs) Pain in legs when she lays down or sits still for a while "cold water running through veins" started around 06/2022 Also has pain with up and about  No hip, lower back pain; no strenuous energy preceding injury  Jardiance refill needed Still on Lasix  Started on spironolactone by her kidney doc few months ago Slight headache, no blurry vision, no CP, SOB

## 2022-09-09 NOTE — Patient Instructions (Signed)
It was great to see you!  -We will start a daily vitamin B12 supplement in case this is contributing to your neuropathy.  I sent a prescription to your pharmacy.  -We will also start a medication called Cymbalta.  Take this once daily.  This can take several weeks to reach its full effect.  -I have placed a referral to PM&R, who are experts in pain management and have additional tricks up their sleeves including various procedures.  -Please see your kidney doctor as soon as possible to follow-up on your blood pressure.   Follow-up in 1 month.  -Dr Anner Crete

## 2022-09-09 NOTE — Progress Notes (Signed)
    SUBJECTIVE:   CHIEF COMPLAINT / HPI:   Heidi Rivera is a 57 y.o. female who presents today for bilateral leg pain.  Bilateral leg pain She started to experience a "pins and needles" sensation in her left leg, and eventually her bilateral legs, in 06/2022. This then progressed down to her feet, especially her L heel. She reports burning pain in her toes, especially her L big toe. Pain occurs both at rest and with exertion, not worsened or bettered by either. She denies ulcers. No hip, lower back pain; no strenuous activity preceding injury. Previously seen 07/31/22 for similar symptoms by Dr. Marsh Dolly, who started her on Lyrica 25 mg nightly. She reports she was just able to start this a week ago. This is helping her sleep through the night and improving nighttime symptoms, but it does make her sleepy so she cannot take this during the day.  Hypertension BP remains elevated in clinic today at 190/100, then 180/90 at repeat. She reports recent slight headache, no blurry vision, no CP, no SOB. Took BP meds this morning. She is followed by nephrology, who is adjusting her medications. Started on spironolactone by her kidney doc few months ago. Current BP meds include furosemide 60 mg daily, hydralazine 100 mg TID, spironolactone 50 mg daily. Nephrologist rec irbesartan per most recent note, pt does not report taking this.  PERTINENT  PMH / PSH: HTN, Renal Artery Stenosis, CKD III  OBJECTIVE:   BP (!) 180/90   Pulse 95   Ht 5' 9.5" (1.765 m)   Wt (!) 313 lb 12.8 oz (142.3 kg)   SpO2 100%   BMI 45.68 kg/m   General: Pt is seated comfortably in chair, NAD Cardiovascular: RRR, no murmurs, rubs, gallops. Pulmonary: Normal work of breathing. Lungs clear to auscultation bilaterally. Back: Nontender, FROM BLE: Minimal ankle edema bilaterally. Minimal tenderness to calf palpation bilaterally, minimal lower ankle tenderness bilaterally. No ulceration, discoloration present in bilateral bottom of  feet. Normal sensation bilaterally. Normal range of motion bilaterally. Neuro/Psych: A&Ox3. Normal affect.  ASSESSMENT/PLAN:   Hypertension Pt's BP remains elevated at 180/90 on repeat check. Longstanding history of poorly controlled HTN. Followed by Nephrology. - Continue to follow with Nephrology for med management.  Neuropathy Reports nighttime symptoms/sleep improved on Lyrica, daytime symptoms unchanged. Pain seems most likely neuropathic based on description and lack of provocation with activity. Unlikely from back given lack of back symptoms and lack of prior trauma. Differential includes diabetic neuropathy, though unlikely given normal range Ha1c; also considering B12 deficiency given pt was at lower end of normal.  - Will start B12 1000 mcg supplement to see if this improves symptoms. - Trial Cymbalta 30 mg daily to improve neuropathic pain. - Will refer pt to PM&R for further management and consideration of capsaicin patch should symptoms not improve with above measures   DM2- Jardiance refills sent per patient request.  Governor Rooks, Medical Student [facility]    I was personally present and re-performed the exam and medical decision making and verified the service and findings are accurately documented in the student's note. Maury Dus, MD 09/10/2022 7:49 AM

## 2022-09-09 NOTE — Telephone Encounter (Signed)
Received fax from pharmacy, PA needed on Jardiance.  Clinical questions submitted via Cover My Meds.  Waiting on response, could take up to 72 hours.  Cover My Meds info: Key: [id number]  Veronda Prude, RN

## 2022-09-09 NOTE — Assessment & Plan Note (Signed)
Reports nighttime symptoms/sleep improved on Lyrica, daytime symptoms unchanged. Pain seems most likely neuropathic based on description and lack of provocation with activity. Unlikely from back given lack of back symptoms and lack of prior trauma. Differential includes diabetic neuropathy, though unlikely given normal range Ha1c; also considering B12 deficiency given pt was at lower end of normal.  - Will start B12 1000 mcg supplement to see if this improves symptoms. - Trial Cymbalta 30 mg daily to improve neuropathic pain. - Will refer pt to PM&R for further management and consideration of capsaicin patch should symptoms not improve with above measures

## 2022-09-09 NOTE — Assessment & Plan Note (Signed)
Pt's BP remains elevated at 180/90 on repeat check. Longstanding history of poorly controlled HTN. Followed by Nephrology. - Continue to follow with Nephrology for med management.

## 2022-10-06 ENCOUNTER — Ambulatory Visit (INDEPENDENT_AMBULATORY_CARE_PROVIDER_SITE_OTHER): Payer: Medicaid Other

## 2022-10-06 DIAGNOSIS — Z111 Encounter for screening for respiratory tuberculosis: Secondary | ICD-10-CM | POA: Diagnosis present

## 2022-10-07 NOTE — Progress Notes (Signed)
Patient is here for a PPD placement.  PPD placed in left forearm @ 1015 am.  Patient will return 10/08/2022 to have PPD read. Veronda Prude, RN

## 2022-10-08 ENCOUNTER — Ambulatory Visit: Payer: Medicaid Other

## 2022-10-08 ENCOUNTER — Encounter: Payer: Self-pay | Admitting: Physical Medicine & Rehabilitation

## 2022-10-08 DIAGNOSIS — Z111 Encounter for screening for respiratory tuberculosis: Secondary | ICD-10-CM

## 2022-10-08 LAB — TB SKIN TEST
Induration: 0 mm
TB Skin Test: NEGATIVE

## 2022-10-08 NOTE — Progress Notes (Signed)
PPD Reading Note  PPD read and results entered in EpicCare.  Result: 0  mm induration.  Interpretation:Negative   If test not read within 48-72 hours of initial placement, patient advised to repeat in other arm 1-3 weeks after this test.  Allergic reaction:No

## 2022-10-24 ENCOUNTER — Other Ambulatory Visit (HOSPITAL_COMMUNITY): Payer: Self-pay

## 2022-10-24 ENCOUNTER — Telehealth: Payer: Self-pay | Admitting: Pharmacy Technician

## 2022-10-24 NOTE — Telephone Encounter (Signed)
Patient Advocate Encounter  Received notification from [facility] that prior authorization for UBRELVY 100MG  is required.   PA submitted on 5.31.24 Key [id number] Status is pending

## 2022-10-29 NOTE — Telephone Encounter (Signed)
Patient Advocate Encounter  Prior Authorization for Bernita Raisin 100MG  tablets has been approved through [employer].  KeyLedon Snare    Effective: [id number] to 10-24-2023

## 2022-11-13 ENCOUNTER — Encounter: Payer: Medicaid Other | Attending: Physical Medicine & Rehabilitation | Admitting: Physical Medicine & Rehabilitation

## 2023-05-04 ENCOUNTER — Other Ambulatory Visit: Payer: Self-pay | Admitting: Family Medicine

## 2023-05-04 DIAGNOSIS — R200 Anesthesia of skin: Secondary | ICD-10-CM

## 2023-05-04 DIAGNOSIS — G2581 Restless legs syndrome: Secondary | ICD-10-CM

## 2023-05-13 ENCOUNTER — Ambulatory Visit (INDEPENDENT_AMBULATORY_CARE_PROVIDER_SITE_OTHER): Payer: Medicaid Other

## 2023-05-13 ENCOUNTER — Ambulatory Visit (HOSPITAL_COMMUNITY)
Admission: EM | Admit: 2023-05-13 | Discharge: 2023-05-13 | Disposition: A | Discharge status: Home / Self Care | Payer: Medicaid Other | Attending: Family Medicine | Admitting: Family Medicine

## 2023-05-13 ENCOUNTER — Encounter (HOSPITAL_COMMUNITY): Payer: Self-pay | Admitting: *Deleted

## 2023-05-13 ENCOUNTER — Other Ambulatory Visit: Payer: Self-pay

## 2023-05-13 DIAGNOSIS — R079 Chest pain, unspecified: Secondary | ICD-10-CM

## 2023-05-13 HISTORY — DX: Type 2 diabetes mellitus without complications: E11.9

## 2023-05-13 LAB — POCT FASTING CBG KUC MANUAL ENTRY: POCT Glucose (KUC): 153 mg/dL — AB (ref 70–99)

## 2023-05-13 MED ORDER — PREDNISONE 20 MG PO TABS: 40.0000 mg | ORAL_TABLET | Freq: Every day | ORAL | 0 refills | Status: DC

## 2023-05-13 NOTE — Discharge Instructions (Addendum)
You have been seen at the [facility] today for chest pain. Your evaluation today was not suggestive of any emergent condition requiring medical intervention at this time. Your chest x-ray and ECG (heart tracing) did not show any worrisome changes. However, some medical problems make take more time to appear. Therefore, it's very important that you pay attention to any new symptoms or worsening of your current condition.  Please proceed directly to the Emergency Department immediately should you feel worse in any way or have any of the following symptoms: increasing or different chest pain, pain that spreads to your arm, neck, jaw, back or abdomen, shortness of breath, or nausea and vomiting.  As we discussed, watch your blood sugars closely while taking prednisone.

## 2023-05-13 NOTE — ED Provider Notes (Signed)
[facility]   [id number] 05/13/23 Arrival Time: 3875  ASSESSMENT & PLAN:  1. Chest pain, unspecified type     Patient history and exam consistent with non-cardiac cause of chest pain.  ECG: Performed today and interpreted by me: normal EKG, normal sinus rhythm. Non-spec T wave changes in inferior leads. No acute changes from last EKG in pt's chart.  I have personally viewed and independently interpreted the imaging studies ordered this visit. CXR: no acute changes; no pneumothorax.  Suspect MSK etiology. Trial of: Meds ordered this encounter  Medications   predniSONE (DELTASONE) 20 MG tablet    Sig: Take 2 tablets (40 mg total) by mouth daily.    Dispense:  10 tablet    Refill:  0   Activities as tolerated.  Labs Reviewed  POCT FASTING CBG KUC MANUAL ENTRY - Abnormal; Notable for the following components:      Result Value   POCT Glucose (KUC) 153 (*)    All other components within normal limits       Discharge Instructions      You have been seen at the [facility] today for chest pain. Your evaluation today was not suggestive of any emergent condition requiring medical intervention at this time. Your chest x-ray and ECG (heart tracing) did not show any worrisome changes. However, some medical problems make take more time to appear. Therefore, it's very important that you pay attention to any new symptoms or worsening of your current condition.  Please proceed directly to the Emergency Department immediately should you feel worse in any way or have any of the following symptoms: increasing or different chest pain, pain that spreads to your arm, neck, jaw, back or abdomen, shortness of breath, or nausea and vomiting.  As we discussed, watch your blood sugars closely while taking prednisone.     Chest pain precautions given. Reviewed expectations re: course of current medical issues. Questions answered. Outlined signs and symptoms indicating  need for more acute intervention. Patient verbalized understanding. After Visit Summary given.   SUBJECTIVE:  History from: patient. Heidi Rivera is a 57 y.o. female who presents with complaint of chest/R side/back pain; intermittent; over past week or so. Always with certain movements. Denies assoc n/v/SOB/diaphoresis. Denies injury. No tx PTA. Denies: fatigue, irregular heart beat, lower extremity edema, near-syncope, orthopnea, palpitations, paroxysmal nocturnal dyspnea, and syncope. Denies recreational drug use.  Social History   Tobacco Use  Smoking Status Never  Smokeless Tobacco Never   Social History   Substance and Sexual Activity  Alcohol Use Not Currently   OBJECTIVE:  Vitals:   05/13/23 0948  BP: 129/85  Pulse: 61  Resp: 18  Temp: 98.1 F (36.7 C)  TempSrc: Oral  SpO2: 96%    General appearance: alert, oriented, no acute distress Eyes: PERRLA; EOMI; conjunctivae normal HENT: normocephalic; atraumatic Neck: supple with FROM Lungs: without labored respirations; speaks full sentences without difficulty; CTAB Heart: regular rate and rhythm without murmer Chest Wall: with tenderness to palpation over chest wall that reproduces described pain in history Abdomen: soft, non-tender; no guarding or rebound tenderness Extremities: without edema; without calf swelling or tenderness; symmetrical without gross deformities Skin: warm and dry; without rash or lesions Neuro: normal gait Psychological: alert and cooperative; normal mood and affect  Labs: Results for orders placed or performed during the hospital encounter of 05/13/23  POC CBG monitoring   Collection Time: 05/13/23 10:48 AM  Result Value Ref Range   POCT Glucose (KUC)  153 (A) 70 - 99 mg/dL   Labs Reviewed  POCT FASTING CBG KUC MANUAL ENTRY - Abnormal; Notable for the following components:      Result Value   POCT Glucose (KUC) 153 (*)    All other components within normal limits     Imaging: No results found.   Allergies  Allergen Reactions   Tramadol Other (See Comments)    Intolerance : GI Upset    Past Medical History:  Diagnosis Date   Acute gastritis    CHRONIC KIDNEY DISEASE STAGE III (MODERATE) 07/11/2008   Qualifier: Diagnosis of  By: Lelon Perla MD, [place]     Chronic left-sided low back pain with left-sided sciatica 07/23/2006   Qualifier: Diagnosis of  By: Bebe Shaggy     Costochondritis, acute 06/17/2019   Diabetes mellitus without complication (HCC)    Dizziness 05/30/2020   Essential hypertension, benign 08/30/2007   Qualifier: Diagnosis of  By: Seleta Rhymes MD, Mark     Gout    Hypertension    Laryngitis 10/13/2019   Migraine without aura 07/11/2008   Qualifier: Diagnosis of  By: Sandi Mealy  MD, Stephanie     Migraines    Obesity    OBESITY, NOS 07/23/2006   Qualifier: Diagnosis of  By: Bebe Shaggy     Sciatica 05/01/2022   Stress incontinence 06/17/2019   Tinea pedis of right foot 07/23/2020   Vertigo    Social History   Socioeconomic History   Marital status: Single    Spouse name: Not on file   Number of children: 3   Years of education: Not on file   Highest education level: Not on file  Occupational History   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   Tobacco Use   Smoking status: Never   Smokeless tobacco: Never  Vaping Use   Vaping status: Never Used  Substance and Sexual Activity   Alcohol use: Not Currently   Drug use: No   Sexual activity: Not Currently    Comment: approx 1 yr  Other Topics Concern   Not on file  Social History Narrative   Lives with sister and her husband who smokes    Social Drivers of Corporate investment banker Strain: Not on file  Food Insecurity: Low Risk  (05/06/2023)   Received from [facility]   Hunger Vital Sign    Worried About Running Out of Food in the Last Year: Never true    Ran Out of Food in the Last Year: Never true  Transportation Needs: No Transportation Needs (05/06/2023)    Received from [facility]    In the past 12 months, has lack of reliable transportation kept you from medical appointments, meetings, work or from getting things needed for daily living? : No  Physical Activity: Not on file  Stress: Not on file  Social Connections: Not on file  Intimate Partner Violence: Not on file   Family History  Problem Relation Age of Onset   Diabetes Mother    Breast cancer Mother    Other Mother        Larynx cancer   Diabetes Maternal Aunt    Hypertension Father    Hypercholesterolemia Father    Other Father        DDD   Cancer - Other Paternal Grandmother        bone   Colon cancer Maternal Uncle    Past Surgical History:  Procedure Laterality Date   HEMORRHOID SURGERY  PERIPHERAL VASCULAR INTERVENTION  04/26/2020   Procedure: PERIPHERAL VASCULAR INTERVENTION;  Surgeon: Runell Gess, MD;  Location: [facility] INVASIVE CV LAB;  Service: Cardiovascular;;  left renal   RENAL ANGIOGRAPHY Right 04/26/2020   Procedure: RENAL ANGIOGRAPHY;  Surgeon: Runell Gess, MD;  Location: [facility] INVASIVE CV LAB;  Service: Cardiovascular;  Laterality: Right;      Mardella Layman, MD 05/13/23 1055

## 2023-05-13 NOTE — ED Triage Notes (Addendum)
C/O intermittent stabbing chest pains to right chest, radiating through to upper back; states pain occasionally is moving to right side of ribcage. Pain worse with deep breathing, palpation, and movements. Denies any cough or cold sxs

## 2023-07-04 ENCOUNTER — Other Ambulatory Visit: Payer: Self-pay | Admitting: Family Medicine

## 2023-07-04 DIAGNOSIS — I1A0 Resistant hypertension: Secondary | ICD-10-CM

## 2023-07-04 DIAGNOSIS — H9201 Otalgia, right ear: Secondary | ICD-10-CM

## 2023-11-01 ENCOUNTER — Other Ambulatory Visit: Payer: Self-pay

## 2023-11-01 ENCOUNTER — Emergency Department (HOSPITAL_COMMUNITY)
Admission: EM | Admit: 2023-11-01 | Discharge: 2023-11-01 | Disposition: A | Discharge status: Home / Self Care | Payer: Self-pay | Attending: Emergency Medicine | Admitting: Emergency Medicine

## 2023-11-01 ENCOUNTER — Encounter (HOSPITAL_COMMUNITY): Payer: Self-pay

## 2023-11-01 ENCOUNTER — Emergency Department (HOSPITAL_COMMUNITY): Payer: Self-pay

## 2023-11-01 DIAGNOSIS — W108XXA Fall (on) (from) other stairs and steps, initial encounter: Secondary | ICD-10-CM | POA: Insufficient documentation

## 2023-11-01 DIAGNOSIS — Z7982 Long term (current) use of aspirin: Secondary | ICD-10-CM | POA: Insufficient documentation

## 2023-11-01 DIAGNOSIS — Y9301 Activity, walking, marching and hiking: Secondary | ICD-10-CM | POA: Insufficient documentation

## 2023-11-01 DIAGNOSIS — Z79899 Other long term (current) drug therapy: Secondary | ICD-10-CM | POA: Insufficient documentation

## 2023-11-01 DIAGNOSIS — Y92009 Unspecified place in unspecified non-institutional (private) residence as the place of occurrence of the external cause: Secondary | ICD-10-CM | POA: Insufficient documentation

## 2023-11-01 DIAGNOSIS — S99912A Unspecified injury of left ankle, initial encounter: Secondary | ICD-10-CM | POA: Insufficient documentation

## 2023-11-01 DIAGNOSIS — W19XXXA Unspecified fall, initial encounter: Secondary | ICD-10-CM

## 2023-11-01 MED ORDER — OXYCODONE HCL 5 MG PO TABS
5.0000 mg | ORAL_TABLET | Freq: Once | ORAL | Status: AC
  Administered 2023-11-01: 5 mg via ORAL
  Filled 2023-11-01: qty 1

## 2023-11-01 NOTE — ED Triage Notes (Signed)
 PT arrives via POV. Pt states she rolled her left ankle when her left leg gave out today. Pt denies any other injuries. Pt AxOx4.

## 2023-11-01 NOTE — Discharge Instructions (Addendum)
 It was a pleasure taking care of you today.  Today we evaluated you after your fall for your left ankle pain.  The x-rays of your left ankle and foot today were negative for acute fracture.  You have been given crutches to assist you with walking.  You were given 1 dose of pain medication here in the emergency department.  Please take Tylenol  at home to help with pain symptoms.  You have been given a referral to orthopedics, please call and make an appointment if your symptoms persist or worsen.  Please return the emergency department or seek other medical care if experiencing the following symptoms including but not limited to severe pain, chest pain, shortness of breath, weakness, dizziness.

## 2023-11-01 NOTE — ED Provider Notes (Signed)
 [facility] DEPART[facility] AT [facility] Provider Note   CSN: [id number] Arrival date & time: 11/01/23  1831     History  Chief Complaint  Patient presents with   Ankle Pain    Heidi Rivera is a 58 y.o. female who presents to the emergency department with a chief complaint of left ankle pain.  Patient states that she rolled her left ankle earlier today when her left leg gave out walking downstairs at her home.  Patient states that roughly 7-8 stairs at her home, and when she got about halfway down her left leg gave out and that she fell rolling her left ankle.  She states that her ankle and leg got caught behind her.  Patient denies being ambulatory after injury.  States that her family members had to help her into the vehicle and that they came straight to the emergency department.  Patient denies hitting her head, denies blood thinners.  Denies loss of consciousness, nausea, vomiting, visual disturbances, dizziness.   Ankle Pain      Home Medications Prior to Admission medications   Medication Sig Start Date End Date Taking? Authorizing Provider  acetaminophen  (TYLENOL ) 500 MG tablet Take 2 tablets (1,000 mg total) by mouth every 8 (eight) hours as needed. 09/11/20   Brimage, Vondra, DO  amLODipine  (NORVASC ) 5 MG tablet Take 1 tablet (5 mg total) by mouth at bedtime. 07/31/22   Santa Cuba, MD  aspirin  81 MG chewable tablet Chew by mouth daily.    [provider]  Blood Pressure Monitoring (BLOOD PRESSURE CUFF) MISC 1 Device by Does not apply route daily. 06/17/19   Mullis, Kiersten P, DO  clopidogrel  (PLAVIX ) 75 MG tablet Take 1 tablet by mouth once daily 12/21/20   Berry, Jonathan J, MD  cyanocobalamin (VITAMIN B12) 1000 MCG tablet Take 1 tablet (1,000 mcg total) by mouth daily. 09/09/22   Edsel Grace, MD  diphenhydrAMINE  (BENADRYL  ALLERGY) 25 mg capsule Take 1 capsule (25 mg total) by mouth every 6 (six) hours as needed. 09/11/20   Brimage, Vondra, DO   empagliflozin  (JARDIANCE ) 10 MG TABS tablet Take 1 tablet (10 mg total) by mouth daily. 09/09/22   Edsel Grace, MD  furosemide  (LASIX ) 40 MG tablet Take 1.5 tablets (60 mg total) by mouth daily. Patient taking differently: Take 60 mg by mouth 2 (two) times daily. 09/11/20 05/13/23  Brimage, Vondra, DO  levalbuterol  (XOPENEX  HFA) 45 MCG/ACT inhaler Inhale 1-2 puffs into the lungs every 6 (six) hours as needed for wheezing. 06/19/21   Mordechai April, DO  pravastatin  (PRAVACHOL ) 40 MG tablet Take 1 tablet (40 mg total) by mouth at bedtime. 08/29/20   Mordechai April, DO  predniSONE  (DELTASONE ) 20 MG tablet Take 2 tablets (40 mg total) by mouth daily. 05/13/23   Afton Albright, MD  Semaglutide (OZEMPIC, 0.25 OR 0.5 MG/DOSE, [place]) Inject into the skin. Took first dose 05/10/23    [provider]  spironolactone (ALDACTONE) 25 MG tablet Take 50 mg by mouth daily. Pt not sure of dose    [provider]  tiZANidine  (ZANAFLEX ) 4 MG tablet Take 1 tablet (4 mg total) by mouth every 6 (six) hours as needed for muscle spasms. 06/26/22   McDiarmid, Demetra Filter, MD  Vitamin D, Ergocalciferol, (DRISDOL) 1.25 MG (50000 UNIT) CAPS capsule Take 50,000 Units by mouth every Monday.  12/22/19   [provider]  fluticasone  (FLONASE ) 50 MCG/ACT nasal spray Place 2 sprays into both nostrils daily. 09/19/18 06/15/19  Marzette Solders  V, PA-C  Galcanezumab -gnlm (EMGALITY ) 120 MG/ML SOAJ Inject 120 mg into the skin every 28 (twenty-eight) days. Patient not taking: Reported on 09/25/2021 03/11/21   Merriam Abbey, DO  rizatriptan  (MAXALT -MLT) 10 MG disintegrating tablet Take 1 tablet at earliest onset of migraine. May repeat in 2 hours if needed. Maximum 2 tablets in 24 hours. Patient not taking: Reported on 09/25/2021 03/11/21   Merriam Abbey, DO      Allergies    Tramadol     Review of Systems   Review of Systems  Musculoskeletal:        Left ankle and foot pain  All other systems reviewed and are negative.   Physical  Exam Updated Vital Signs BP 132/84   Pulse 60   Temp 98.2 F (36.8 C) (Oral)   Resp 16   SpO2 99%  Physical Exam Vitals and nursing note reviewed.  Constitutional:      General: She is not in acute distress.    Appearance: Normal appearance. She is obese. She is not ill-appearing, toxic-appearing or diaphoretic.  HENT:     Head: Normocephalic and atraumatic.  Eyes:     Extraocular Movements: Extraocular movements intact.  Pulmonary:     Effort: Pulmonary effort is normal.  Musculoskeletal:     Cervical back: Normal range of motion.     Comments: Swelling present to lateral left ankle surrounding lateral malleolus, tenderness to palpation diffusely over foot and ankle.  Most tender over the lateral malleolus area, and anterior foot.  Tenderness over Achilles tendon as well.  Sensation of left lower extremity intact, neurovascularly intact  Neurological:     Mental Status: She is alert.    ED Results / Procedures / Treatments   Labs (all labs ordered are listed, but only abnormal results are displayed) Labs Reviewed - No data to display  EKG None  Radiology DG Foot Complete Left Result Date: 11/01/2023 CLINICAL DATA:  Left ankle pain, foot pain. Rolled ankle and foot today EXAM: LEFT FOOT - COMPLETE 3+ VIEW COMPARISON:  None Available. FINDINGS: There is no evidence of fracture or dislocation. There is no evidence of arthropathy or other focal bone abnormality. Soft tissues are unremarkable. IMPRESSION: Negative. Electronically Signed   By: Janeece Mechanic M.D.   On: 11/01/2023 19:17    Procedures Procedures    Medications Ordered in ED Medications  oxyCODONE  (Oxy IR/ROXICODONE ) immediate release tablet 5 mg (5 mg Oral Given 11/01/23 1944)    ED Course/ Medical Decision Making/ A&P    Patient presents to the ED for concern of left ankle pain after fall, this involves an extensive number of treatment options, and is a complaint that carries with it a high risk of  complications and morbidity.  The differential diagnosis includes fracture, dislocation, soft tissue injury, sprain, etc.   Co morbidities that complicate the patient evaluation  None   Imaging Studies ordered:  I ordered imaging studies including x-ray left ankle I independently visualized and interpreted imaging which showed: No acute fracture I agree with the radiologist interpretation   Medicines ordered and prescription drug management:  I ordered medication including oxycodone  for pain Reevaluation of the patient after these medicines showed that the patient improved I have reviewed the patients home medicines and have made adjustments as needed   Test Considered:  None   Critical Interventions:  None   Problem List / ED Course:  Patient presents to the emergency department post fall with left ankle pain On physical exam  left ankle swelling present significant over lateral malleolus and anterior foot, diffusely tender to palpation, left lower extremity neurovascularly intact X-ray left foot ordered, negative today for fracture Patient given 1 dose of pain medication in the emergency department, ordered crutches as well as orthopedic follow-up as needed Return precautions discussed Patient discharged   Reevaluation:  After the interventions noted above, I reevaluated the patient and found that they have :improved   Social Determinants of Health:  none   Dispostion:  After consideration of the diagnostic results and the patients response to treatment, I feel that the patent would benefit from discharge and follow-up with orthopedics as needed.  Return precautions given.  Patient discharged.  Patient instructed to take Tylenol  at home for pain control, rest, ice, compression, elevation.  Use crutches as needed.  Ortho referral as needed.  Click here for ABCD2, HEART and other calculatorsREFRESH Note before signing :1}                              Medical  Decision Making         Final Clinical Impression(s) / ED Diagnoses Final diagnoses:  Left ankle injury, initial encounter  Fall, initial encounter    Rx / DC Orders ED Discharge Orders     None         Fonda Hymen, PA-C 11/01/23 1947    Afton Horse T, DO 11/01/23 2236

## 2023-11-10 ENCOUNTER — Telehealth: Payer: Self-pay

## 2023-11-10 NOTE — Telephone Encounter (Signed)
 Pharmacy Patient Advocate Encounter   Received notification from CoverMyMeds that prior authorization for Ubrelvy  100MG  tablets is required/requested.   Insurance verification completed.   The patient is insured through [facility] .   Per test claim: Patient has not been seen since 2023 and must have an office visit for updated documentation

## 2023-12-21 ENCOUNTER — Ambulatory Visit: Admitting: Podiatry

## 2023-12-21 ENCOUNTER — Ambulatory Visit (INDEPENDENT_AMBULATORY_CARE_PROVIDER_SITE_OTHER)

## 2023-12-21 DIAGNOSIS — S99812A Other specified injuries of left ankle, initial encounter: Secondary | ICD-10-CM

## 2023-12-21 DIAGNOSIS — S99912D Unspecified injury of left ankle, subsequent encounter: Secondary | ICD-10-CM

## 2023-12-23 NOTE — Progress Notes (Signed)
 Subjective:   Patient ID: Heidi Rivera, female   DOB: 58 y.o.   MRN: [id number]   HPI Patient states she rolled her left ankle in June and is still having pain and just wanted it checked she did have x-rays which were negative for fracture and states she gets occasional pain in her right foot also.  Patient does not smoke tries to be active   Review of Systems  All other systems reviewed and are negative.       Objective:  Physical Exam Vitals and nursing note reviewed.  Constitutional:      Appearance: She is well-developed.  Pulmonary:     Effort: Pulmonary effort is normal.  Musculoskeletal:        General: Normal range of motion.  Skin:    General: Skin is warm.  Neurological:     Mental Status: She is alert.     Neurovascular status found to be intact muscle strength is reduced somewhat and especially on the left side where she is splinting and range of motion is reduced inversion eversion with the discomfort  present.  There is moderate edema on the lateral side of the left ankle around the ligaments and mild discomfort and there is discomfort with weightbearing.  Right foot shows just mild discomfort in general with moderate obesity is complicating factor to that     Assessment:  Significant ankle sprain left that is healing but will continue to require time and moderate foot pain right     Plan:  H&P x-rays reviewed and I went ahead today and I advised on compression and elevation ice and stable shoe gear but I think this should heal uneventfully but will probably take approximately 8 more weeks.  I did discuss this with patient and she will be seen back as needed  X-rays indicate no signs of fracture or diastases injury currently

## 2024-01-04 ENCOUNTER — Encounter (HOSPITAL_COMMUNITY): Payer: Self-pay | Admitting: Emergency Medicine

## 2024-01-04 ENCOUNTER — Ambulatory Visit (HOSPITAL_COMMUNITY)
Admission: EM | Admit: 2024-01-04 | Discharge: 2024-01-04 | Disposition: A | Discharge status: Home / Self Care | Attending: Emergency Medicine | Admitting: Emergency Medicine

## 2024-01-04 DIAGNOSIS — H66001 Acute suppurative otitis media without spontaneous rupture of ear drum, right ear: Secondary | ICD-10-CM

## 2024-01-04 DIAGNOSIS — H60391 Other infective otitis externa, right ear: Secondary | ICD-10-CM | POA: Diagnosis not present

## 2024-01-04 MED ORDER — AMOXICILLIN-POT CLAVULANATE 875-125 MG PO TABS: 1.0000 | ORAL_TABLET | Freq: Two times a day (BID) | ORAL | 0 refills | Status: DC

## 2024-01-04 MED ORDER — NEOMYCIN-POLYMYXIN-HC 3.5-10000-1 OT SUSP: 4.0000 [drp] | Freq: Three times a day (TID) | OTIC | 0 refills | Status: DC

## 2024-01-04 NOTE — Discharge Instructions (Signed)
 Start taking Augmentin  twice daily for 7 days for ear infection. Apply 4 drops of Cortisporin 3 times daily for 5 days for additional infection coverage. You can continue taking Tylenol  every 6-8 hours as needed for pain. Follow-up with your primary care provider or return here as needed.

## 2024-01-04 NOTE — ED Triage Notes (Signed)
 Pt reports Saturday her right ear started itching. Was using her 5th finger to scratch in her ear. Reports ear starting hurting. Reports pain yesterday started spreading to behind her ear and neck area. Pt reports this morning noticed drainage from her right ear.

## 2024-01-04 NOTE — ED Provider Notes (Signed)
 [facility]    CSN: [id number] Arrival date & time: 01/04/24  9072      History   Chief Complaint Chief Complaint  Patient presents with   Ear Drainage    HPI Heidi Rivera is a 58 y.o. female.   Patient presents with ear pain that began on 8/9.  Patient states that her ear initially began to itch and she scratched it with her finger and her ear has been hurting since then.  Patient states that the pain is not radiating behind her ear and down her neck some.  Patient states that this morning she did have some drainage from her right ear.  Denies fever, body aches, and chills.  Denies sore throat, headache, congestion, and cough.  The history is provided by the patient and medical records.  Ear Drainage    Past Medical History:  Diagnosis Date   Acute gastritis    CHRONIC KIDNEY DISEASE STAGE III (MODERATE) 07/11/2008   Qualifier: Diagnosis of  By: Zackary MD, Weston     Chronic left-sided low back pain with left-sided sciatica 07/23/2006   Qualifier: Diagnosis of  By: JANEY PARODY     Costochondritis, acute 06/17/2019   Diabetes mellitus without complication (HCC)    Dizziness 05/30/2020   Essential hypertension, benign 08/30/2007   Qualifier: Diagnosis of  By: Xavier MD, Mark     Gout    Hypertension    Laryngitis 10/13/2019   Migraine without aura 07/11/2008   Qualifier: Diagnosis of  By: Flint  MD, Stephanie     Migraines    Obesity    OBESITY, NOS 07/23/2006   Qualifier: Diagnosis of  By: JANEY PARODY     Sciatica 05/01/2022   Stress incontinence 06/17/2019   Tinea pedis of right foot 07/23/2020   Vertigo     Patient Active Problem List   Diagnosis Date Noted   Neuropathy 09/09/2022   Type 2 diabetes mellitus without complication, without long-term current use of insulin (HCC) 09/09/2022   Bilateral tricompartmental osteoarthritis of knees 06/27/2022   Otitis externa 04/12/2021   Statin intolerance 08/22/2020   Hypertension  07/23/2020   Vitamin D deficiency 07/23/2020   Renal artery stenosis (HCC) 04/13/2020   Mixed incontinence urge and stress 06/17/2019   Migraine without aura 07/11/2008   CHRONIC KIDNEY DISEASE STAGE III (MODERATE) 07/11/2008   Morbid obesity with BMI of 45.0-49.9, adult (HCC) 07/23/2006   Chronic left-sided low back pain with left-sided sciatica 07/23/2006    Past Surgical History:  Procedure Laterality Date   HEMORRHOID SURGERY     PERIPHERAL VASCULAR INTERVENTION  04/26/2020   Procedure: PERIPHERAL VASCULAR INTERVENTION;  Surgeon: Court Dorn PARAS, MD;  [facility];  Service: Cardiovascular;;  left renal   RENAL ANGIOGRAPHY Right 04/26/2020   Procedure: RENAL ANGIOGRAPHY;  Surgeon: Court Dorn PARAS, MD;  Location: [facility] INVAS[facility] CV LAB;  Service: Cardiovascular;  Laterality: Right;    OB History   No obstetric history on file.      Home Medications    Prior to Admission medications   Medication Sig Start Date End Date Taking? Authorizing Provider  amoxicillin -clavulanate (AUGMENTIN ) 875-125 MG tablet Take 1 tablet by mouth every 12 (twelve) hours. 01/04/24  Yes Johnie, Edy Belt A, NP  neomycin -polymyxin-hydrocortisone  (CORTISPORIN) 3.5-10000-1 OTIC suspension Place 4 drops into the right ear 3 (three) times daily. 01/04/24  Yes Johnie, Faustina Gebert A, NP  acetaminophen  (TYLENOL ) 500 MG tablet Take 2 tablets (1,000 mg total) by mouth every 8 (eight)  hours as needed. 09/11/20   Brimage, Vondra, DO  amLODipine  (NORVASC ) 5 MG tablet Take 1 tablet (5 mg total) by mouth at bedtime. 07/31/22   Nicholas Bar, MD  aspirin  81 MG chewable tablet Chew by mouth daily.    [provider]  Blood Pressure Monitoring (BLOOD PRESSURE CUFF) MISC 1 Device by Does not apply route daily. 06/17/19   Mullis, Kiersten P, DO  clopidogrel  (PLAVIX ) 75 MG tablet Take 1 tablet by mouth once daily 12/21/20   Berry, Jonathan J, MD  cyanocobalamin (VITAMIN B12) 1000 MCG tablet Take 1 tablet (1,000  mcg total) by mouth daily. 09/09/22   Malvina Ellen, MD  diphenhydrAMINE  (BENADRYL  ALLERGY) 25 mg capsule Take 1 capsule (25 mg total) by mouth every 6 (six) hours as needed. 09/11/20   Brimage, Vondra, DO  empagliflozin  (JARDIANCE ) 10 MG TABS tablet Take 1 tablet (10 mg total) by mouth daily. 09/09/22   Malvina Ellen, MD  furosemide  (LASIX ) 40 MG tablet Take 1.5 tablets (60 mg total) by mouth daily. Patient taking differently: Take 60 mg by mouth 2 (two) times daily. 09/11/20 05/13/23  Brimage, Vondra, DO  levalbuterol  (XOPENEX  HFA) 45 MCG/ACT inhaler Inhale 1-2 puffs into the lungs every 6 (six) hours as needed for wheezing. 06/19/21   Dayna Motto, DO  pravastatin  (PRAVACHOL ) 40 MG tablet Take 1 tablet (40 mg total) by mouth at bedtime. 08/29/20   Dayna Motto, DO  predniSONE  (DELTASONE ) 20 MG tablet Take 2 tablets (40 mg total) by mouth daily. 05/13/23   Rolinda Rogue, MD  Semaglutide (OZEMPIC, 0.25 OR 0.5 MG/DOSE, [place]) Inject into the skin. Took first dose 05/10/23    [provider]  spironolactone (ALDACTONE) 25 MG tablet Take 50 mg by mouth daily. Pt not sure of dose    [provider]  tiZANidine  (ZANAFLEX ) 4 MG tablet Take 1 tablet (4 mg total) by mouth every 6 (six) hours as needed for muscle spasms. 06/26/22   McDiarmid, Krystal BIRCH, MD  Vitamin D, Ergocalciferol, (DRISDOL) 1.25 MG (50000 UNIT) CAPS capsule Take 50,000 Units by mouth every Monday.  12/22/19   [provider]  fluticasone  (FLONASE ) 50 MCG/ACT nasal spray Place 2 sprays into both nostrils daily. 09/19/18 06/15/19  Babara Greig GAILS, PA-C  Galcanezumab -gnlm (EMGALITY ) 120 MG/ML SOAJ Inject 120 mg into the skin every 28 (twenty-eight) days. Patient not taking: Reported on 09/25/2021 03/11/21   Skeet Juliene SAUNDERS, DO  rizatriptan  (MAXALT -MLT) 10 MG disintegrating tablet Take 1 tablet at earliest onset of migraine. May repeat in 2 hours if needed. Maximum 2 tablets in 24 hours. Patient not taking: Reported on 09/25/2021 03/11/21    Skeet Juliene SAUNDERS, DO    Family History Family History  Problem Relation Age of Onset   Diabetes Mother    Breast cancer Mother    Other Mother        Larynx cancer   Diabetes Maternal Aunt    Hypertension Father    Hypercholesterolemia Father    Other Father        DDD   Cancer - Other Paternal Grandmother        bone   Colon cancer Maternal Uncle     Social History Social History   Tobacco Use   Smoking status: Never   Smokeless tobacco: Never  Vaping Use   Vaping status: Never Used  Substance Use Topics   Alcohol use: Not Currently   Drug use: No     Allergies   Tramadol    Review  of Systems Review of Systems  Per HPI  Physical Exam Triage Vital Signs ED Triage Vitals  Encounter Vitals Group     BP 01/04/24 1021 (!) 153/94     Girls Systolic BP Percentile --      Girls Diastolic BP Percentile --      Boys Systolic BP Percentile --      Boys Diastolic BP Percentile --      Pulse Rate 01/04/24 1021 66     Resp 01/04/24 1021 17     Temp 01/04/24 1021 98.4 F (36.9 C)     Temp Source 01/04/24 1021 Oral     SpO2 01/04/24 1021 95 %     Weight --      Height --      Head Circumference --      Peak Flow --      Pain Score 01/04/24 1022 7     Pain Loc --      Pain Education --      Exclude from Growth Chart --    No data found.  Updated Vital Signs BP (!) 153/94 (BP Location: Right Arm)   Pulse 66   Temp 98.4 F (36.9 C) (Oral)   Resp 17   SpO2 95%   Visual Acuity Right Eye Distance:   Left Eye Distance:   Bilateral Distance:    Right Eye Near:   Left Eye Near:    Bilateral Near:     Physical Exam Vitals and nursing note reviewed.  Constitutional:      General: She is awake. She is not in acute distress.    Appearance: Normal appearance. She is well-developed and well-groomed. She is not ill-appearing.  HENT:     Right Ear: Drainage, swelling and tenderness present. Tympanic membrane is erythematous.     Left Ear: Tympanic membrane,  ear canal and external ear normal.     Ears:     Comments: Mild tenderness noted over right mastoid process    Nose: Nose normal.     Mouth/Throat:     Mouth: Mucous membranes are moist.     Pharynx: Oropharynx is clear.  Skin:    General: Skin is warm and dry.  Neurological:     Mental Status: She is alert.  Psychiatric:        Behavior: Behavior is cooperative.      UC Treatments / Results  Labs (all labs ordered are listed, but only abnormal results are displayed) Labs Reviewed - No data to display  EKG   Radiology No results found.  Procedures Procedures (including critical care time)  Medications Ordered in UC Medications - No data to display  Initial Impression / Assessment and Plan / UC Course  I have reviewed the triage vital signs and the nursing notes.  Pertinent labs & imaging results that were available during my care of the patient were reviewed by me and considered in my medical decision making (see chart for details).     Patient is overall well-appearing.  Vitals are stable.  Prescribed Augmentin  for otitis media coverage.  Prescribed Cortisporin for otitis externa coverage.  Discussed follow-up and return precautions. Final Clinical Impressions(s) / UC Diagnoses   Final diagnoses:  Infective otitis externa of right ear  Non-recurrent acute suppurative otitis media of right ear without spontaneous rupture of tympanic membrane     Discharge Instructions      Start taking Augmentin  twice daily for 7 days for ear infection. Apply 4 drops  of Cortisporin 3 times daily for 5 days for additional infection coverage. You can continue taking Tylenol  every 6-8 hours as needed for pain. Follow-up with your primary care provider or return here as needed.   ED Prescriptions     Medication Sig Dispense Auth. Provider   neomycin -polymyxin-hydrocortisone  (CORTISPORIN) 3.5-10000-1 OTIC suspension Place 4 drops into the right ear 3 (three) times daily. 10 mL  Johnie Flaming A, NP   amoxicillin -clavulanate (AUGMENTIN ) 875-125 MG tablet Take 1 tablet by mouth every 12 (twelve) hours. 14 tablet Johnie Flaming A, NP      PDMP not reviewed this encounter.   Johnie Flaming A, NP 01/04/24 1057

## 2024-03-29 ENCOUNTER — Ambulatory Visit: Payer: Self-pay | Admitting: Family Medicine

## 2024-03-29 ENCOUNTER — Ambulatory Visit (INDEPENDENT_AMBULATORY_CARE_PROVIDER_SITE_OTHER): Admitting: Family Medicine

## 2024-03-29 ENCOUNTER — Encounter: Payer: Self-pay | Admitting: Family Medicine

## 2024-03-29 VITALS — BP 177/108 | HR 57 | Ht 69.5 in | Wt 313.8 lb

## 2024-03-29 DIAGNOSIS — E1169 Type 2 diabetes mellitus with other specified complication: Secondary | ICD-10-CM

## 2024-03-29 DIAGNOSIS — I150 Renovascular hypertension: Secondary | ICD-10-CM | POA: Diagnosis not present

## 2024-03-29 DIAGNOSIS — I1A0 Resistant hypertension: Secondary | ICD-10-CM

## 2024-03-29 DIAGNOSIS — E119 Type 2 diabetes mellitus without complications: Secondary | ICD-10-CM

## 2024-03-29 DIAGNOSIS — E785 Hyperlipidemia, unspecified: Secondary | ICD-10-CM

## 2024-03-29 DIAGNOSIS — M79671 Pain in right foot: Secondary | ICD-10-CM

## 2024-03-29 DIAGNOSIS — L309 Dermatitis, unspecified: Secondary | ICD-10-CM

## 2024-03-29 LAB — POCT GLYCOSYLATED HEMOGLOBIN (HGB A1C): HbA1c, POC (controlled diabetic range): 7.3 % — AB (ref 0.0–7.0)

## 2024-03-29 MED ORDER — OZEMPIC (0.25 OR 0.5 MG/DOSE) 2 MG/1.5ML ~~LOC~~ SOPN: 0.2500 mg | PEN_INJECTOR | SUBCUTANEOUS | 1 refills | Status: AC

## 2024-03-29 MED ORDER — SPIRONOLACTONE 25 MG PO TABS: 50.0000 mg | ORAL_TABLET | Freq: Every day | ORAL | 3 refills | Status: AC

## 2024-03-29 MED ORDER — EMPAGLIFLOZIN 10 MG PO TABS: 10.0000 mg | ORAL_TABLET | Freq: Every day | ORAL | 3 refills | Status: AC

## 2024-03-29 MED ORDER — FUROSEMIDE 40 MG PO TABS: 60.0000 mg | ORAL_TABLET | Freq: Every day | ORAL | 3 refills | Status: AC

## 2024-03-29 MED ORDER — PRAVASTATIN SODIUM 40 MG PO TABS: 40.0000 mg | ORAL_TABLET | Freq: Every day | ORAL | 3 refills | Status: AC

## 2024-03-29 MED ORDER — AMLODIPINE BESYLATE 5 MG PO TABS: 5.0000 mg | ORAL_TABLET | Freq: Every day | ORAL | 1 refills | Status: DC

## 2024-03-29 MED ORDER — CLOPIDOGREL BISULFATE 75 MG PO TABS: 75.0000 mg | ORAL_TABLET | Freq: Every day | ORAL | 3 refills | Status: AC

## 2024-03-29 MED ORDER — ASPIRIN 81 MG PO CHEW
81.0000 mg | CHEWABLE_TABLET | Freq: Every day | ORAL | 3 refills | Status: AC
Start: 2024-03-29 — End: ?

## 2024-03-29 MED ORDER — TRIAMCINOLONE ACETONIDE 0.1 % EX CREA: 1.0000 | TOPICAL_CREAM | Freq: Two times a day (BID) | CUTANEOUS | 2 refills | Status: AC

## 2024-03-29 NOTE — Patient Instructions (Signed)
 It was so good to meet you today! Thank you for allowing me to take care of you.  Today we discussed the following concerns and plans:  Rash - I think this could be your eczema.  Mix equal parts triamcinolone cream and Aquaphor and apply this to the affected areas twice daily.  If you do not see any improvement in 1 week please let me know.  Right ankle/heel pain - I have referred you to sports medicine.  They will call you to make an appointment.  I recommend you undergo a mammogram.  You can call to schedule an appointment by calling [phone]. Directions: [address] [place], [place] [address]  I have also referred you to ophthalmology to have your diabetic eye exam.  They should call you to schedule an appointment.  We will check some labs today.  I will let you know the results when they are available.  If you have any concerns, please call the clinic or schedule an appointment.  It was a pleasure to take care of you today. Be well!  Lauraine Norse, DO [facility], PGY-2  Do you need your medications delivered to your home?   We'll send your prescription to the [facility] for delivery.          Address: [address] [place], [place], [place] [address]          Phone: [phone]  Please call the [facility] to speak with a pharmacist and set up your home medication delivery. If you have any questions, feel free to contact us  -- we're happy to help!  Other [facility] that offer affordable prices on both prescriptions and over-the-counter items, as well as convenient services like vaccinations, are  [facility], at [facility]         Address:  [address], [place], [place] [address]         Phone: [phone]  [facility], located in the [facility]        Address: [address] [place], [place] [address]        Phone: [phone]  [facility], at [facility]       Address: [address], [place], [place] [address]       Phone: [phone]  [facility], at [facility]       Address: [address], [place], [place] [address]       Phone: [phone]

## 2024-03-29 NOTE — Progress Notes (Signed)
 New Patient Office Visit  Subjective    Patient ID: Heidi Rivera, female    DOB: March 20, 1966  Age: 58 y.o. MRN: [id number]  CC:  Chief Complaint  Patient presents with   Establish Care    HPI [name] presents to re-establish care. She was previously a patient at this clinic years ago, established care elsewhere, but now has returned. She has had lapse in care and insurance recently and does not have her medications right now. She has been able to keep taking ASA 81 mg. She feels her BP will be up.  HTN She was prescribed amlodipine  5 mg daily, spironolactone 50 mg daily which she reports is working well for her. She does have occasional headaches, though these are not severe. No dizziness, nausea.  Rash Started on the inner left thigh, then appeared on buttocks, and now hands/wrist/forearms. Itchy, dry. Aquaphor x2 daily with some relief. He does have a history of eczema. Has borrowed grandchild's Triamcinolone a couple of times but has not noticed much difference.  R ankle/heel pain Rolled ankle several months ago.  Saw podiatry and had a negative x-ray.  Pain did seem to resolve, but now is back/worse. Pain is primarily in back of right ankle/heel.  He describes it as a burning feeling, sometimes an aching.  It is intermittent. She is still able to walk, though it is uncomfortable. She has been using Voltaren gel without relief.  Outpatient Encounter Medications as of 03/29/2024  Medication Sig   acetaminophen  (TYLENOL ) 500 MG tablet Take 2 tablets (1,000 mg total) by mouth every 8 (eight) hours as needed.   Blood Pressure Monitoring (BLOOD PRESSURE CUFF) MISC 1 Device by Does not apply route daily.   cyanocobalamin (VITAMIN B12) 1000 MCG tablet Take 1 tablet (1,000 mcg total) by mouth daily.   diphenhydrAMINE  (BENADRYL  ALLERGY) 25 mg capsule Take 1 capsule (25 mg total) by mouth every 6 (six) hours as needed.   triamcinolone cream (KENALOG) 0.1 % Apply 1 Application  topically 2 (two) times daily.   Vitamin D, Ergocalciferol, (DRISDOL) 1.25 MG (50000 UNIT) CAPS capsule Take 50,000 Units by mouth every Monday.    [DISCONTINUED] aspirin  81 MG chewable tablet Chew by mouth daily.   amLODipine  (NORVASC ) 5 MG tablet Take 1 tablet (5 mg total) by mouth at bedtime.   amoxicillin -clavulanate (AUGMENTIN ) 875-125 MG tablet Take 1 tablet by mouth every 12 (twelve) hours.   aspirin  81 MG chewable tablet Chew 1 tablet (81 mg total) by mouth daily.   clopidogrel  (PLAVIX ) 75 MG tablet Take 1 tablet (75 mg total) by mouth daily.   empagliflozin  (JARDIANCE ) 10 MG TABS tablet Take 1 tablet (10 mg total) by mouth daily.   furosemide  (LASIX ) 40 MG tablet Take 1.5 tablets (60 mg total) by mouth daily for 90 doses.   levalbuterol  (XOPENEX  HFA) 45 MCG/ACT inhaler Inhale 1-2 puffs into the lungs every 6 (six) hours as needed for wheezing.   neomycin -polymyxin-hydrocortisone  (CORTISPORIN) 3.5-10000-1 OTIC suspension Place 4 drops into the right ear 3 (three) times daily.   pravastatin  (PRAVACHOL ) 40 MG tablet Take 1 tablet (40 mg total) by mouth at bedtime.   predniSONE  (DELTASONE ) 20 MG tablet Take 2 tablets (40 mg total) by mouth daily.   Semaglutide,0.25 or 0.5MG /DOS, (OZEMPIC, 0.25 OR 0.5 MG/DOSE,) 2 MG/1.5ML SOPN Inject 0.25 mg into the skin once a week. Took first dose 05/10/23   spironolactone (ALDACTONE) 25 MG tablet Take 2 tablets (50 mg total) by mouth daily. Pt not sure  of dose   tiZANidine  (ZANAFLEX ) 4 MG tablet Take 1 tablet (4 mg total) by mouth every 6 (six) hours as needed for muscle spasms.   [DISCONTINUED] amLODipine  (NORVASC ) 5 MG tablet Take 1 tablet (5 mg total) by mouth at bedtime. (Patient not taking: Reported on 03/29/2024)   [DISCONTINUED] clopidogrel  (PLAVIX ) 75 MG tablet Take 1 tablet by mouth once daily   [DISCONTINUED] empagliflozin  (JARDIANCE ) 10 MG TABS tablet Take 1 tablet (10 mg total) by mouth daily. (Patient not taking: Reported on 03/29/2024)    [DISCONTINUED] fluticasone  (FLONASE ) 50 MCG/ACT nasal spray Place 2 sprays into both nostrils daily.   [DISCONTINUED] furosemide  (LASIX ) 40 MG tablet Take 1.5 tablets (60 mg total) by mouth daily. (Patient not taking: Reported on 03/29/2024)   [DISCONTINUED] Galcanezumab -gnlm (EMGALITY ) 120 MG/ML SOAJ Inject 120 mg into the skin every 28 (twenty-eight) days. (Patient not taking: Reported on 09/25/2021)   [DISCONTINUED] pravastatin  (PRAVACHOL ) 40 MG tablet Take 1 tablet (40 mg total) by mouth at bedtime.   [DISCONTINUED] rizatriptan  (MAXALT -MLT) 10 MG disintegrating tablet Take 1 tablet at earliest onset of migraine. May repeat in 2 hours if needed. Maximum 2 tablets in 24 hours. (Patient not taking: Reported on 09/25/2021)   [DISCONTINUED] Semaglutide (OZEMPIC, 0.25 OR 0.5 MG/DOSE, [place]) Inject into the skin. Took first dose 05/10/23 (Patient not taking: Reported on 03/29/2024)   [DISCONTINUED] spironolactone (ALDACTONE) 25 MG tablet Take 50 mg by mouth daily. Pt not sure of dose (Patient not taking: Reported on 03/29/2024)   No facility-administered encounter medications on file as of 03/29/2024.    Past Medical History:  Diagnosis Date   Acute gastritis    CHRONIC KIDNEY DISEASE STAGE III (MODERATE) 07/11/2008   Qualifier: Diagnosis of  By: Zackary MD, Weston     Chronic left-sided low back pain with left-sided sciatica 07/23/2006   Qualifier: Diagnosis of  By: JANEY PARODY     Costochondritis, acute 06/17/2019   Diabetes mellitus without complication (HCC)    Dizziness 05/30/2020   Essential hypertension, benign 08/30/2007   Qualifier: Diagnosis of  By: Xavier MD, Mark     Gout    Hypertension    Laryngitis 10/13/2019   Migraine without aura 07/11/2008   Qualifier: Diagnosis of  By: Flint  MD, Stephanie     Migraines    Obesity    OBESITY, NOS 07/23/2006   Qualifier: Diagnosis of  By: JANEY PARODY     Sciatica 05/01/2022   Stress incontinence 06/17/2019   Tinea pedis of right  foot 07/23/2020   Vertigo     Past Surgical History:  Procedure Laterality Date   HEMORRHOID SURGERY     PERIPHERAL VASCULAR INTERVENTION  04/26/2020   Procedure: PERIPHERAL VASCULAR INTERVENTION;  Surgeon: Court Dorn PARAS, MD;  [facility]: [facility];  Service: Cardiovascular;;  left renal   RENAL ANGIOGRAPHY Right 04/26/2020   Procedure: RENAL ANGIOGRAPHY;  Surgeon: Court Dorn PARAS, MD;  Location: [facility] INVASIVE CV LAB;  Service: Cardiovascular;  Laterality: Right;    Family History  Problem Relation Age of Onset   Diabetes Mother    Breast cancer Mother    Other Mother        Larynx cancer   Diabetes Maternal Aunt    Hypertension Father    Hypercholesterolemia Father    Other Father        DDD   Cancer - Other Paternal Grandmother        bone   Colon cancer Maternal Uncle  Social History   Socioeconomic History   Marital status: Single    Spouse name: Not on file   Number of children: 3   Years of education: Not on file   Highest education level: Not on file  Occupational History   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   Tobacco Use   Smoking status: Never   Smokeless tobacco: Never  Vaping Use   Vaping status: Never Used  Substance and Sexual Activity   Alcohol use: Not Currently   Drug use: No   Sexual activity: Not Currently    Comment: approx 1 yr  Other Topics Concern   Not on file  Social History Narrative   Lives with sister and her husband who smokes    Social Drivers of Corporate Investment Banker Strain: Not on file  Food Insecurity: Medium Risk (12/09/2023)   Received from [facility]   Hunger Vital Sign    Within the past 12 months, you worried that your food would run out before you got money to buy more: Patient declined to answer    Within the past 12 months, the food you bought just didn't last and you didn't have money to get more. : Sometimes true  Transportation Needs: No Transportation Needs (12/09/2023)   Received from [employer]    In the past 12 months, has lack of reliable transportation kept you from medical appointments, meetings, work or from getting things needed for daily living? : No  Physical Activity: Not on file  Stress: Not on file  Social Connections: Not on file  Intimate Partner Violence: Not on file    Objective    BP (!) 177/108   Pulse (!) 57   Ht 5' 9.5 (1.765 m)   Wt (!) 313 lb 12.8 oz (142.3 kg)   SpO2 100%   BMI 45.68 kg/m   Physical Exam Vitals and nursing note reviewed.  Constitutional:      General: She is not in acute distress. HENT:     Head: Normocephalic.     Nose: Nose normal.     Mouth/Throat:     Mouth: Mucous membranes are moist.     Pharynx: Oropharynx is clear.  Eyes:     Extraocular Movements: Extraocular movements intact.     Conjunctiva/sclera: Conjunctivae normal.     Pupils: Pupils are equal, round, and reactive to light.  Cardiovascular:     Rate and Rhythm: Normal rate and regular rhythm.     Pulses: Normal pulses.     Heart sounds: No murmur heard. Pulmonary:     Effort: Pulmonary effort is normal.     Breath sounds: Normal breath sounds.  Abdominal:     General: Abdomen is flat.     Palpations: Abdomen is soft.  Musculoskeletal:        General: Tenderness present. No swelling. Normal range of motion.     Cervical back: Normal range of motion and neck supple.     Right foot: Normal range of motion.  Feet:     Right foot:     Skin integrity: Skin integrity normal. No ulcer or warmth.     Comments: Right foot + Calcaneal squeeze test Skin:    General: Skin is warm and dry.     Capillary Refill: Capillary refill takes less than 2 seconds.     Findings: Rash present.     Comments: Dry, hyperpigmented areas scattered on inner left thigh, right forearm, particularly in elbow  crease.  Neurological:     General: No focal deficit present.     Mental Status: She is alert and oriented to person, place, and time.     Gait: Gait  normal.  Psychiatric:        Mood and Affect: Mood normal.        Behavior: Behavior normal.       Assessment & Plan:   Problem List Items Addressed This Visit       Cardiovascular and Mediastinum   Hypertension   Relevant Medications   amLODipine  (NORVASC ) 5 MG tablet   aspirin  81 MG chewable tablet   furosemide  (LASIX ) 40 MG tablet   pravastatin  (PRAVACHOL ) 40 MG tablet   spironolactone (ALDACTONE) 25 MG tablet   Other Relevant Orders   POCT glycosylated hemoglobin (Hb A1C) (Completed)   Microalbumin / creatinine urine ratio   Basic Metabolic Panel   Lipid panel   POCT glycosylated hemoglobin (Hb A1C) (Completed)   Microalbumin / creatinine urine ratio   Basic Metabolic Panel   Lipid panel     Endocrine   Type 2 diabetes mellitus without complication, without long-term current use of insulin (HCC) - Primary   Relevant Medications   aspirin  81 MG chewable tablet   empagliflozin  (JARDIANCE ) 10 MG TABS tablet   pravastatin  (PRAVACHOL ) 40 MG tablet   Semaglutide,0.25 or 0.5MG /DOS, (OZEMPIC, 0.25 OR 0.5 MG/DOSE,) 2 MG/1.5ML SOPN   Other Relevant Orders   POCT glycosylated hemoglobin (Hb A1C) (Completed)   Microalbumin / creatinine urine ratio   Basic Metabolic Panel   Lipid panel   MM 3D SCREENING MAMMOGRAM BILATERAL BREAST   Ambulatory referral to Ophthalmology   Other Visit Diagnoses       Hyperlipidemia associated with type 2 diabetes mellitus (HCC)       Relevant Medications   amLODipine  (NORVASC ) 5 MG tablet   aspirin  81 MG chewable tablet   empagliflozin  (JARDIANCE ) 10 MG TABS tablet   furosemide  (LASIX ) 40 MG tablet   pravastatin  (PRAVACHOL ) 40 MG tablet   Semaglutide,0.25 or 0.5MG /DOS, (OZEMPIC, 0.25 OR 0.5 MG/DOSE,) 2 MG/1.5ML SOPN   spironolactone (ALDACTONE) 25 MG tablet   Other Relevant Orders   POCT glycosylated hemoglobin (Hb A1C) (Completed)   Microalbumin / creatinine urine ratio   Basic Metabolic Panel   Lipid panel     Eczema, unspecified  type         Pain of right heel       Relevant Orders   Ambulatory referral to Sports Medicine       Follow up in 2 weeks to recheck BP, ensure she has been able to obtain and take all medications appropriately.  Lauraine Norse, DO

## 2024-03-30 LAB — BASIC METABOLIC PANEL WITH GFR
BUN/Creatinine Ratio: 8 — ABNORMAL LOW (ref 9–23)
BUN: 20 mg/dL (ref 6–24)
CO2: 24 mmol/L (ref 20–29)
Calcium: 9.7 mg/dL (ref 8.7–10.2)
Chloride: 102 mmol/L (ref 96–106)
Creatinine, Ser: 2.4 mg/dL — ABNORMAL HIGH (ref 0.57–1.00)
Glucose: 90 mg/dL (ref 70–99)
Potassium: 4.2 mmol/L (ref 3.5–5.2)
Sodium: 141 mmol/L (ref 134–144)
eGFR: 23 mL/min/1.73 — ABNORMAL LOW (ref >59)

## 2024-03-30 LAB — MICROALBUMIN / CREATININE URINE RATIO
Creatinine, Urine: 191.9 mg/dL
Microalb/Creat Ratio: 102 mg/g{creat} — ABNORMAL HIGH (ref 0–29)
Microalbumin, Urine: 196.6 ug/mL

## 2024-03-30 LAB — LIPID PANEL
Chol/HDL Ratio: 3.4 ratio (ref 0.0–4.4)
Cholesterol, Total: 208 mg/dL — ABNORMAL HIGH (ref 100–199)
HDL: 61 mg/dL (ref >39)
LDL Chol Calc (NIH): 122 mg/dL — ABNORMAL HIGH (ref 0–99)
Triglycerides: 145 mg/dL (ref 0–149)
VLDL Cholesterol Cal: 25 mg/dL (ref 5–40)

## 2024-03-31 ENCOUNTER — Other Ambulatory Visit (HOSPITAL_COMMUNITY): Payer: Self-pay

## 2024-03-31 ENCOUNTER — Telehealth: Payer: Self-pay

## 2024-03-31 NOTE — Telephone Encounter (Signed)
 Prior authorization submitted for OZEMPIC 0.25/0.5MG  to [employer] via Lat[employer].   Key: [id number]

## 2024-04-01 ENCOUNTER — Other Ambulatory Visit (HOSPITAL_COMMUNITY): Payer: Self-pay

## 2024-04-01 NOTE — Telephone Encounter (Signed)
 Pharmacy Patient Advocate Encounter  Received notification from [facility] [employer] that Prior Authorization for OZEMPIC 0.25/0.5MG  has been APPROVED from 03/31/24 to 03/31/25   PA #/Case ID/Reference #: [id number]

## 2024-04-12 ENCOUNTER — Ambulatory Visit (INDEPENDENT_AMBULATORY_CARE_PROVIDER_SITE_OTHER): Admitting: Family Medicine

## 2024-04-12 ENCOUNTER — Encounter: Payer: Self-pay | Admitting: Family Medicine

## 2024-04-12 ENCOUNTER — Other Ambulatory Visit: Payer: Self-pay

## 2024-04-12 VITALS — BP 156/92 | Ht 69.5 in | Wt 313.0 lb

## 2024-04-12 DIAGNOSIS — M2142 Flat foot [pes planus] (acquired), left foot: Secondary | ICD-10-CM

## 2024-04-12 DIAGNOSIS — M79671 Pain in right foot: Secondary | ICD-10-CM

## 2024-04-12 DIAGNOSIS — N1832 Chronic kidney disease, stage 3b: Secondary | ICD-10-CM

## 2024-04-12 DIAGNOSIS — M7661 Achilles tendinitis, right leg: Secondary | ICD-10-CM | POA: Diagnosis not present

## 2024-04-12 DIAGNOSIS — M2141 Flat foot [pes planus] (acquired), right foot: Secondary | ICD-10-CM

## 2024-04-12 DIAGNOSIS — I10 Essential (primary) hypertension: Secondary | ICD-10-CM

## 2024-04-12 DIAGNOSIS — M7751 Other enthesopathy of right foot: Secondary | ICD-10-CM

## 2024-04-12 NOTE — Progress Notes (Signed)
 DATE OF VISIT: 04/12/2024        Heidi Rivera DOB: February 06, 1966 MRN: [id number]  Discussed the use of AI scribe software for clinical note transcription with the patient, who gave verbal consent to proceed.  History of Present Illness Heidi Rivera is a 58 year old female with chronic kidney disease and diabetes with neuropathy who presents with right foot and ankle pain.  Right foot and ankle pain - Pain began after an injury in June 2025 - had a fall and injured left ankle at the time - Initially asymptomatic, but developed soreness, pain, and burning sensations a few weeks post-injury - Pain is localized along the back and inside of the right foot and ankle near the heel - Occasional burning sensations that can wake her from sleep - Pain is present even when not weight-bearing - Pain impairs ambulation and stair climbing - Pain interferes with work-related tasks - No abnormalities on right foot x-ray performed by a foot specialist - No issues with the left foot or ankle since the initial injury - Uses Voltaren gel for temporary relief - Unable to use ibuprofen  due to chronic kidney disease  Neuropathic symptoms - History of diabetes with neuropathy - currently on Lyrica  - Current right foot and ankle pain is distinct from her usual neuropathy - Diabetic neuropathy typically involves burning in the front of the foot and cramping of the toes - Last hemoglobin A1c was 7.3  Pes planus (flat feet) and footwear - Has flat feet bilaterally - Family history of flat feet (father and granddaughter) - Has not used custom orthotics; only over-the-counter options such as Dr. Heriberto - Typically wears slip-on type shoes similar to Crocs to avoid aggravating heel pain  Other history: - History of headaches or migraines    Medications:  Outpatient Encounter Medications as of 04/12/2024  Medication Sig   acetaminophen  (TYLENOL ) 500 MG tablet Take 2 tablets (1,000 mg total) by mouth  every 8 (eight) hours as needed.   amLODipine  (NORVASC ) 5 MG tablet Take 1 tablet (5 mg total) by mouth at bedtime.   amoxicillin -clavulanate (AUGMENTIN ) 875-125 MG tablet Take 1 tablet by mouth every 12 (twelve) hours.   aspirin  81 MG chewable tablet Chew 1 tablet (81 mg total) by mouth daily.   Blood Pressure Monitoring (BLOOD PRESSURE CUFF) MISC 1 Device by Does not apply route daily.   clopidogrel  (PLAVIX ) 75 MG tablet Take 1 tablet (75 mg total) by mouth daily.   cyanocobalamin (VITAMIN B12) 1000 MCG tablet Take 1 tablet (1,000 mcg total) by mouth daily.   diphenhydrAMINE  (BENADRYL  ALLERGY) 25 mg capsule Take 1 capsule (25 mg total) by mouth every 6 (six) hours as needed.   empagliflozin  (JARDIANCE ) 10 MG TABS tablet Take 1 tablet (10 mg total) by mouth daily.   furosemide  (LASIX ) 40 MG tablet Take 1.5 tablets (60 mg total) by mouth daily for 90 doses.   levalbuterol  (XOPENEX  HFA) 45 MCG/ACT inhaler Inhale 1-2 puffs into the lungs every 6 (six) hours as needed for wheezing.   neomycin -polymyxin-hydrocortisone  (CORTISPORIN) 3.5-10000-1 OTIC suspension Place 4 drops into the right ear 3 (three) times daily.   pravastatin  (PRAVACHOL ) 40 MG tablet Take 1 tablet (40 mg total) by mouth at bedtime.   predniSONE  (DELTASONE ) 20 MG tablet Take 2 tablets (40 mg total) by mouth daily.   Semaglutide,0.25 or 0.5MG /DOS, (OZEMPIC, 0.25 OR 0.5 MG/DOSE,) 2 MG/1.5ML SOPN Inject 0.25 mg into the skin once a week. Took first dose 05/10/23   spironolactone (ALDACTONE)  25 MG tablet Take 2 tablets (50 mg total) by mouth daily. Pt not sure of dose   tiZANidine  (ZANAFLEX ) 4 MG tablet Take 1 tablet (4 mg total) by mouth every 6 (six) hours as needed for muscle spasms.   triamcinolone  cream (KENALOG ) 0.1 % Apply 1 Application topically 2 (two) times daily.   Vitamin D, Ergocalciferol, (DRISDOL) 1.25 MG (50000 UNIT) CAPS capsule Take 50,000 Units by mouth every Monday.    [DISCONTINUED] fluticasone  (FLONASE ) 50 MCG/ACT  nasal spray Place 2 sprays into both nostrils daily.   [DISCONTINUED] Galcanezumab -gnlm (EMGALITY ) 120 MG/ML SOAJ Inject 120 mg into the skin every 28 (twenty-eight) days. (Patient not taking: Reported on 09/25/2021)   [DISCONTINUED] rizatriptan  (MAXALT -MLT) 10 MG disintegrating tablet Take 1 tablet at earliest onset of migraine. May repeat in 2 hours if needed. Maximum 2 tablets in 24 hours. (Patient not taking: Reported on 09/25/2021)   No facility-administered encounter medications on file as of 04/12/2024.    Allergies: is allergic to tramadol .  Physical Examination: Vitals: BP (!) 156/92   Ht 5' 9.5 (1.765 m)   Wt (!) 313 lb (142 kg)   BMI 45.56 kg/m  GENERAL:  Heidi Rivera is a 58 y.o. female appearing their stated age, alert and oriented x 3, in no apparent distress.  SKIN: no rashes or lesions, skin clean, dry, intact MSK: Foot/ankle: Bilateral pes planus.  Right foot and ankle without swelling or effusion.  Full range of motion with some pain along the Achilles with terminal dorsiflexion and plantarflexion.  Tender to palpation along the distal Achilles near insertion on the calcaneus.  No palpable defects.  No tenderness along the plantar fascia.  No tenderness along the midfoot or the forefoot.  Ankle strength 5/5 with some pain with resisted ankle dorsiflexion and plantarflexion. Left foot and ankle without swelling or effusion.  Full range of motion without pain.  No bony tenderness.  No tenderness along the Achilles.  Ankle strength 5/5 Gait: Light overpronation bilaterally NEURO: sensation intact to light touch lower extremity bilaterally VASC: pulses 2+ and symmetric DP/PT bilaterally, no edema  Radiology: Limited MSK ultrasound right foot/ankle Date: 04/12/2024 Indication: Right heel/medial ankle pain Findings: - Achilles with hypoechoic change and noticeable thickening at the insertion on the calcaneus.  Associated calcaneal heel spur.  No significant and increased  Doppler flow.  Small Achilles thickness approximately 9.6 mm near the insertion. - Increased fluid noted in the retrocalcaneal bursa - Medial ankle with small amount of anechoic fluid around the posterior tibialis tendon.  No other abnormalities.  Normal-appearing flexor hallucis and flexor digitorum.  Normal neurovascular bundle  Impression: - Achilles tendinopathy with associated heel spur and small areas of partial tearing - Retrocalcaneal bursitis Images and interpretation completed by Rainell Cedar, DO   Assessment & Plan Right Achilles tendinopathy with partial tearing and heel spur Chronic tendinopathy with partial tearing and heel spur along with retrocalcaneal bursitis, confirmed by ultrasound.  Dems likely aggravated by underlying diabetic neuropathy, but symptoms are distinct.  - MSK ultrasound completed as noted above kind of findings reviewed with patient during visit today - Treatment options discussed.  Would be candidate for nitroglycerin  therapy, but she has history of migraine headaches, therefore this is contraindicated - Continue with Voltaren gel as needed - Provided home exercise handout for eccentric heel drop program - Advised supportive shoes, avoid barefoot walking. - Consider custom orthotics in the future for added support and stability - Discuss shockwave therapy if symptoms persist.  She is  aware of potential out-of-pocket cost of $75 a session and expectation of likely 4-6 sessions - Follow-up 6 weeks for reevaluation, sooner as needed  Bilateral pes planus Contributes to altered gait and stress on Achilles tendon. No prior custom orthotics use. - Recommended shoes with arch support. - Discussed custom orthotics in the future if has ongoing issues with the Achilles  Chronic kidney disease stage III - Unable to take oral NSAIDs due to this - Okay to continue with topical NSAIDs as needed  Hypertension - Blood pressure significant elevated during visit today,  she notes that she is currently out of her blood pressure medications and will be picking those up at the pharmacy soon.  Also increased pain which is likely elevating her blood pressure - Repeat blood pressure was improved, but still elevated - Should follow-up with PCP as scheduled.  Start taking blood pressure medications as soon as possible     Patient expressed understanding & agreement with above.  Encounter Diagnoses  Name Primary?   Insertional tendinopathy of right Achilles tendon Yes   Retrocalcaneal bursitis, right    Bilateral pes planus    Stage 3b chronic kidney disease (HCC)    Hypertension, unspecified type     Orders Placed This Encounter  Procedures   US  LIMITED JOINT SPACE STRUCTURES LOW RIGHT     Contains text generated by [employer].

## 2024-04-12 NOTE — Patient Instructions (Signed)
  VISIT SUMMARY: Today, we discussed your right foot and ankle pain, which began after an injury in June 2025. We reviewed your symptoms, including soreness, pain, and burning sensations, and how they affect your daily activities. We also talked about your history of diabetes and flat feet, and how these conditions might be contributing to your current issues.  YOUR PLAN: -RIGHT ACHILLES TENDINOPATHY WITH PARTIAL TEARING AND HEEL SPUR: This condition involves chronic inflammation and partial tearing of the Achilles tendon, along with a heel spur. We confirmed this with an ultrasound. You should continue using Voltaren gel for pain relief, wear supportive shoes, and avoid walking barefoot. I provided you with a home exercise handout to help manage the pain. If your symptoms persist, we can discuss shockwave therapy. Custom shoe inserts may also be beneficial if they are covered by your insurance or if you choose to pay for them yourself.  -RIGHT ACQUIRED FLAT FOOT (PES PLANUS): This condition means that your foot arch has flattened, which can alter your gait and put extra stress on your Achilles tendon. I recommend wearing shoes with good arch support. We also discussed the possibility of using custom orthotics if they are covered by your insurance or if you choose to pay for them yourself.  INSTRUCTIONS: Please follow the home exercise routine provided and continue using Voltaren gel for pain relief. Wear supportive shoes and avoid walking barefoot. If your symptoms do not improve, we can discuss further treatment options such as shockwave therapy. Consider custom shoe inserts if they are covered by your insurance or if you choose to pay for them yourself. Follow up with me if your pain persists or worsens.

## 2024-04-15 ENCOUNTER — Ambulatory Visit: Payer: Self-pay | Admitting: Family Medicine

## 2024-04-29 ENCOUNTER — Encounter: Payer: Self-pay | Admitting: Student

## 2024-04-29 ENCOUNTER — Ambulatory Visit: Admitting: Student

## 2024-04-29 VITALS — BP 190/103 | HR 63 | Ht 69.5 in | Wt 312.6 lb

## 2024-04-29 DIAGNOSIS — G8929 Other chronic pain: Secondary | ICD-10-CM

## 2024-04-29 DIAGNOSIS — M5442 Lumbago with sciatica, left side: Secondary | ICD-10-CM

## 2024-04-29 DIAGNOSIS — I1A0 Resistant hypertension: Secondary | ICD-10-CM

## 2024-04-29 MED ORDER — GABAPENTIN 100 MG PO CAPS: 100.0000 mg | ORAL_CAPSULE | Freq: Three times a day (TID) | ORAL | 3 refills | Status: AC

## 2024-04-29 MED ORDER — AMLODIPINE BESYLATE 10 MG PO TABS: 5.0000 mg | ORAL_TABLET | Freq: Every day | ORAL | 0 refills | Status: AC

## 2024-04-29 NOTE — Assessment & Plan Note (Signed)
 Pain appears to be acute. She denies having severe back pain prior to 3 weeks ago.  Order Lumbar DG  Exercises for suspected bulging discs  She has been participating in home therapy exercises for the last 3 weeks at least 5 times per week. Continue 5 day per week strengthening exercises at home for another 3 weeks  Prescribed gabapentin  100 mg TID for neuropathic pain  Consider repeat MRI if no improvement from above interventions

## 2024-04-29 NOTE — Assessment & Plan Note (Addendum)
 BP above goal today. Denies neurological symptoms  Previously unable to tolerate 10 mg amlodipine  due to migraines  She will try to take the 10 mg amlodipine  today, will follow up with PCP for BP medication titration  Suspect she will need additional support with her worsening kidney function.  Patient to follow up in 1 week with PCP, she will schedule appointment. Will route draft to staff for scheduling

## 2024-04-29 NOTE — Progress Notes (Addendum)
    SUBJECTIVE:   CHIEF COMPLAINT / HPI:   Heidi Rivera is a 58 y.o. female presenting for acute on chronic back pain.   - Severe back pain radiating down the left leg, occasionally affecting both sides - Pain worsens after sitting for any period - Slight improvement with movement - Falls experienced when getting up at night - Acute exacerbation of chronic pain - Lumbar spine imaging in September 2021 showed mild multilevel degenerative changes, disc bulging, and facet arthropathy - Uses hot water bottle and Voltaren gel without significant relief - Tizanidine  4 mg every six hours for pain without improvement - Attempted exercises for suspected sciatica without improvement - No use of gabapentin  or Lyrica   PERTINENT  PMH / PSH: reviewed and updated.  OBJECTIVE:   BP (!) 190/103   Pulse 63   Ht 5' 9.5 (1.765 m)   Wt (!) 312 lb 9.6 oz (141.8 kg)   SpO2 99%   BMI 45.50 kg/m   Well-appearing, no acute distress Cardio: Regular rate, regular rhythm, no murmurs on exam. Pulm: Clear, no wheezing, no crackles. No increased work of breathing Abdominal: bowel sounds present, soft, non-tender, non-distended Extremities: no peripheral edema  MSK:  No acute abnormality on inspection  Pain with palpation over the lower thoracic and lumbar spinous processes  LE strength 5/5 bilaterally  ROM limited 2/2 to pain  Pedal pulses +2    ASSESSMENT/PLAN:   Assessment & Plan Chronic left-sided low back pain with left-sided sciatica Pain appears to be acute. She denies having severe back pain prior to 3 weeks ago.  Order Lumbar DG  Exercises for suspected bulging discs  She has been participating in home therapy exercises for the last 3 weeks at least 5 times per week. Continue 5 day per week strengthening exercises at home for another 3 weeks  Prescribed gabapentin  100 mg TID for neuropathic pain  Consider repeat MRI if no improvement from above interventions  Resistant  hypertension BP above goal today. Denies neurological symptoms  Previously unable to tolerate 10 mg amlodipine  due to migraines  She will try to take the 10 mg amlodipine  today, will follow up with PCP for BP medication titration  Suspect she will need additional support with her worsening kidney function.  Patient to follow up in 1 week with PCP, she will schedule appointment. Will route draft to staff for scheduling      Damien Pinal, DO [facility]

## 2024-04-29 NOTE — Patient Instructions (Addendum)
 It was great to see you today!   Please follow up with your PCP in 1 week to recheck your blood pressure. I am increasing your dose to 10 mg tablets. If you have 5 mg tablets at home you can take 2 5 mg tablets before starting the new prescription.   I have ordered an x-ray you can get this across [facility] at the following address:  [facility] [address]  [phone] You do not need an appointment.    I have sent in a prescription for gabapentin  100 mg tablets, you can take this 3 times per day. If you need to take more medication at night, you can take 200-300 mg at night before bed.   Your blood pressure has remained elevated, continue taking amlodipine    Future Appointments  Date Time Provider Department Center  05/05/2024 12:00 PM [facility] 2 [facility]  05/24/2024 11:00 AM Teressa Rainell BROCKS, DO [facility]    Please arrive 15 minutes before your appointment to ensure smooth check in process.  If you are more than 15 minutes late, you may be asked to reschedule.   Please bring a list of your medications with you to all appointments.   Please call the clinic at [phone] if your symptoms worsen or you have any concerns.  Thank you for allowing me to participate in your care, Dr. Damien Pinal [facility]

## 2024-05-05 ENCOUNTER — Ambulatory Visit

## 2024-05-06 ENCOUNTER — Ambulatory Visit: Admitting: Family Medicine

## 2024-05-06 NOTE — Assessment & Plan Note (Deleted)
 Add second agent olmesartan?  Repeat BMP today

## 2024-05-06 NOTE — Assessment & Plan Note (Deleted)
 Referral to nephrology.

## 2024-05-06 NOTE — Assessment & Plan Note (Deleted)
 Diabetic kidney evaluation with BMP due

## 2024-05-06 NOTE — Progress Notes (Deleted)
° ° °  SUBJECTIVE:   CHIEF COMPLAINT / HPI:   BP follow up She was seen in this clinic 12/5 with elevated blood pressure. At that time she was not taking amlodipine  2/2 migraines. Encouraged to trial amlodipine  10 mg. Since then she has *** been taking amlodipine  10 mg daily.   12/5: Resistant hypertension BP above goal today. Denies neurological symptoms  Previously unable to tolerate 10 mg amlodipine  due to migraines  She will try to take the 10 mg amlodipine  today, will follow up with PCP for BP medication titration  Suspect she will need additional support with her worsening kidney function.  Patient to follow up in 1 week with PCP, she will schedule appointment. Will route draft to staff for scheduling     PERTINENT  PMH / PSH: Reviewed. T2DM with neuropathy, CKD stage III, migraine, obesity.  OBJECTIVE:   There were no vitals taken for this visit.  General: ***-appearing, no acute distress. HEENT: normocephalic, PERRLA, EOM grossly intact, MMM, bilateral TM visualized without erythema or bulging. Cardio: Regular rate, *** rhythm, no murmurs on exam. Pulm: Clear, no wheezing, no crackles. No increased work of breathing. Abdominal: bowel sounds present, soft, non-tender, non-distended. No HSM. Extremities: no peripheral edema. Moves all extremities equally. Neuro: Alert and oriented x3, speech normal in content, no facial asymmetry, strength intact and equal bilaterally in UE and LE, pupils equal and reactive to light.  Psych:  Cognition and judgment appear intact. Alert, communicative, and cooperative with normal attention span and concentration. No apparent delusions, illusions, hallucinations    ASSESSMENT/PLAN:   Assessment & Plan Resistant hypertension Add second agent olmesartan?  Repeat BMP today Type 2 diabetes mellitus without complication, without long-term current use of insulin (HCC) Diabetic kidney evaluation with BMP due Stage 3b chronic kidney disease  (HCC) Referral to nephrology? Morbid obesity with BMI of 45.0-49.9, adult (HCC)      Lauraine Norse, DO [facility]

## 2024-05-24 ENCOUNTER — Ambulatory Visit: Payer: Self-pay | Admitting: Family Medicine
